# Patient Record
Sex: Female | Born: 1945 | Race: Black or African American | Hispanic: No | Marital: Single | Smoking: Never smoker
Health system: Southern US, Community
[De-identification: ages and names within clinical notes are randomized; demographics above are authoritative.]

## PROBLEM LIST (undated history)

## (undated) DIAGNOSIS — I10 Essential (primary) hypertension: Secondary | ICD-10-CM

## (undated) DIAGNOSIS — R5383 Other fatigue: Secondary | ICD-10-CM

## (undated) DIAGNOSIS — E782 Mixed hyperlipidemia: Secondary | ICD-10-CM

## (undated) DIAGNOSIS — I509 Heart failure, unspecified: Secondary | ICD-10-CM

## (undated) DIAGNOSIS — E669 Obesity, unspecified: Secondary | ICD-10-CM

## (undated) DIAGNOSIS — M810 Age-related osteoporosis without current pathological fracture: Secondary | ICD-10-CM

## (undated) DIAGNOSIS — K922 Gastrointestinal hemorrhage, unspecified: Secondary | ICD-10-CM

## (undated) DIAGNOSIS — N183 Chronic kidney disease, stage 3 (moderate): Secondary | ICD-10-CM

## (undated) DIAGNOSIS — D62 Acute posthemorrhagic anemia: Secondary | ICD-10-CM

## (undated) DIAGNOSIS — E119 Type 2 diabetes mellitus without complications: Secondary | ICD-10-CM

## (undated) DIAGNOSIS — M069 Rheumatoid arthritis, unspecified: Secondary | ICD-10-CM

## (undated) HISTORY — DX: Obesity, unspecified: E66.9

## (undated) HISTORY — DX: Rheumatoid arthritis, unspecified: M06.9

## (undated) HISTORY — DX: Acute posthemorrhagic anemia: D62

## (undated) HISTORY — PX: KNEE SURGERY: SHX244

## (undated) HISTORY — DX: Mixed hyperlipidemia: E78.2

## (undated) HISTORY — DX: Other fatigue: R53.83

## (undated) HISTORY — DX: Essential (primary) hypertension: I10

## (undated) HISTORY — DX: Age-related osteoporosis without current pathological fracture: M81.0

## (undated) HISTORY — DX: Type 2 diabetes mellitus without complications: E11.9

## (undated) HISTORY — DX: Chronic kidney disease, stage 3 (moderate): N18.3

## (undated) HISTORY — DX: Gastrointestinal hemorrhage, unspecified: K92.2

---

## 1997-11-25 ENCOUNTER — Other Ambulatory Visit: Admission: RE | Admit: 1997-11-25 | Discharge: 1997-11-25 | Payer: Self-pay | Admitting: Family Medicine

## 1998-12-18 ENCOUNTER — Other Ambulatory Visit: Admission: RE | Admit: 1998-12-18 | Discharge: 1998-12-18 | Payer: Self-pay | Admitting: Family Medicine

## 2000-02-14 ENCOUNTER — Other Ambulatory Visit: Admission: RE | Admit: 2000-02-14 | Discharge: 2000-02-14 | Payer: Self-pay | Admitting: Family Medicine

## 2001-02-13 ENCOUNTER — Other Ambulatory Visit: Admission: RE | Admit: 2001-02-13 | Discharge: 2001-02-13 | Payer: Self-pay | Admitting: Family Medicine

## 2002-02-17 ENCOUNTER — Other Ambulatory Visit: Admission: RE | Admit: 2002-02-17 | Discharge: 2002-02-17 | Payer: Self-pay | Admitting: Family Medicine

## 2003-03-02 ENCOUNTER — Other Ambulatory Visit: Admission: RE | Admit: 2003-03-02 | Discharge: 2003-03-02 | Payer: Self-pay | Admitting: Family Medicine

## 2004-03-29 ENCOUNTER — Other Ambulatory Visit: Admission: RE | Admit: 2004-03-29 | Discharge: 2004-03-29 | Payer: Self-pay | Admitting: Family Medicine

## 2004-09-26 ENCOUNTER — Ambulatory Visit: Payer: Self-pay | Admitting: Family Medicine

## 2005-04-05 ENCOUNTER — Other Ambulatory Visit: Admission: RE | Admit: 2005-04-05 | Discharge: 2005-04-05 | Payer: Self-pay | Admitting: Family Medicine

## 2005-04-05 ENCOUNTER — Ambulatory Visit: Payer: Self-pay | Admitting: Family Medicine

## 2005-04-16 ENCOUNTER — Ambulatory Visit: Payer: Self-pay | Admitting: Family Medicine

## 2005-06-13 ENCOUNTER — Ambulatory Visit: Payer: Self-pay | Admitting: Family Medicine

## 2005-07-11 ENCOUNTER — Ambulatory Visit: Payer: Self-pay | Admitting: Family Medicine

## 2009-04-06 ENCOUNTER — Encounter (INDEPENDENT_AMBULATORY_CARE_PROVIDER_SITE_OTHER): Payer: Self-pay | Admitting: Oncology

## 2009-04-06 ENCOUNTER — Other Ambulatory Visit: Admission: RE | Admit: 2009-04-06 | Discharge: 2009-04-06 | Payer: Self-pay | Admitting: Oncology

## 2009-05-17 ENCOUNTER — Encounter: Admission: RE | Admit: 2009-05-17 | Discharge: 2009-05-17 | Payer: Self-pay | Admitting: Endocrinology

## 2009-06-30 ENCOUNTER — Ambulatory Visit: Payer: Self-pay

## 2009-07-01 ENCOUNTER — Ambulatory Visit: Payer: Self-pay

## 2009-07-08 ENCOUNTER — Ambulatory Visit: Payer: Self-pay

## 2015-08-16 DIAGNOSIS — M069 Rheumatoid arthritis, unspecified: Secondary | ICD-10-CM

## 2015-08-16 DIAGNOSIS — E782 Mixed hyperlipidemia: Secondary | ICD-10-CM | POA: Insufficient documentation

## 2015-08-16 DIAGNOSIS — I119 Hypertensive heart disease without heart failure: Secondary | ICD-10-CM | POA: Insufficient documentation

## 2015-08-16 DIAGNOSIS — I10 Essential (primary) hypertension: Secondary | ICD-10-CM

## 2015-08-16 DIAGNOSIS — E119 Type 2 diabetes mellitus without complications: Secondary | ICD-10-CM

## 2015-08-16 HISTORY — DX: Rheumatoid arthritis, unspecified: M06.9

## 2015-08-16 HISTORY — DX: Type 2 diabetes mellitus without complications: E11.9

## 2015-08-16 HISTORY — DX: Essential (primary) hypertension: I10

## 2015-08-16 HISTORY — DX: Mixed hyperlipidemia: E78.2

## 2016-02-20 DIAGNOSIS — N183 Chronic kidney disease, stage 3 unspecified: Secondary | ICD-10-CM

## 2016-02-20 DIAGNOSIS — N184 Chronic kidney disease, stage 4 (severe): Secondary | ICD-10-CM | POA: Insufficient documentation

## 2016-02-20 DIAGNOSIS — M810 Age-related osteoporosis without current pathological fracture: Secondary | ICD-10-CM

## 2016-02-20 HISTORY — DX: Chronic kidney disease, stage 3 unspecified: N18.30

## 2016-02-20 HISTORY — DX: Age-related osteoporosis without current pathological fracture: M81.0

## 2017-09-18 DIAGNOSIS — E669 Obesity, unspecified: Secondary | ICD-10-CM

## 2017-09-18 HISTORY — DX: Obesity, unspecified: E66.9

## 2018-05-22 DIAGNOSIS — R5383 Other fatigue: Secondary | ICD-10-CM

## 2018-05-22 HISTORY — DX: Other fatigue: R53.83

## 2018-06-23 DIAGNOSIS — E785 Hyperlipidemia, unspecified: Secondary | ICD-10-CM

## 2018-06-23 DIAGNOSIS — I361 Nonrheumatic tricuspid (valve) insufficiency: Secondary | ICD-10-CM

## 2018-06-23 DIAGNOSIS — I10 Essential (primary) hypertension: Secondary | ICD-10-CM

## 2018-06-23 DIAGNOSIS — J810 Acute pulmonary edema: Secondary | ICD-10-CM

## 2018-06-23 DIAGNOSIS — N184 Chronic kidney disease, stage 4 (severe): Secondary | ICD-10-CM | POA: Diagnosis not present

## 2018-06-23 DIAGNOSIS — M069 Rheumatoid arthritis, unspecified: Secondary | ICD-10-CM | POA: Diagnosis not present

## 2018-06-24 DIAGNOSIS — E785 Hyperlipidemia, unspecified: Secondary | ICD-10-CM | POA: Diagnosis not present

## 2018-06-24 DIAGNOSIS — N184 Chronic kidney disease, stage 4 (severe): Secondary | ICD-10-CM | POA: Diagnosis not present

## 2018-06-24 DIAGNOSIS — I10 Essential (primary) hypertension: Secondary | ICD-10-CM | POA: Diagnosis not present

## 2018-06-24 DIAGNOSIS — M069 Rheumatoid arthritis, unspecified: Secondary | ICD-10-CM | POA: Diagnosis not present

## 2018-06-25 DIAGNOSIS — E785 Hyperlipidemia, unspecified: Secondary | ICD-10-CM | POA: Diagnosis not present

## 2018-06-25 DIAGNOSIS — D72819 Decreased white blood cell count, unspecified: Secondary | ICD-10-CM

## 2018-06-25 DIAGNOSIS — N189 Chronic kidney disease, unspecified: Secondary | ICD-10-CM

## 2018-06-25 DIAGNOSIS — W19XXXA Unspecified fall, initial encounter: Secondary | ICD-10-CM

## 2018-06-25 DIAGNOSIS — I10 Essential (primary) hypertension: Secondary | ICD-10-CM | POA: Diagnosis not present

## 2018-06-25 DIAGNOSIS — N184 Chronic kidney disease, stage 4 (severe): Secondary | ICD-10-CM | POA: Diagnosis not present

## 2018-06-25 DIAGNOSIS — M069 Rheumatoid arthritis, unspecified: Secondary | ICD-10-CM | POA: Diagnosis not present

## 2018-06-26 DIAGNOSIS — R55 Syncope and collapse: Secondary | ICD-10-CM

## 2018-06-26 DIAGNOSIS — N184 Chronic kidney disease, stage 4 (severe): Secondary | ICD-10-CM

## 2018-06-26 DIAGNOSIS — I10 Essential (primary) hypertension: Secondary | ICD-10-CM

## 2018-06-26 DIAGNOSIS — I272 Pulmonary hypertension, unspecified: Secondary | ICD-10-CM

## 2018-06-27 DIAGNOSIS — E785 Hyperlipidemia, unspecified: Secondary | ICD-10-CM

## 2018-06-27 DIAGNOSIS — I10 Essential (primary) hypertension: Secondary | ICD-10-CM | POA: Diagnosis not present

## 2018-06-27 DIAGNOSIS — N184 Chronic kidney disease, stage 4 (severe): Secondary | ICD-10-CM | POA: Diagnosis not present

## 2018-06-27 DIAGNOSIS — R55 Syncope and collapse: Secondary | ICD-10-CM

## 2018-06-27 DIAGNOSIS — I272 Pulmonary hypertension, unspecified: Secondary | ICD-10-CM

## 2018-06-27 DIAGNOSIS — M069 Rheumatoid arthritis, unspecified: Secondary | ICD-10-CM | POA: Diagnosis not present

## 2018-06-27 DIAGNOSIS — I2699 Other pulmonary embolism without acute cor pulmonale: Secondary | ICD-10-CM

## 2018-06-28 DIAGNOSIS — I10 Essential (primary) hypertension: Secondary | ICD-10-CM | POA: Diagnosis not present

## 2018-06-28 DIAGNOSIS — M069 Rheumatoid arthritis, unspecified: Secondary | ICD-10-CM | POA: Diagnosis not present

## 2018-06-28 DIAGNOSIS — N184 Chronic kidney disease, stage 4 (severe): Secondary | ICD-10-CM | POA: Diagnosis not present

## 2018-06-28 DIAGNOSIS — E785 Hyperlipidemia, unspecified: Secondary | ICD-10-CM | POA: Diagnosis not present

## 2018-07-04 ENCOUNTER — Inpatient Hospital Stay (HOSPITAL_COMMUNITY)
Admission: AD | Admit: 2018-07-04 | Discharge: 2018-07-14 | DRG: 377 | Disposition: A | Discharge status: Skilled Nursing Facility | Payer: Medicare Other | Source: Other Acute Inpatient Hospital | Attending: Internal Medicine | Admitting: Internal Medicine

## 2018-07-04 ENCOUNTER — Other Ambulatory Visit: Payer: Self-pay

## 2018-07-04 ENCOUNTER — Inpatient Hospital Stay (HOSPITAL_COMMUNITY): Payer: Medicare Other

## 2018-07-04 DIAGNOSIS — R195 Other fecal abnormalities: Secondary | ICD-10-CM | POA: Diagnosis present

## 2018-07-04 DIAGNOSIS — R0989 Other specified symptoms and signs involving the circulatory and respiratory systems: Secondary | ICD-10-CM | POA: Diagnosis not present

## 2018-07-04 DIAGNOSIS — N184 Chronic kidney disease, stage 4 (severe): Secondary | ICD-10-CM | POA: Diagnosis present

## 2018-07-04 DIAGNOSIS — K5909 Other constipation: Secondary | ICD-10-CM | POA: Diagnosis present

## 2018-07-04 DIAGNOSIS — J9601 Acute respiratory failure with hypoxia: Secondary | ICD-10-CM | POA: Diagnosis not present

## 2018-07-04 DIAGNOSIS — I459 Conduction disorder, unspecified: Secondary | ICD-10-CM | POA: Diagnosis not present

## 2018-07-04 DIAGNOSIS — Z8249 Family history of ischemic heart disease and other diseases of the circulatory system: Secondary | ICD-10-CM | POA: Diagnosis not present

## 2018-07-04 DIAGNOSIS — E872 Acidosis: Secondary | ICD-10-CM | POA: Diagnosis not present

## 2018-07-04 DIAGNOSIS — R269 Unspecified abnormalities of gait and mobility: Secondary | ICD-10-CM | POA: Diagnosis not present

## 2018-07-04 DIAGNOSIS — J189 Pneumonia, unspecified organism: Secondary | ICD-10-CM | POA: Diagnosis present

## 2018-07-04 DIAGNOSIS — Z794 Long term (current) use of insulin: Secondary | ICD-10-CM

## 2018-07-04 DIAGNOSIS — R21 Rash and other nonspecific skin eruption: Secondary | ICD-10-CM | POA: Diagnosis not present

## 2018-07-04 DIAGNOSIS — Z888 Allergy status to other drugs, medicaments and biological substances status: Secondary | ICD-10-CM

## 2018-07-04 DIAGNOSIS — I480 Paroxysmal atrial fibrillation: Secondary | ICD-10-CM | POA: Diagnosis not present

## 2018-07-04 DIAGNOSIS — Z7901 Long term (current) use of anticoagulants: Secondary | ICD-10-CM

## 2018-07-04 DIAGNOSIS — Y92239 Unspecified place in hospital as the place of occurrence of the external cause: Secondary | ICD-10-CM | POA: Diagnosis not present

## 2018-07-04 DIAGNOSIS — E1122 Type 2 diabetes mellitus with diabetic chronic kidney disease: Secondary | ICD-10-CM | POA: Diagnosis present

## 2018-07-04 DIAGNOSIS — M79605 Pain in left leg: Secondary | ICD-10-CM | POA: Diagnosis not present

## 2018-07-04 DIAGNOSIS — I130 Hypertensive heart and chronic kidney disease with heart failure and stage 1 through stage 4 chronic kidney disease, or unspecified chronic kidney disease: Secondary | ICD-10-CM | POA: Diagnosis present

## 2018-07-04 DIAGNOSIS — R001 Bradycardia, unspecified: Secondary | ICD-10-CM | POA: Diagnosis not present

## 2018-07-04 DIAGNOSIS — T502X5A Adverse effect of carbonic-anhydrase inhibitors, benzothiadiazides and other diuretics, initial encounter: Secondary | ICD-10-CM | POA: Diagnosis not present

## 2018-07-04 DIAGNOSIS — I248 Other forms of acute ischemic heart disease: Secondary | ICD-10-CM | POA: Diagnosis present

## 2018-07-04 DIAGNOSIS — K922 Gastrointestinal hemorrhage, unspecified: Secondary | ICD-10-CM | POA: Diagnosis present

## 2018-07-04 DIAGNOSIS — D62 Acute posthemorrhagic anemia: Secondary | ICD-10-CM | POA: Diagnosis present

## 2018-07-04 DIAGNOSIS — Z6837 Body mass index (BMI) 37.0-37.9, adult: Secondary | ICD-10-CM

## 2018-07-04 DIAGNOSIS — I370 Nonrheumatic pulmonary valve stenosis: Secondary | ICD-10-CM | POA: Diagnosis not present

## 2018-07-04 DIAGNOSIS — R5381 Other malaise: Secondary | ICD-10-CM | POA: Diagnosis not present

## 2018-07-04 DIAGNOSIS — K274 Chronic or unspecified peptic ulcer, site unspecified, with hemorrhage: Secondary | ICD-10-CM | POA: Diagnosis not present

## 2018-07-04 DIAGNOSIS — M79604 Pain in right leg: Secondary | ICD-10-CM | POA: Diagnosis present

## 2018-07-04 DIAGNOSIS — Z79899 Other long term (current) drug therapy: Secondary | ICD-10-CM

## 2018-07-04 DIAGNOSIS — I361 Nonrheumatic tricuspid (valve) insufficiency: Secondary | ICD-10-CM | POA: Diagnosis not present

## 2018-07-04 DIAGNOSIS — E876 Hypokalemia: Secondary | ICD-10-CM | POA: Diagnosis not present

## 2018-07-04 DIAGNOSIS — Z9181 History of falling: Secondary | ICD-10-CM | POA: Diagnosis not present

## 2018-07-04 DIAGNOSIS — R0603 Acute respiratory distress: Secondary | ICD-10-CM

## 2018-07-04 DIAGNOSIS — I340 Nonrheumatic mitral (valve) insufficiency: Secondary | ICD-10-CM | POA: Diagnosis not present

## 2018-07-04 DIAGNOSIS — I272 Pulmonary hypertension, unspecified: Secondary | ICD-10-CM | POA: Diagnosis present

## 2018-07-04 DIAGNOSIS — Y95 Nosocomial condition: Secondary | ICD-10-CM | POA: Diagnosis not present

## 2018-07-04 DIAGNOSIS — N179 Acute kidney failure, unspecified: Secondary | ICD-10-CM | POA: Diagnosis present

## 2018-07-04 DIAGNOSIS — K264 Chronic or unspecified duodenal ulcer with hemorrhage: Secondary | ICD-10-CM | POA: Diagnosis present

## 2018-07-04 DIAGNOSIS — M069 Rheumatoid arthritis, unspecified: Secondary | ICD-10-CM | POA: Diagnosis not present

## 2018-07-04 DIAGNOSIS — Z66 Do not resuscitate: Secondary | ICD-10-CM | POA: Diagnosis not present

## 2018-07-04 DIAGNOSIS — T50905A Adverse effect of unspecified drugs, medicaments and biological substances, initial encounter: Secondary | ICD-10-CM | POA: Diagnosis not present

## 2018-07-04 DIAGNOSIS — I5033 Acute on chronic diastolic (congestive) heart failure: Secondary | ICD-10-CM | POA: Diagnosis present

## 2018-07-04 DIAGNOSIS — R079 Chest pain, unspecified: Secondary | ICD-10-CM

## 2018-07-04 HISTORY — DX: Heart failure, unspecified: I50.9

## 2018-07-04 LAB — CBC WITH DIFFERENTIAL/PLATELET
Abs Immature Granulocytes: 0.07 10*3/uL (ref 0.00–0.07)
Basophils Absolute: 0 10*3/uL (ref 0.0–0.1)
Basophils Relative: 0 %
Eosinophils Absolute: 0.4 10*3/uL (ref 0.0–0.5)
Eosinophils Relative: 4 %
HCT: 23.6 % — ABNORMAL LOW (ref 36.0–46.0)
Hemoglobin: 7.3 g/dL — ABNORMAL LOW (ref 12.0–15.0)
Immature Granulocytes: 1 %
Lymphocytes Relative: 6 %
Lymphs Abs: 0.7 10*3/uL (ref 0.7–4.0)
MCH: 27.8 pg (ref 26.0–34.0)
MCHC: 30.9 g/dL (ref 30.0–36.0)
MCV: 89.7 fL (ref 80.0–100.0)
MONO ABS: 0.5 10*3/uL (ref 0.1–1.0)
Monocytes Relative: 4 %
NEUTROS ABS: 11 10*3/uL — AB (ref 1.7–7.7)
Neutrophils Relative %: 85 %
Platelets: 214 10*3/uL (ref 150–400)
RBC: 2.63 MIL/uL — ABNORMAL LOW (ref 3.87–5.11)
RDW: 19.8 % — ABNORMAL HIGH (ref 11.5–15.5)
WBC: 12.7 10*3/uL — ABNORMAL HIGH (ref 4.0–10.5)
nRBC: 0.9 % — ABNORMAL HIGH (ref 0.0–0.2)

## 2018-07-04 LAB — COMPREHENSIVE METABOLIC PANEL
ALK PHOS: 33 U/L — AB (ref 38–126)
ALT: 19 U/L (ref 0–44)
ANION GAP: 10 (ref 5–15)
AST: 30 U/L (ref 15–41)
Albumin: 2.6 g/dL — ABNORMAL LOW (ref 3.5–5.0)
BILIRUBIN TOTAL: 1.6 mg/dL — AB (ref 0.3–1.2)
BUN: 45 mg/dL — ABNORMAL HIGH (ref 8–23)
CO2: 23 mmol/L (ref 22–32)
CREATININE: 2.46 mg/dL — AB (ref 0.44–1.00)
Calcium: 8.1 mg/dL — ABNORMAL LOW (ref 8.9–10.3)
Chloride: 104 mmol/L (ref 98–111)
GFR calc non Af Amer: 19 mL/min — ABNORMAL LOW (ref >60)
GFR, EST AFRICAN AMERICAN: 22 mL/min — AB (ref >60)
Glucose, Bld: 164 mg/dL — ABNORMAL HIGH (ref 70–99)
Potassium: 3.8 mmol/L (ref 3.5–5.1)
Sodium: 137 mmol/L (ref 135–145)
TOTAL PROTEIN: 5.9 g/dL — AB (ref 6.5–8.1)

## 2018-07-04 LAB — TROPONIN I
TROPONIN I: 0.5 ng/mL — AB (ref <0.03)
Troponin I: 0.4 ng/mL (ref <0.03)
Troponin I: 0.48 ng/mL (ref <0.03)

## 2018-07-04 LAB — CBC
HEMATOCRIT: 22.4 % — AB (ref 36.0–46.0)
HEMOGLOBIN: 7.4 g/dL — AB (ref 12.0–15.0)
MCH: 29.4 pg (ref 26.0–34.0)
MCHC: 33 g/dL (ref 30.0–36.0)
MCV: 88.9 fL (ref 80.0–100.0)
Platelets: 232 10*3/uL (ref 150–400)
RBC: 2.52 MIL/uL — AB (ref 3.87–5.11)
RDW: 18.7 % — ABNORMAL HIGH (ref 11.5–15.5)
WBC: 14.1 10*3/uL — ABNORMAL HIGH (ref 4.0–10.5)
nRBC: 0.9 % — ABNORMAL HIGH (ref 0.0–0.2)

## 2018-07-04 LAB — PROCALCITONIN: PROCALCITONIN: 0.23 ng/mL

## 2018-07-04 MED ORDER — CLONIDINE HCL 0.2 MG PO TABS: 0.2000 mg | ORAL_TABLET | Freq: Two times a day (BID) | ORAL | Status: DC

## 2018-07-04 MED ORDER — CLONIDINE HCL 0.1 MG PO TABS
0.1000 mg | ORAL_TABLET | Freq: Two times a day (BID) | ORAL | Status: DC
  Filled 2018-07-04: qty 1

## 2018-07-04 MED ORDER — SODIUM CHLORIDE 3 % IN NEBU
4.0000 mL | INHALATION_SOLUTION | Freq: Three times a day (TID) | RESPIRATORY_TRACT | Status: DC
  Administered 2018-07-05 – 2018-07-08 (×9): 4 mL via RESPIRATORY_TRACT
  Filled 2018-07-04 (×12): qty 4

## 2018-07-04 MED ORDER — ONDANSETRON HCL 4 MG/2ML IJ SOLN
4.0000 mg | Freq: Four times a day (QID) | INTRAMUSCULAR | Status: DC | PRN
Start: 2018-07-04 — End: 2018-07-14

## 2018-07-04 MED ORDER — SODIUM CHLORIDE 3 % IN NEBU
4.0000 mL | INHALATION_SOLUTION | Freq: Three times a day (TID) | RESPIRATORY_TRACT | Status: DC
  Filled 2018-07-04 (×2): qty 4

## 2018-07-04 MED ORDER — ACETAMINOPHEN 325 MG PO TABS: 650.0000 mg | ORAL_TABLET | Freq: Four times a day (QID) | ORAL | Status: DC | PRN

## 2018-07-04 MED ORDER — SODIUM CHLORIDE 0.9 % IV SOLN
2.0000 g | Freq: Once | INTRAVENOUS | Status: AC
  Administered 2018-07-05: 2 g via INTRAVENOUS
  Filled 2018-07-04: qty 2

## 2018-07-04 MED ORDER — SODIUM CHLORIDE 0.9 % IV SOLN
80.0000 mg | Freq: Two times a day (BID) | INTRAVENOUS | Status: DC
  Administered 2018-07-04 – 2018-07-11 (×15): 80 mg via INTRAVENOUS
  Filled 2018-07-04 (×17): qty 80

## 2018-07-04 MED ORDER — IPRATROPIUM-ALBUTEROL 0.5-2.5 (3) MG/3ML IN SOLN
3.0000 mL | Freq: Four times a day (QID) | RESPIRATORY_TRACT | Status: DC
  Administered 2018-07-04: 3 mL via RESPIRATORY_TRACT
  Filled 2018-07-04: qty 3

## 2018-07-04 MED ORDER — DM-GUAIFENESIN ER 30-600 MG PO TB12
2.0000 | ORAL_TABLET | Freq: Two times a day (BID) | ORAL | Status: DC
  Administered 2018-07-05 – 2018-07-14 (×16): 2 via ORAL
  Filled 2018-07-04: qty 2
  Filled 2018-07-04 (×3): qty 1
  Filled 2018-07-04 (×2): qty 2
  Filled 2018-07-04: qty 1
  Filled 2018-07-04 (×5): qty 2
  Filled 2018-07-04: qty 1
  Filled 2018-07-04 (×2): qty 2
  Filled 2018-07-04: qty 1
  Filled 2018-07-04 (×3): qty 2

## 2018-07-04 MED ORDER — SODIUM CHLORIDE 0.9 % IV SOLN
1.0000 g | INTRAVENOUS | Status: DC
  Administered 2018-07-05 – 2018-07-09 (×5): 1 g via INTRAVENOUS
  Filled 2018-07-04 (×5): qty 1

## 2018-07-04 MED ORDER — IPRATROPIUM-ALBUTEROL 0.5-2.5 (3) MG/3ML IN SOLN
3.0000 mL | Freq: Three times a day (TID) | RESPIRATORY_TRACT | Status: DC
  Administered 2018-07-05 – 2018-07-08 (×9): 3 mL via RESPIRATORY_TRACT
  Filled 2018-07-04 (×9): qty 3

## 2018-07-04 NOTE — Consult Note (Signed)
Referring Physician: Dr. Lenna Sciara Mann/C. Nevada Crane, MD  Jade Mclean is an 72 y.o. adult.                       Chief Complaint: Symptomatic anemia in patient with recent PE  HPI: 72 year old female had recently diagnosed PE and was treated with Eliquis. She had lower GI bleed with low Hgb of 5 g/dL. She has PMH of HTN, CKD, IV, type 2 DM, morbid obesity and congestive heart failure. She is awake and oriented on floor here. Her vital signs are stable. Her repeat EKG shows normal QTc.   No past medical history on file.    The histories are not reviewed yet. Please review them in the "History" navigator section and refresh this [place].  No family history on file. Social History:  has no history on file for tobacco, alcohol, and drug.  Allergies:  Allergies  Allergen Reactions  . Colesevelam     Other reaction(s): GI Upset (intolerance)  . Dulaglutide     Other reaction(s): GI Upset (intolerance)  . Rosuvastatin     Other reaction(s): Myalgias (intolerance)  . Simvastatin     Other reaction(s): Myalgias (intolerance)    Medications Prior to Admission  Medication Sig Dispense Refill  . apixaban (ELIQUIS) 5 MG TABS tablet Take 10 mg by mouth 2 (two) times daily.     . Calcium Carbonate-Vitamin D 600-400 MG-UNIT chew tablet Chew 1 tablet by mouth daily.    . carvedilol (COREG) 25 MG tablet Take 25 mg by mouth 2 (two) times daily.    . cloNIDine (CATAPRES) 0.2 MG tablet Take 0.2 mg by mouth every 12 (twelve) hours.    Marland Kitchen etanercept (ENBREL) 25 MG injection Inject 25 mg into the skin every Friday.    . ezetimibe (ZETIA) 10 MG tablet Take 10 mg by mouth daily.    . insulin degludec (TRESIBA) 100 UNIT/ML SOPN FlexTouch Pen Inject 12 Units into the skin daily.     . irbesartan (AVAPRO) 300 MG tablet Take 300 mg by mouth daily.    . Multiple Vitamin (MULTIVITAMIN WITH MINERALS) TABS tablet Take 1 tablet by mouth daily.    Marland Kitchen NIFEdipine (PROCARDIA XL/NIFEDICAL XL) 60 MG 24 hr tablet Take 60 mg by  mouth 2 (two) times daily.    Marland Kitchen amoxicillin-clavulanate (AUGMENTIN) 500-125 MG tablet Take 1 tablet by mouth 2 (two) times daily with a meal. For 3 days      Results for orders placed or performed during the hospital encounter of 07/04/18 (from the past 48 hour(s))  CBC     Status: Abnormal   Collection Time: 07/04/18 11:02 AM  Result Value Ref Range   WBC 14.1 (H) 4.0 - 10.5 K/uL   RBC 2.52 (L) 3.87 - 5.11 MIL/uL   Hemoglobin 7.4 (L) 12.0 - 15.0 g/dL   HCT 22.4 (L) 36.0 - 46.0 %   MCV 88.9 80.0 - 100.0 fL   MCH 29.4 26.0 - 34.0 pg   MCHC 33.0 30.0 - 36.0 g/dL   RDW 18.7 (H) 11.5 - 15.5 %   Platelets 232 150 - 400 K/uL   nRBC 0.9 (H) 0.0 - 0.2 %    Comment: Performed at [facility], [address] [place], [place] [address]  Comprehensive metabolic panel     Status: Abnormal   Collection Time: 07/04/18 11:02 AM  Result Value Ref Range   Sodium 137 135 - 145 mmol/L   Potassium  3.8 3.5 - 5.1 mmol/L   Chloride 104 98 - 111 mmol/L   CO2 23 22 - 32 mmol/L   Glucose, Bld 164 (H) 70 - 99 mg/dL   BUN 45 (H) 8 - 23 mg/dL   Creatinine, Ser 2.46 (H) 0.44 - 1.00 mg/dL   Calcium 8.1 (L) 8.9 - 10.3 mg/dL   Total Protein 5.9 (L) 6.5 - 8.1 g/dL   Albumin 2.6 (L) 3.5 - 5.0 g/dL   AST 30 15 - 41 U/L   ALT 19 0 - 44 U/L   Alkaline Phosphatase 33 (L) 38 - 126 U/L   Total Bilirubin 1.6 (H) 0.3 - 1.2 mg/dL   GFR calc non Af Amer 19 (L) >60 mL/min   GFR calc Af Amer 22 (L) >60 mL/min   Anion gap 10 5 - 15    Comment: Performed at [facility], [address] [place], [place] [address]  Troponin I - Now Then Q6H     Status: Abnormal   Collection Time: 07/04/18 11:02 AM  Result Value Ref Range   Troponin I 0.40 (HH) <0.03 ng/mL    Comment: CRITICAL RESULT CALLED TO, READ BACK BY AND VERIFIED WITH: A.RIO,RN @ 1216 07/04/2018 WEBBERJ Performed at [facility], [place] [address] [place], [place] [address]   Troponin I - Now Then Q6H     Status: Abnormal   Collection Time:  07/04/18  4:25 PM  Result Value Ref Range   Troponin I 0.50 (HH) <0.03 ng/mL    Comment: CRITICAL VALUE NOTED.  VALUE IS CONSISTENT WITH PREVIOUSLY REPORTED AND CALLED VALUE. Performed at [facility], [place] [address], [place], [place] [address]   Procalcitonin - Baseline     Status: None   Collection Time: 07/04/18  6:16 PM  Result Value Ref Range   Procalcitonin 0.23 ng/mL    Comment:        Interpretation: PCT (Procalcitonin) <= 0.5 ng/mL: Systemic infection (sepsis) is not likely. Local bacterial infection is possible. (NOTE)       Sepsis PCT Algorithm           Lower Respiratory Tract                                      Infection PCT Algorithm    ----------------------------     ----------------------------         PCT < 0.25 ng/mL                PCT < 0.10 ng/mL         Strongly encourage             Strongly discourage   discontinuation of antibiotics    initiation of antibiotics    ----------------------------     -----------------------------       PCT 0.25 - 0.50 ng/mL            PCT 0.10 - 0.25 ng/mL               OR       >80% decrease in PCT            Discourage initiation of  antibiotics      Encourage discontinuation           of antibiotics    ----------------------------     -----------------------------         PCT >= 0.50 ng/mL              PCT 0.26 - 0.50 ng/mL               AND        <80% decrease in PCT             Encourage initiation of                                             antibiotics       Encourage continuation           of antibiotics    ----------------------------     -----------------------------        PCT >= 0.50 ng/mL                  PCT > 0.50 ng/mL               AND         increase in PCT                  Strongly encourage                                      initiation of antibiotics    Strongly encourage escalation           of antibiotics                                      -----------------------------                                           PCT <= 0.25 ng/mL                                                 OR                                        > 80% decrease in PCT                                     Discontinue / Do not initiate                                             antibiotics Performed at [facility], [place] [address] [place], [place] [address]    Dg Chest Port 1 View  Result Date: 07/04/2018 CLINICAL DATA:  Chest pain EXAM: PORTABLE CHEST 1 VIEW COMPARISON:  06/26/2018 FINDINGS: The patient is tilted to the left. Obscuration of the left hemidiaphragm and portions of the left heart border from presumed left pleural effusion is noted. Probable atelectasis at the left lung base as a result. Mild vascular congestion is seen. Aortic atherosclerosis without aneurysm is identified. IMPRESSION: Obscuration of the left hemidiaphragm and portions of the left heart border from presumed small to moderate left pleural effusion. Probable atelectasis at the left lung base. Mild vascular congestion. Electronically Signed   By: Ashley Royalty M.D.   On: 07/04/2018 22:46    Review Of Systems Constitutional: No fever, chills, weight loss or gain. Eyes: No vision change, wears glasses. No discharge or pain. Ears: No hearing loss, No tinnitus. Respiratory: No asthma, COPD, pneumonias. Positive shortness of breath. No hemoptysis. Cardiovascular: Positive chest pain, palpitation, leg edema. Gastrointestinal: No nausea, vomiting, diarrhea, constipation. No GI bleed. No hepatitis. Genitourinary: No dysuria, hematuria, kidney stone. No incontinance. Neurological: No headache, stroke, seizures.  Psychiatry: No psych facility admission for anxiety, depression, suicide. No detox. Skin: No rash. Musculoskeletal: Positive joint pain, no fibromyalgia. No neck pain, back pain. Lymphadenopathy: No lymphadenopathy. Hematology: Positive anemia. No easy bruising.   Blood  pressure (!) 160/51, pulse 69, temperature 98.7 F (37.1 C), temperature source Oral, resp. rate 17, SpO2 97 %. There is no height or weight on file to calculate BMI. General appearance: alert, cooperative, appears stated age and no distress Head: Normocephalic, atraumatic. Eyes: Brown eyes, pale conjunctiva, corneas clear.  Neck: No adenopathy, no carotid bruit, no JVD, supple, symmetrical, trachea midline and thyroid not enlarged. Resp: Basal crackles to auscultation bilaterally. Cardio: Regular rate and rhythm, S1, S2 normal, II/VI systolic murmur, no click, rub or gallop GI: Soft, non-tender; bowel sounds normal; no organomegaly. Extremities: Trace edema, no cyanosis or clubbing. Skin: Warm and dry.  Neurologic: Alert and oriented X 3, normal strength.   Assessment/Plan Acute lower GI bleed Symptomatic anemia from above Abnormal troponin from demand ischemia H/O recent PE Hypertension Type 2 DM CKD, 4 H/O diastolic left heart failure Rheumatoid arthritis  With stable vital signs and normal QTc on repeat EKG patient may undergo GI evaluation. Hold Eliquis till GI evaluation. Continue Clonidine PO or patch.  Birdie [place], MD  07/04/2018, 11:00 PM

## 2018-07-04 NOTE — Consult Note (Addendum)
CROSS COVER [facility]-GI Reason for Consult: Severe anemia with a hemoglobin of 5 g/dL Referring Physician: THP  Jade Mclean is an 72 y.o. female.  HPI: Ms. Jade Mclean is a 72 year old black female with multiple medical medical problems including a recent diagnosis of pulmonary embolism requiring anticoagulation for which she's on Eliquis, chronic kidney disease stage IV, type 2 diabetes mellitus & hypertension. Patient was seen in the [facility] ED for extreme fatigue after a fall on 06/22/2017 when she was diagnosed with pulmonary embolus and new congestive heart failure and chronic kidney disease. She was discharged from the hospital on 06/28/2018 and readmitted after her family found her to be very fatigued and somnolent. Patient is not able to give much history and seems very sleepy and exhausted. She was taken back to the emergency room last night where she was found to have a hemoglobin of 5 g/dL patient was then transferred to [facility] for further evaluation and treatment she received 2 units of packed red blood cells there is no known history of melena or hematochezia no known history of nonsteroidal use. She's had some chest congestion and nonproductive cough recently without any fever chills or rigors. Chest x-rays revealed moderate left pleural effusion and probable atelectasis at the left lung base with mild vascular congestion.   PAST MEDICAL HISTORY 1) Congestive heart failure 2) Chronic kidney disease stage IV. 3) Type 2 diabetes mellitus. 4) Hypertension 5) Morbid obesity.  No family history on file.  Social History:  has no history on file for tobacco, alcohol, and drug.  Allergies:  Allergies  Allergen Reactions  . Colesevelam     Other reaction(s): GI Upset (intolerance)  . Dulaglutide     Other reaction(s): GI Upset (intolerance)  . Rosuvastatin     Other reaction(s): Myalgias (intolerance)  . Simvastatin     Other reaction(s): Myalgias (intolerance)   Medications:  I have reviewed the patient's current medications.  Results for orders placed or performed during the hospital encounter of 07/04/18 (from the past 48 hour(s))  CBC     Status: Abnormal   Collection Time: 07/04/18 11:02 AM  Result Value Ref Range   WBC 14.1 (H) 4.0 - 10.5 K/uL   RBC 2.52 (L) 3.87 - 5.11 MIL/uL   Hemoglobin 7.4 (L) 12.0 - 15.0 g/dL   HCT 22.4 (L) 36.0 - 46.0 %   MCV 88.9 80.0 - 100.0 fL   MCH 29.4 26.0 - 34.0 pg   MCHC 33.0 30.0 - 36.0 g/dL   RDW 18.7 (H) 11.5 - 15.5 %   Platelets 232 150 - 400 K/uL   nRBC 0.9 (H) 0.0 - 0.2 %    Comment: Performed at [facility], [address], [place], [place] [address]  Comprehensive metabolic panel     Status: Abnormal   Collection Time: 07/04/18 11:02 AM  Result Value Ref Range   Sodium 137 135 - 145 mmol/L   Potassium 3.8 3.5 - 5.1 mmol/L   Chloride 104 98 - 111 mmol/L   CO2 23 22 - 32 mmol/L   Glucose, Bld 164 (H) 70 - 99 mg/dL   BUN 45 (H) 8 - 23 mg/dL   Creatinine, Ser 2.46 (H) 0.44 - 1.00 mg/dL   Calcium 8.1 (L) 8.9 - 10.3 mg/dL   Total Protein 5.9 (L) 6.5 - 8.1 g/dL   Albumin 2.6 (L) 3.5 - 5.0 g/dL   AST 30 15 - 41 U/L   ALT 19 0 - 44 U/L  Alkaline Phosphatase 33 (L) 38 - 126 U/L   Total Bilirubin 1.6 (H) 0.3 - 1.2 mg/dL   GFR calc non Af Amer 19 (L) >60 mL/min   GFR calc Af Amer 22 (L) >60 mL/min   Anion gap 10 5 - 15    Comment: Performed at [place] [address] [place], [place] [address]  Troponin I - Now Then Q6H     Status: Abnormal   Collection Time: 07/04/18 11:02 AM  Result Value Ref Range   Troponin I 0.40 (HH) <0.03 ng/mL    Comment: CRITICAL RESULT CALLED TO, READ BACK BY AND VERIFIED WITH: A.RIO,RN @ 1216 07/04/2018 WEBBERJ Performed at [facility], [place] [address] [place], [place] [address]   No results found.  Review of Systems  Unable to perform ROS: Acuity of condition   There were no vitals taken for this visit. Physical Exam  Constitutional: He appears  well-developed and well-nourished. He appears listless.  HENT:  Head: Normocephalic and atraumatic.  Eyes: EOM are normal.  Neck: Neck supple.  Cardiovascular: Regular rhythm.  Respiratory: Effort normal.  GI: Soft. Bowel sounds are normal. He exhibits no distension. There is no abdominal tenderness. There is no rebound and no guarding.  Neurological: He appears listless. He is disoriented.  Skin: Skin is warm and dry.   Assessment/Plan: 1) Severe anemia with guaiac-positive stools-as the patient had a recent PE-we will need a cardiology consult ASAP. I discussed this with Dr. Nevada Mclean when she called about this consult. I feel if we have to do any aute interventions from a GI standpoint at this time. patient will need to repeat chest x-rays, antibiotics and cardiology clearance. I will try to get in touch with the tried hospitalist on call so that we can help facilitate a cardiac evaluation as soon as possible.  2) Acute pulmonary embolus diagnosed recently. 3) Congestive heart failure. 4) Stage IV chronic kidney disease. 5) AODM. 6) HTN. 7) Morbid obesity. TRUE [name] 07/04/2018, 1:36 PM

## 2018-07-04 NOTE — Progress Notes (Signed)
Received a phone call from Dr. Collene Mares with concerns about pt status. Informed that pt was lethargic and had notable decline in mental and physical status. On bedside evaluation, pt is A/O x4. She denies chest pain/discomfort, N/V/D, SOB, Pt states that she feels her condition has improved since admission. Will continue to monitor.Durward Fortes to page Cardiology on call per GI request. Advised RN to call if condition changes/declines.  GI Bleed -CBC  Chest Pain - Pt did not complain of CP at time of evaluation but Troponin, Chest Xray, EKG and CBC ordered to r/o previous cardiac event - GI paged and consult placed per GI recommendation.   Lovey Newcomer, NP [facility] 7p-7a [phone]

## 2018-07-04 NOTE — H&P (Addendum)
History and Physical  Joretta Eads KVQ:[id number] DOB: 09-03-1945 DOA: 07/04/2018  Referring physician: Dr Alcario Drought  PCP: Patient, No Pcp Per  Outpatient Specialists: Cardiology Patient coming from: Home Chief Complaint: Generalized weakness  HPI: Jade Mclean is a 72 y.o. female with past medical history significant for recently diagnosed pulmonary embolism on Eliquis and unspecified CHF, CKD 4, type 2 diabetes, hypertension, who presented from home last night via EMS to [facility] ED due to extreme fatigue and somnolence as noted by family members.  84 of history is obtained from her godchild who lives with her.  Patient was recently admitted at [facility] on 12/16 after falling.  During that admission she was diagnosed with acute pulmonary embolism, new congestive heart failure, and CKD 4.  Was released on the 12/22 with home health PT.  After her discharge she gradually became more fatigued and somnolent.  Had diarrhea for couple of days.  Last night she complained of her legs hurting and family noted that she was more somnolent than usual and called 911.  She was transported to [facility] ED where she was found to have a hemoglobin of 5 and a positive FOBT.  No GI services at [facility] at that time therefore the patient was transferred to [facility] for possible GI evaluation.  Patient received 2 units of PRBCs.  CBC ordered and pending.  Denies melena or hematochezia.  Denies use of NSAIDs.  Patient reports chest congestion and intermittent nonproductive cough.  Denies fevers or chills.   Lab studies done at [facility] remarkable for hemoglobin 5.2, MCV 90, troponin 0.29, creatinine 2.30, WBC 12.4, BNP greater than 17,000.  Independently reviewed twelve-lead EKG done on 07/04/2018 at [facility] which revealed sinus rhythm with rate of 65, nonspecific ST-T changes, and QTC of 715.  Chest x-ray preliminary report reveals patchy consolidation bilaterally and possible  developing pneumonia.  GI has been consulted.  Dr. Collene Mares will see the patient.  Will hold off anticoagulation and consult cardiology for medical clearance as recommended by GI.   Review of Systems: Review of systems as noted in the HPI. All other systems reviewed and are negative.  Family History: Brother with coronary artery disease.  Had MI at the age of 39.  Social History: Denies use of alcohol, tobacco or illicit drugs.  Uses a cane and a walker in order to ambulate.   Prior to Admission medications   Not on File    Physical Exam: There were no vitals taken for this visit.  . General: 72 y.o. year-old female well developed well nourished.  Weak appearing.  Alert and oriented x3. . Cardiovascular: Regular rate and rhythm with no rubs or gallops.  No thyromegaly or JVD noted.  No lower extremity edema. 2/4 pulses in all 4 extremities. Marland Kitchen Respiratory: Mild rales at bases with no wheezes.  Poor inspiratory effort. . Abdomen: Soft, tender at epigastric area.  Nondistended with normal bowel sounds x4 quadrants. . Muskuloskeletal: No cyanosis, clubbing or edema noted bilaterally . Neuro: CN II-XII intact, strength, sensation, reflexes . Skin: No ulcerative lesions noted or rashes . Psychiatry: Judgement and insight appear normal. Mood is appropriate for condition and setting.          Labs on Admission:  Basic Metabolic Panel: No results for input(s): NA, K, CL, CO2, GLUCOSE, BUN, CREATININE, CALCIUM, MG, PHOS in the last 168 hours. Liver Function Tests: No results for input(s): AST, ALT, ALKPHOS, BILITOT, PROT, ALBUMIN in the last 168 hours. No results for  input(s): LIPASE, AMYLASE in the last 168 hours. No results for input(s): AMMONIA in the last 168 hours. CBC: No results for input(s): WBC, NEUTROABS, HGB, HCT, MCV, PLT in the last 168 hours. Cardiac Enzymes: No results for input(s): CKTOTAL, CKMB, CKMBINDEX, TROPONINI in the last 168 hours.  BNP (last 3 results) No  results for input(s): BNP in the last 8760 hours.  ProBNP (last 3 results) No results for input(s): PROBNP in the last 8760 hours.  CBG: No results for input(s): GLUCAP in the last 168 hours.  Radiological Exams on Admission: No results found.  EKG: I independently viewed the EKG done and my findings are as followed: Sinus rhythm rate of 65 with no specific ST-T changes.  QTc 715.  Assessment/Plan Present on Admission: **None**  Active Problems:   * No active hospital problems. *  Severe symptomatic anemia secondary to suspected upper GI bleed Hemoglobin at [facility] 5.2 with positive FOBT Post 2 unit PRBCs transfusion Repeat CBC Start IV Protonix 80 mg twice daily Hold Eliquis N.p.o. until evaluated by GI GI consult.  Discussed with Dr. Collene Mares.  Elevated troponin Possibly demand ischemia from severe anemia and recent PE Initial troponin at [facility] 0.29 Cycle troponin every 6 hours x 3 Twelve-lead EKG reveals sinus rhythm with a rate of 65 with no specific ST-T changes Continue to monitor closely on telemetry  Bilateral lower extremity pain Obtain lower extremity duplex ultrasound to rule out DVT  Leukocytosis Unclear if reactive or active infective process Reports chest congestion Per chest x-ray preliminary report patchy consolidation bilaterally with possible developing pneumonia Obtain procalcitonin if positive start IV antibiotics; afebrile Lactic acid 1.2 Repeat CBC in the morning Monitor for fever  QTC prolongation QTC from twelve-lead EKG done today 715 Repeat twelve-lead EKG Avoid QTC prolonging agents  Recently diagnosed unspecified CHF BNP elevated greater than 17,000 We will need to obtain last 2D echo records from [facility] Cardiology consult for clearance due to possible endoscopy Start strict I's and O's and daily weight  Recently diagnosed acute PE Hold Eliquis due to suspected GI bleed Consult cardiology  Hypertension On  clonidine Monitor vital signs closely  Type 2 diabetes Obtain hemoglobin A1c Start insulin sliding scale  CKD 4 Unclear of her baseline Creatinine 2.30 on 07/04/2018 Avoid nephrotoxic agents/hypotension Monitor urine output Repeat BMP in the morning  Ambulatory dysfunction with falls/physical debility Uses a walker and cane at baseline PT to assess Fall precautions  Rheumatoid arthritis Appears stable Denies use of NSAIDs Self-reported treated outpatient Obtain list of home medications    Risks: High risk for decompensation due to severe symptomatic anemia with hemoglobin of 5, suspected upper GI bleed, elevated troponin, multiple comorbidities, and advanced age.   DVT prophylaxis: SCDs.  VTE pharmacological prophylaxis contraindicated in the setting of suspected upper GI bleed.  Code Status: DNR  Family Communication: Updated godchildren.  All questions answered to their satisfaction.  Disposition Plan: Admit to telemetry unit  Consults called: GI, Dr. Collene Mares  Admission status: Inpatient status    Kayleen Memos MD [facility] Pager [phone]  If 7PM-7AM, please contact night-coverage [email or web address] Password [email or web address]  07/04/2018, 11:10 AM

## 2018-07-04 NOTE — Progress Notes (Signed)
No concern about mental status Please call with questions if that changes

## 2018-07-04 NOTE — Progress Notes (Signed)
Pharmacy Antibiotic Note  Katheren Jimmerson is a 72 y.o. adult admitted on 07/04/2018 with pneumonia.  Pharmacy has been consulted for cefepime dosing.  Plan: Cefepime 2g IV x1 followed by 1g IV Q24H.  Height: 5\' 5"  (165.1 cm) Weight: 225 lb 1.6 oz (102.1 kg) IBW/kg (Calculated) : 57  Temp (24hrs), Avg:98.7 F (37.1 C), Min:98.6 F (37 C), Max:98.7 F (37.1 C)  Recent Labs  Lab 07/04/18 1102 07/04/18 2218  WBC 14.1* 12.7*  CREATININE 2.46*  --     Estimated Creatinine Clearance (by C-G formula based on SCr of 2.46 mg/dL (H)) Female: 24.8 mL/min (A) Female: 30.3 mL/min (A)    Allergies  Allergen Reactions  . Colesevelam     Other reaction(s): GI Upset (intolerance)  . Dulaglutide     Other reaction(s): GI Upset (intolerance)  . Rosuvastatin     Other reaction(s): Myalgias (intolerance)  . Simvastatin     Other reaction(s): Myalgias (intolerance)     Thank you for allowing pharmacy to be a part of this patient's care.  Wynona Neat, PharmD, BCPS  07/04/2018 11:51 PM

## 2018-07-05 ENCOUNTER — Encounter (HOSPITAL_COMMUNITY): Payer: Self-pay

## 2018-07-05 ENCOUNTER — Encounter (HOSPITAL_COMMUNITY)
Admission: AD | Disposition: A | Discharge status: Skilled Nursing Facility | Payer: Self-pay | Source: Other Acute Inpatient Hospital | Attending: Internal Medicine

## 2018-07-05 ENCOUNTER — Encounter (HOSPITAL_COMMUNITY): Payer: Self-pay | Admitting: Certified Registered"

## 2018-07-05 ENCOUNTER — Inpatient Hospital Stay (HOSPITAL_COMMUNITY): Payer: Medicare Other | Admitting: Certified Registered"

## 2018-07-05 ENCOUNTER — Inpatient Hospital Stay (HOSPITAL_COMMUNITY): Payer: Medicare Other

## 2018-07-05 DIAGNOSIS — K922 Gastrointestinal hemorrhage, unspecified: Secondary | ICD-10-CM

## 2018-07-05 HISTORY — DX: Gastrointestinal hemorrhage, unspecified: K92.2

## 2018-07-05 HISTORY — PX: ESOPHAGOGASTRODUODENOSCOPY (EGD) WITH PROPOFOL: SHX5813

## 2018-07-05 LAB — CBC
HEMATOCRIT: 24.2 % — AB (ref 36.0–46.0)
Hemoglobin: 7.4 g/dL — ABNORMAL LOW (ref 12.0–15.0)
MCH: 27.9 pg (ref 26.0–34.0)
MCHC: 30.6 g/dL (ref 30.0–36.0)
MCV: 91.3 fL (ref 80.0–100.0)
NRBC: 0.7 % — AB (ref 0.0–0.2)
PLATELETS: 234 10*3/uL (ref 150–400)
RBC: 2.65 MIL/uL — AB (ref 3.87–5.11)
RDW: 20.3 % — ABNORMAL HIGH (ref 11.5–15.5)
WBC: 11.3 10*3/uL — ABNORMAL HIGH (ref 4.0–10.5)

## 2018-07-05 LAB — BASIC METABOLIC PANEL
ANION GAP: 13 (ref 5–15)
BUN: 39 mg/dL — AB (ref 8–23)
CHLORIDE: 106 mmol/L (ref 98–111)
CO2: 21 mmol/L — AB (ref 22–32)
Calcium: 8 mg/dL — ABNORMAL LOW (ref 8.9–10.3)
Creatinine, Ser: 2.5 mg/dL — ABNORMAL HIGH (ref 0.44–1.00)
GFR calc Af Amer: 22 mL/min — ABNORMAL LOW (ref >60)
GFR calc non Af Amer: 19 mL/min — ABNORMAL LOW (ref >60)
Glucose, Bld: 128 mg/dL — ABNORMAL HIGH (ref 70–99)
POTASSIUM: 3.7 mmol/L (ref 3.5–5.1)
Sodium: 140 mmol/L (ref 135–145)

## 2018-07-05 LAB — PREPARE RBC (CROSSMATCH)

## 2018-07-05 LAB — TROPONIN I: Troponin I: 0.47 ng/mL (ref <0.03)

## 2018-07-05 LAB — LACTIC ACID, PLASMA
Lactic Acid, Venous: 0.8 mmol/L (ref 0.5–1.9)
Lactic Acid, Venous: 0.8 mmol/L (ref 0.5–1.9)

## 2018-07-05 LAB — PROCALCITONIN: PROCALCITONIN: 0.2 ng/mL

## 2018-07-05 LAB — MRSA PCR SCREENING: MRSA by PCR: NEGATIVE

## 2018-07-05 LAB — ABO/RH: ABO/RH(D): O NEG

## 2018-07-05 SURGERY — ESOPHAGOGASTRODUODENOSCOPY (EGD) WITH PROPOFOL
Anesthesia: Monitor Anesthesia Care

## 2018-07-05 SURGERY — EGD (ESOPHAGOGASTRODUODENOSCOPY)
Anesthesia: Monitor Anesthesia Care | Laterality: Left

## 2018-07-05 MED ORDER — ALBUTEROL SULFATE (2.5 MG/3ML) 0.083% IN NEBU: 2.5000 mg | INHALATION_SOLUTION | Freq: Once | RESPIRATORY_TRACT | Status: DC | PRN

## 2018-07-05 MED ORDER — CLONIDINE HCL 0.2 MG PO TABS
0.2000 mg | ORAL_TABLET | Freq: Two times a day (BID) | ORAL | Status: DC
  Administered 2018-07-05: 0.2 mg via ORAL
  Filled 2018-07-05: qty 1

## 2018-07-05 MED ORDER — LIDOCAINE 2% (20 MG/ML) 5 ML SYRINGE
INTRAMUSCULAR | Status: DC | PRN
  Administered 2018-07-05: 100 mg via INTRAVENOUS

## 2018-07-05 MED ORDER — FUROSEMIDE 10 MG/ML IJ SOLN
40.0000 mg | Freq: Once | INTRAMUSCULAR | Status: AC
  Administered 2018-07-05: 40 mg via INTRAVENOUS
  Filled 2018-07-05: qty 4

## 2018-07-05 MED ORDER — PROPOFOL 500 MG/50ML IV EMUL
INTRAVENOUS | Status: DC | PRN
  Administered 2018-07-05: 100 ug/kg/min via INTRAVENOUS

## 2018-07-05 MED ORDER — SODIUM CHLORIDE 0.9% IV SOLUTION
Freq: Once | INTRAVENOUS | Status: AC
  Administered 2018-07-07: via INTRAVENOUS

## 2018-07-05 MED ORDER — SODIUM CHLORIDE 0.9 % IV SOLN
INTRAVENOUS | Status: DC
  Administered 2018-07-05: 14:00:00 via INTRAVENOUS

## 2018-07-05 SURGICAL SUPPLY — 15 items

## 2018-07-05 NOTE — Progress Notes (Signed)
Placed patient on BIPAP due to increased respiratory distress. Rapid Response is at bedside.

## 2018-07-05 NOTE — Consult Note (Signed)
Ref: Patient, No Pcp Per   Subjective:  Awake. Awaiting GI procedure. Decreasing respiratory distress.   Objective:  Vital Signs in the last 24 hours: Temp:  [98.1 F (36.7 C)-98.9 F (37.2 C)] 98.2 F (36.8 C) (12/29 1350) Pulse Rate:  [64-73] 67 (12/29 1400) Resp:  [15-33] 19 (12/29 1400) BP: (112-196)/(30-51) 112/47 (12/29 1400) SpO2:  [9 %-100 %] 100 % (12/29 1400) FiO2 (%):  [32 %-94 %] 32 % (12/29 0830) Weight:  [102.1 kg] 102.1 kg (12/28 2106)  Physical Exam: BP Readings from Last 1 Encounters:  07/05/18 (!) 112/47     Wt Readings from Last 1 Encounters:  07/04/18 102.1 kg    Weight change:  Body mass index is 37.46 kg/m. HEENT: [place]/AT, Eyes-Brown, PERL, EOMI, Conjunctiva-Pale, Sclera-Non-icteric Neck: No JVD, No bruit, Trachea midline. Lungs:  Basal crackles, Bilateral. Cardiac:  Regular rhythm, normal S1 and S2, no S3. II/VI systolic murmur. Abdomen:  Soft, non-tender. BS present. Extremities:  No edema present. No cyanosis. No clubbing. CNS: AxOx3, Cranial nerves grossly intact, moves all 4 extremities.  Skin: Warm and dry.   Intake/Output from previous day: No intake/output data recorded.    Lab Results: BMET    Component Value Date/Time   NA 140 07/05/2018 0531   NA 137 07/04/2018 1102   K 3.7 07/05/2018 0531   K 3.8 07/04/2018 1102   CL 106 07/05/2018 0531   CL 104 07/04/2018 1102   CO2 21 (L) 07/05/2018 0531   CO2 23 07/04/2018 1102   GLUCOSE 128 (H) 07/05/2018 0531   GLUCOSE 164 (H) 07/04/2018 1102   BUN 39 (H) 07/05/2018 0531   BUN 45 (H) 07/04/2018 1102   CREATININE 2.50 (H) 07/05/2018 0531   CREATININE 2.46 (H) 07/04/2018 1102   CALCIUM 8.0 (L) 07/05/2018 0531   CALCIUM 8.1 (L) 07/04/2018 1102   GFRNONAA 19 (L) 07/05/2018 0531   GFRNONAA 19 (L) 07/04/2018 1102   GFRAA 22 (L) 07/05/2018 0531   GFRAA 22 (L) 07/04/2018 1102   CBC    Component Value Date/Time   WBC 11.3 (H) 07/05/2018 0531   RBC 2.65 (L) 07/05/2018 0531   HGB 7.4  (L) 07/05/2018 0531   HCT 24.2 (L) 07/05/2018 0531   PLT 234 07/05/2018 0531   MCV 91.3 07/05/2018 0531   MCH 27.9 07/05/2018 0531   MCHC 30.6 07/05/2018 0531   RDW 20.3 (H) 07/05/2018 0531   LYMPHSABS 0.7 07/04/2018 2218   MONOABS 0.5 07/04/2018 2218   EOSABS 0.4 07/04/2018 2218   BASOSABS 0.0 07/04/2018 2218   HEPATIC Function Panel Recent Labs    07/04/18 1102  PROT 5.9*   HEMOGLOBIN A1C No components found for: HGA1C,  MPG CARDIAC ENZYMES Lab Results  Component Value Date   TROPONINI 0.47 (HH) 07/05/2018   TROPONINI 0.48 (HH) 07/04/2018   TROPONINI 0.50 (HH) 07/04/2018   BNP No results for input(s): PROBNP in the last 8760 hours. TSH No results for input(s): TSH in the last 8760 hours. CHOLESTEROL No results for input(s): CHOL in the last 8760 hours.  Scheduled Meds: . sodium chloride   Intravenous Once  . cloNIDine  0.2 mg Oral Q12H  . dextromethorphan-guaiFENesin  2 tablet Oral BID  . ipratropium-albuterol  3 mL Nebulization TID  . sodium chloride HYPERTONIC  4 mL Nebulization TID   Continuous Infusions: . ceFEPime (MAXIPIME) IV    . pantoprazole (PROTONIX) IV 80 mg (07/05/18 0909)   PRN Meds:.acetaminophen, ondansetron (ZOFRAN) IV  Assessment/Plan: Acute GI bleed Symptomatic anemia  Abnormal troponin I from demand ischemia H/O recent PE HTN Type 2 DM CKD, IV H/O diastolic left heart failure Rheumatoid arthritis  Continue medical treatment.   LOS: 1 day    Dixie Dials  MD  07/05/2018, 3:06 PM

## 2018-07-05 NOTE — Anesthesia Preprocedure Evaluation (Signed)
Anesthesia Evaluation  Patient identified by MRN, date of birth, ID band Patient awake    Reviewed: Allergy & Precautions, NPO status , Patient's Chart, lab work & pertinent test results  Airway Mallampati: III  TM Distance: >3 FB Neck ROM: Full    Dental  (+) Dental Advisory Given   Pulmonary neg pulmonary ROS,    Pulmonary exam normal breath sounds clear to auscultation       Cardiovascular (-) CHF Normal cardiovascular exam Rhythm:Regular Rate:Normal  Echo 06/23/18 at [facility] for PE workup: LVSF 60-65%, aortic sclerosis    Neuro/Psych negative neurological ROS  negative psych ROS   GI/Hepatic negative GI ROS, Neg liver ROS,   Endo/Other  negative endocrine ROS  Renal/GU negative Renal ROS     Musculoskeletal negative musculoskeletal ROS (+)   Abdominal (+) + obese,   Peds  Hematology negative hematology ROS (+)   Anesthesia Other Findings   Reproductive/Obstetrics negative OB ROS                             Anesthesia Physical Anesthesia Plan  ASA: III  Anesthesia Plan: MAC   Post-op Pain Management:    Induction: Intravenous  PONV Risk Score and Plan: 2 and Propofol infusion and Ondansetron  Airway Management Planned:   Additional Equipment:   Intra-op Plan:   Post-operative Plan:   Informed Consent: I have reviewed the patients History and Physical, chart, labs and discussed the procedure including the risks, benefits and alternatives for the proposed anesthesia with the patient or authorized representative who has indicated his/her understanding and acceptance.   Dental advisory given  Plan Discussed with: CRNA  Anesthesia Plan Comments:         Anesthesia Quick Evaluation

## 2018-07-05 NOTE — Op Note (Signed)
[facility] Patient Name: Jade Mclean Procedure Date : 07/05/2018 MRN: [id number] Attending MD: Juanita Craver , MD Date of Birth: 1946/03/19 CSN: [id number] Age: 72 Admit Type: Inpatient Procedure:                Diagnostic EGD. Indications:              Iron deficiency anemia, Heme positive stool. Providers:                Juanita Craver, MD, Elna Breslow, RN, Laverda Sorenson,                            Technician, Barrett Henle, CRNA Referring MD:             THP Medicines:                Monitored Anesthesia Care Complications:            No immediate complications. Estimated Blood Loss:     Estimated blood loss was minimal. Procedure:                Pre-Anesthesia Assessment: - Prior to the                            procedure, a history and physical was performed,                            and patient medications and allergies were                            reviewed. The patient's tolerance of previous                            anesthesia was also reviewed. The risks and                            benefits of the procedure and the sedation options                            and risks were discussed with the patient. All                            questions were answered, and informed consent was                            obtained. Prior Anticoagulants: The patient has                            taken Eliquis (apixaban), last dose was 2 days                            prior to procedure. ASA Grade Assessment: IV - A                            patient with severe systemic disease that is a  constant threat to life. After reviewing the risks                            and benefits, the patient was deemed in                            satisfactory condition to undergo the procedure.                            After obtaining informed consent, the endoscope was                            passed under direct vision. Throughout the                   procedure, the patient's blood pressure, pulse, and                            oxygen saturations were monitored continuously. The                            GIF-H190 ([id number]) Olympus Adult EGD was introduced                            through the mouth, and advanced to the second part                            of duodenum. The EGD was accomplished without                            difficulty. The patient tolerated the procedure                            well. Scope In: Scope Out: Findings:      The examined esophagus and the GEJ appeared widely patent and normal.      There were a few antral eroiosn but the rest of the entire examined       stomach appeared normal.      The cardia and gastric fundus were normal on retroflexion.      Three large superficial duodenal ulcers were found in the duodenal       bulb-mucosal friabilty was noted; there was no visible vessel identifed;  Impression:               - Normal appearing, widely patent esophagus and GEJ.                           - Few antral erosions; otherwise normal appearing                            stomach.                           - Multiple large superficial duodenal ulcers.                           -  Normal post-bulbar duodenum.                           - No specimens collected. Moderate Sedation:      MAC used. Recommendation:           - Clear liquid diet today.                           - Continue present medications.                           - Tranfuse another 2 units of PRBC's.                           - Consider IVC filter placement ASAP. Procedure Code(s):        --- Professional ---                           [phone], Esophagogastroduodenoscopy, flexible,                            transoral; diagnostic, including collection of                            specimen(s) by brushing or washing, when performed                            (separate procedure) Diagnosis Code(s):        --- Professional  ---                           D50.9, Iron deficiency anemia, unspecified                           R19.5, Other fecal abnormalities                           K26.4, Chronic or unspecified duodenal ulcer with                            hemorrhage CPT copyright 2018 [employer] Association. All rights reserved. The codes documented in this report are preliminary and upon coder review may  be revised to meet current compliance requirements. Juanita Craver, MD Juanita Craver, MD 07/05/2018 2:05:16 PM This report has been signed electronically. Number of Addenda: 0

## 2018-07-05 NOTE — Progress Notes (Signed)
Patient's Echo report from 12.17.2019 faxed from [facility] and placed on chart.

## 2018-07-05 NOTE — Significant Event (Signed)
Rapid Response Event Note  Overview: Time Called: 2137 Arrival Time: 2140 Event Type: Respiratory  Called for acute hypoxia with sats 50-60% on [place] after blood transfusion  Initial Focused Assessment: Upon arrival, Jade Mclean is an obese female, in significant respiratory distress with oxygen saturations 57% with a good pleth. Pt immediately placed on NRB mask RR 28-30 with abdominal accessory muscle use.  BBS diffuse coarse rhonchi bilaterally and diminished in the bases. Reportedly pt just completed a blood transfusion.  Skin is warm, pink and diaphoretic. Temp 98.9 F, HR 84, 154/68 (95), RR 35 with sats 100% on 60% FIO2 on BIPAP 16/8.    Interventions: -Stat NRB mask until BIPAP arrives -Stat PCXR -BIPAP    Event Summary: Name of Physician Notified: Lovey Newcomer APP at 2145    at    Outcome: Transferred (Comment)  Event End Time: 2300  Madelynn Done

## 2018-07-05 NOTE — Transfer of Care (Signed)
Immediate Anesthesia Transfer of Care Note  Patient: Jade Mclean  Procedure(s) Performed: ESOPHAGOGASTRODUODENOSCOPY (EGD) WITH PROPOFOL (N/A )  Patient Location: Endoscopy Unit  Anesthesia Type:MAC  Level of Consciousness: sedated and responds to stimulation  Airway & Oxygen Therapy: Patient Spontanous Breathing and Patient connected to nasal cannula oxygen  Post-op Assessment: Report given to RN, Post -op Vital signs reviewed and stable and Patient moving all extremities  Post vital signs: Reviewed and stable  Last Vitals:  Vitals Value Taken Time  BP    Temp    Pulse    Resp    SpO2      Last Pain:  Vitals:   07/05/18 1316  TempSrc: Oral  PainSc: 0-No pain         Complications: No apparent anesthesia complications

## 2018-07-05 NOTE — Progress Notes (Addendum)
PROGRESS NOTE  Jade Mclean UEA:[id number] DOB: 19-Jul-1945 DOA: 07/04/2018 PCP: Patient, No Pcp Per  HPI/Recap of past 24 hours:  Jade Mclean is a 72 y.o. female with past medical history significant for recently diagnosed pulmonary embolism on Eliquis and unspecified CHF, CKD 4, type 2 diabetes, hypertension, who presented from home last night via EMS to [facility] ED due to extreme fatigue and somnolence as noted by family members.  She was transported to [facility] ED where she was found to have a hemoglobin of 5 and a positive FOBT.  No GI services at [facility] at that time therefore the patient was transferred to [facility] for possible GI evaluation.  Patient received 2 units of PRBCs.  Repeat H&H 7.4. Pt still symptomatic.additional 2 unit PRBCs to be transfused today.  07/05/2018: Patient seen and examined at her bedside.  She is alert and oriented x3 however she is still very somnolent prior to the 2 additional units of PRBCs to be transfused.  EGD completed by Dr. Collene Mares with findings of multiple large superficial duodenal ulcers.  Eliquis has been on hold for the last 2 days.  Interventional radiology has been consulted for possible IVC filter placement ASAP.  Discussed with interventional radiology who reviewed the VQ scan reported to be negative.  Initial positive results were due to artifacts.  Assessment/Plan: Active Problems:   GI bleed  Acute upper GI bleed secondary to multiple large superficial duodenal ulcers Findings from EGD done by GI Dr. Collene Mares on 07/05/2018 Continue Protonix IV 80 mg twice daily Discontinue Eliquis due to negative VQ scan Start clear liquid diet as recommended by GI Previously received 2 units of PRBCs 2 additional 2 units of PRBCs to be transfused today Last hemoglobin 7.4 Repeat H&H after transfusion  Bilateral lower extremity tenderness Bilateral lower extremity duplex ultrasound pending  Elevated troponin Trending down Cardiology  consulted and cleared for endoscopy  Suspected HCAP, present on admission Continue IV cefepime Procalcitonin trending down Monitor for fever Obtain CBC in the morning  Recently diagnosed unspecified CHF BNP elevated greater than 17,000 We will need to obtain last 2D echo records from [facility] Cardiology consult for clearance due to possible endoscopy Start strict I's and O's and daily weight  Recently diagnosed acute PE; ruled out VQ scan reviewed by interventional radiology and reported to be negative Positive results were due to artifacts  Hypertension On clonidine Monitor vital signs closely  Type 2 diabetes Obtain hemoglobin A1c Start insulin sliding scale  CKD 4 Unclear of her baseline Creatinine 2.30 on 07/04/2018 Avoid nephrotoxic agents/hypotension Monitor urine output Repeat BMP in the morning  Ambulatory dysfunction with falls/physical debility Uses a walker and cane at baseline PT to assess Fall precautions  Rheumatoid arthritis Appears stable Denies use of NSAIDs Self-reported treated outpatient Obtain list of home medications    Risks: High risk for decompensation due to severe symptomatic anemia with hemoglobin of 5, acute GI bleed secondary to multiple superficial duodenal ulcers, elevated troponin, multiple comorbidities and advanced age.  DVT prophylaxis: SCDs.  VTE pharmacological prophylaxis contraindicated in the setting of suspected upper GI bleed.  Code Status: DNR  Family Communication: Updated godchildren.  All questions answered to their satisfaction.  Disposition Plan:  Discharge to home with home health PT RN and aide when cardiology and GI signed off.  Consults called: GI, Dr. Collene Mares; cardiology        Objective: Vitals:   07/05/18 1241 07/05/18 1316 07/05/18 1350 07/05/18 1400  BP: (!) 148/35 Marland Kitchen)  196/36 (!) 180/38 (!) 112/47  Pulse: 73 72 70 67  Resp: 15 (!) 25 (!) 33 19  Temp: 98.1 F (36.7 C) 98.9 F  (37.2 C) 98.2 F (36.8 C)   TempSrc: Oral Oral Oral   SpO2: 99% 97% 100% 100%  Weight:      Height:        Intake/Output Summary (Last 24 hours) at 07/05/2018 1429 Last data filed at 07/05/2018 1347 Gross per 24 hour  Intake 100 ml  Output 0 ml  Net 100 ml   Filed Weights   07/04/18 2106  Weight: 102.1 kg    Exam:  . General: 72 y.o. year-old adult well developed well nourished in no acute distress.  Somnolent but easily arousable to voices.  Oriented x3. . Cardiovascular: Regular rate and rhythm with no rubs or gallops.  No thyromegaly or JVD noted.   Marland Kitchen Respiratory: Clear to auscultation with no wheezes or rales. Good inspiratory effort. . Abdomen: Soft nontender nondistended with normal bowel sounds x4 quadrants. . Musculoskeletal: Trace lower extremity edema. 2/4 pulses in all 4 extremities. Marland Kitchen Psychiatry: Mood is appropriate for condition and setting   Data Reviewed: CBC: Recent Labs  Lab 07/04/18 1102 07/04/18 2218 07/05/18 0531  WBC 14.1* 12.7* 11.3*  NEUTROABS  --  11.0*  --   HGB 7.4* 7.3* 7.4*  HCT 22.4* 23.6* 24.2*  MCV 88.9 89.7 91.3  PLT 232 214 062   Basic Metabolic Panel: Recent Labs  Lab 07/04/18 1102 07/05/18 0531  NA 137 140  K 3.8 3.7  CL 104 106  CO2 23 21*  GLUCOSE 164* 128*  BUN 45* 39*  CREATININE 2.46* 2.50*  CALCIUM 8.1* 8.0*   GFR: Estimated Creatinine Clearance (by C-G formula based on SCr of 2.5 mg/dL (H)) Female: 24.4 mL/min (A) Female: 29.8 mL/min (A) Liver Function Tests: Recent Labs  Lab 07/04/18 1102  AST 30  ALT 19  ALKPHOS 33*  BILITOT 1.6*  PROT 5.9*  ALBUMIN 2.6*   No results for input(s): LIPASE, AMYLASE in the last 168 hours. No results for input(s): AMMONIA in the last 168 hours. Coagulation Profile: No results for input(s): INR, PROTIME in the last 168 hours. Cardiac Enzymes: Recent Labs  Lab 07/04/18 1102 07/04/18 1625 07/04/18 2218 07/05/18 0531  TROPONINI 0.40* 0.50* 0.48* 0.47*   BNP (last  3 results) No results for input(s): PROBNP in the last 8760 hours. HbA1C: No results for input(s): HGBA1C in the last 72 hours. CBG: No results for input(s): GLUCAP in the last 168 hours. Lipid Profile: No results for input(s): CHOL, HDL, LDLCALC, TRIG, CHOLHDL, LDLDIRECT in the last 72 hours. Thyroid Function Tests: No results for input(s): TSH, T4TOTAL, FREET4, T3FREE, THYROIDAB in the last 72 hours. Anemia Panel: No results for input(s): VITAMINB12, FOLATE, FERRITIN, TIBC, IRON, RETICCTPCT in the last 72 hours. Urine analysis: No results found for: COLORURINE, APPEARANCEUR, LABSPEC, [place], GLUCOSEU, HGBUR, BILIRUBINUR, [place], [place], UROBILINOGEN, NITRITE, LEUKOCYTESUR Sepsis Labs: @LABRCNTIP (procalcitonin:4,lacticidven:4)  ) Recent Results (from the past 240 hour(s))  MRSA PCR Screening     Status: None   Collection Time: 07/04/18 11:57 PM  Result Value Ref Range Status   MRSA by PCR NEGATIVE NEGATIVE Final    Comment:        The GeneXpert MRSA Assay (FDA approved for NASAL specimens only), is one component of a comprehensive MRSA colonization surveillance program. It is not intended to diagnose MRSA infection nor to guide or monitor treatment for MRSA infections. Performed at [facility], [place] [address], [place], [place] [address]       Studies: Dg Chest Port 1 View  Result Date: 07/04/2018 CLINICAL DATA:  Chest pain EXAM: PORTABLE CHEST 1 VIEW COMPARISON:  06/26/2018 FINDINGS: The patient is tilted to the left. Obscuration of the left hemidiaphragm and portions of the left heart border from presumed left pleural effusion is noted. Probable atelectasis at the left lung base as a result. Mild vascular congestion is seen. Aortic atherosclerosis without aneurysm is identified. IMPRESSION: Obscuration of the left hemidiaphragm and portions of the left heart border from presumed small to moderate left pleural effusion. Probable atelectasis at the left lung  base. Mild vascular congestion. Electronically Signed   By: Ashley Royalty M.D.   On: 07/04/2018 22:46    Scheduled Meds: . sodium chloride   Intravenous Once  . cloNIDine  0.2 mg Oral Q12H  . dextromethorphan-guaiFENesin  2 tablet Oral BID  . ipratropium-albuterol  3 mL Nebulization TID  . sodium chloride HYPERTONIC  4 mL Nebulization TID    Continuous Infusions: . ceFEPime (MAXIPIME) IV    . pantoprazole (PROTONIX) IV 80 mg (07/05/18 0909)     LOS: 1 day     Kayleen Memos, MD [facility] Pager [phone]  If 7PM-7AM, please contact night-coverage [email or web address] Password [facility] 07/05/2018, 2:29 PM

## 2018-07-05 NOTE — Evaluation (Signed)
Physical Therapy Evaluation Patient Details Name: Jade Mclean MRN: [id number] DOB: Jul 23, 1945 Today's Date: 07/05/2018   History of Present Illness  Pt adm to [facility] with acute GI bleed. Pt with recent PE and started blood thinners. Pt transfused 2 units of blood and transferred to [facility] for GI evaluation. Cardiac eval for elevated troponin and concluded abnormal troponin due to demand ischemia. PMH - recent PE, recent diagnosis chf, DM, HTN, CKD, morbid obesitiy.  Clinical Impression  Pt presents to PT with poor mobility due to weakness and fatigue. Expect pt will make good progress with increased Hgb. Assuming pt progresses fairly quickly expect she will be able to return home. If progress is slow may need to look at other post acute options.     Follow Up Recommendations Home health PT;Supervision/Assistance - 24 hour    Equipment Recommendations  None recommended by PT    Recommendations for Other Services OT consult     Precautions / Restrictions Precautions Precautions: Fall Restrictions Weight Bearing Restrictions: No      Mobility  Bed Mobility Overal bed mobility: Needs Assistance Bed Mobility: Supine to Sit;Sit to Supine;Rolling Rolling: Min assist   Supine to sit: Min assist Sit to supine: Min assist   General bed mobility comments: Assist to elevate trunk into sitting. Assist to bring legs back up into bed to return to supine  Transfers                 General transfer comment: Pt felt too weak to attempt with assistance of 1 person  Ambulation/Gait                Stairs            Wheelchair Mobility    Modified Rankin (Stroke Patients Only)       Balance Overall balance assessment: Needs assistance Sitting-balance support: No upper extremity supported;Feet supported Sitting balance-Leahy Scale: Fair                                       Pertinent Vitals/Pain Pain Assessment: No/denies pain     Home Living Family/patient expects to be discharged to:: Private residence Living Arrangements: Other relatives(brother) Available Help at Discharge: Family;Available PRN/intermittently Type of Home: House Home Access: Stairs to enter Entrance Stairs-Rails: None Entrance Stairs-Number of Steps: 2 Home Layout: One level Home Equipment: Cane - single point;Walker - 2 wheels;Wheelchair - manual      Prior Function Level of Independence: Independent with assistive device(s)         Comments: uses cane, drives     Hand Dominance        Extremity/Trunk Assessment   Upper Extremity Assessment Upper Extremity Assessment: Defer to OT evaluation    Lower Extremity Assessment Lower Extremity Assessment: Generalized weakness       Communication   Communication: No difficulties  Cognition Arousal/Alertness: Awake/alert Behavior During Therapy: WFL for tasks assessed/performed Overall Cognitive Status: Within Functional Limits for tasks assessed                                        General Comments      Exercises     Assessment/Plan    PT Assessment Patient needs continued PT services  PT Problem List Decreased strength;Decreased activity tolerance;Decreased balance;Decreased mobility;Obesity  PT Treatment Interventions DME instruction;Gait training;Stair training;Functional mobility training;Therapeutic activities;Therapeutic exercise;Balance training;Patient/family education    PT Goals (Current goals can be found in the Care Plan section)  Acute Rehab PT Goals Patient Stated Goal: return home PT Goal Formulation: With patient Time For Goal Achievement: 07/19/18 Potential to Achieve Goals: Good    Frequency Min 3X/week   Barriers to discharge Inaccessible home environment stairs to enter    Co-evaluation               AM-PAC PT "6 Clicks" Mobility  Outcome Measure Help needed turning from your back to your side while in  a flat bed without using bedrails?: A Little Help needed moving from lying on your back to sitting on the side of a flat bed without using bedrails?: A Little Help needed moving to and from a bed to a chair (including a wheelchair)?: Total Help needed standing up from a chair using your arms (e.g., wheelchair or bedside chair)?: Total Help needed to walk in hospital room?: Total Help needed climbing 3-5 steps with a railing? : Total 6 Click Score: 10    End of Session Equipment Utilized During Treatment: Oxygen Activity Tolerance: Patient limited by fatigue Patient left: in bed;with call bell/phone within reach;with bed alarm set   PT Visit Diagnosis: Other abnormalities of gait and mobility (R26.89);Muscle weakness (generalized) (M62.81)    Time: 9935-7017 PT Time Calculation (min) (ACUTE ONLY): 20 min   Charges:   PT Evaluation $PT Eval Moderate Complexity: [place] Pager [phone] Office [place] 07/05/2018, 10:39 AM

## 2018-07-06 ENCOUNTER — Inpatient Hospital Stay (HOSPITAL_BASED_OUTPATIENT_CLINIC_OR_DEPARTMENT_OTHER): Payer: Medicare Other

## 2018-07-06 DIAGNOSIS — D62 Acute posthemorrhagic anemia: Secondary | ICD-10-CM

## 2018-07-06 DIAGNOSIS — R52 Pain, unspecified: Secondary | ICD-10-CM

## 2018-07-06 LAB — TYPE AND SCREEN
ABO/RH(D): O NEG
Antibody Screen: NEGATIVE
Unit division: 0
Unit division: 0

## 2018-07-06 LAB — BPAM RBC
Blood Product Expiration Date: 202001102359
Blood Product Expiration Date: 202001102359
ISSUE DATE / TIME: 201912291220
ISSUE DATE / TIME: 201912291612
Unit Type and Rh: 9500
Unit Type and Rh: 9500

## 2018-07-06 LAB — CBC WITH DIFFERENTIAL/PLATELET
Abs Immature Granulocytes: 0.05 10*3/uL (ref 0.00–0.07)
BASOS ABS: 0 10*3/uL (ref 0.0–0.1)
Basophils Relative: 0 %
Eosinophils Absolute: 0.3 10*3/uL (ref 0.0–0.5)
Eosinophils Relative: 3 %
HCT: 32.5 % — ABNORMAL LOW (ref 36.0–46.0)
Hemoglobin: 10.7 g/dL — ABNORMAL LOW (ref 12.0–15.0)
IMMATURE GRANULOCYTES: 1 %
Lymphocytes Relative: 4 %
Lymphs Abs: 0.4 10*3/uL — ABNORMAL LOW (ref 0.7–4.0)
MCH: 29.5 pg (ref 26.0–34.0)
MCHC: 32.9 g/dL (ref 30.0–36.0)
MCV: 89.5 fL (ref 80.0–100.0)
Monocytes Absolute: 0.5 10*3/uL (ref 0.1–1.0)
Monocytes Relative: 4 %
NEUTROS PCT: 88 %
Neutro Abs: 9.7 10*3/uL — ABNORMAL HIGH (ref 1.7–7.7)
Platelets: 213 10*3/uL (ref 150–400)
RBC: 3.63 MIL/uL — ABNORMAL LOW (ref 3.87–5.11)
RDW: 18.5 % — ABNORMAL HIGH (ref 11.5–15.5)
WBC: 10.9 10*3/uL — ABNORMAL HIGH (ref 4.0–10.5)
nRBC: 0.4 % — ABNORMAL HIGH (ref 0.0–0.2)

## 2018-07-06 LAB — HEMOGLOBIN A1C
Hgb A1c MFr Bld: 4.7 % — ABNORMAL LOW (ref 4.8–5.6)
Mean Plasma Glucose: 88.19 mg/dL

## 2018-07-06 LAB — BLOOD GAS, ARTERIAL
Acid-base deficit: 2.1 mmol/L — ABNORMAL HIGH (ref 0.0–2.0)
Bicarbonate: 21.9 mmol/L (ref 20.0–28.0)
Delivery systems: POSITIVE
Drawn by: 441371
Expiratory PAP: 8
FIO2: 60
Inspiratory PAP: 16
Mode: POSITIVE
O2 Saturation: 99.9 %
PATIENT TEMPERATURE: 98.4
PH ART: 7.412 (ref 7.350–7.450)
pCO2 arterial: 34.9 mmHg (ref 32.0–48.0)
pO2, Arterial: 206 mmHg — ABNORMAL HIGH (ref 83.0–108.0)

## 2018-07-06 LAB — BASIC METABOLIC PANEL
ANION GAP: 12 (ref 5–15)
BUN: 31 mg/dL — ABNORMAL HIGH (ref 8–23)
CO2: 22 mmol/L (ref 22–32)
Calcium: 7.8 mg/dL — ABNORMAL LOW (ref 8.9–10.3)
Chloride: 106 mmol/L (ref 98–111)
Creatinine, Ser: 2.22 mg/dL — ABNORMAL HIGH (ref 0.44–1.00)
GFR calc non Af Amer: 22 mL/min — ABNORMAL LOW (ref >60)
GFR, EST AFRICAN AMERICAN: 25 mL/min — AB (ref >60)
GLUCOSE: 155 mg/dL — AB (ref 70–99)
Potassium: 3.8 mmol/L (ref 3.5–5.1)
Sodium: 140 mmol/L (ref 135–145)

## 2018-07-06 LAB — GLUCOSE, CAPILLARY: GLUCOSE-CAPILLARY: 152 mg/dL — AB (ref 70–99)

## 2018-07-06 MED ORDER — CARVEDILOL 25 MG PO TABS
25.0000 mg | ORAL_TABLET | Freq: Two times a day (BID) | ORAL | Status: DC
  Administered 2018-07-06 – 2018-07-14 (×16): 25 mg via ORAL
  Filled 2018-07-06 (×17): qty 1

## 2018-07-06 MED ORDER — METHYLPREDNISOLONE SODIUM SUCC 40 MG IJ SOLR
40.0000 mg | Freq: Three times a day (TID) | INTRAMUSCULAR | Status: DC
  Administered 2018-07-06 – 2018-07-07 (×4): 40 mg via INTRAVENOUS
  Filled 2018-07-06 (×4): qty 1

## 2018-07-06 MED ORDER — NIFEDIPINE ER OSMOTIC RELEASE 60 MG PO TB24
60.0000 mg | ORAL_TABLET | Freq: Two times a day (BID) | ORAL | Status: DC
  Administered 2018-07-06 – 2018-07-09 (×8): 60 mg via ORAL
  Filled 2018-07-06 (×9): qty 1

## 2018-07-06 MED ORDER — INSULIN ASPART 100 UNIT/ML ~~LOC~~ SOLN
0.0000 [IU] | Freq: Every day | SUBCUTANEOUS | Status: DC
  Administered 2018-07-07: 2 [IU] via SUBCUTANEOUS

## 2018-07-06 MED ORDER — FUROSEMIDE 10 MG/ML IJ SOLN
40.0000 mg | Freq: Every day | INTRAMUSCULAR | Status: DC
  Administered 2018-07-06 – 2018-07-10 (×5): 40 mg via INTRAVENOUS
  Filled 2018-07-06 (×5): qty 4

## 2018-07-06 MED ORDER — HYDRALAZINE HCL 20 MG/ML IJ SOLN
5.0000 mg | INTRAMUSCULAR | Status: DC | PRN
  Administered 2018-07-06: 5 mg via INTRAVENOUS
  Filled 2018-07-06: qty 1

## 2018-07-06 MED ORDER — ADULT MULTIVITAMIN W/MINERALS CH
1.0000 | ORAL_TABLET | Freq: Every day | ORAL | Status: DC
  Administered 2018-07-06 – 2018-07-14 (×9): 1 via ORAL
  Filled 2018-07-06 (×9): qty 1

## 2018-07-06 MED ORDER — ORAL CARE MOUTH RINSE
15.0000 mL | Freq: Two times a day (BID) | OROMUCOSAL | Status: DC
  Administered 2018-07-06 – 2018-07-13 (×12): 15 mL via OROMUCOSAL

## 2018-07-06 MED ORDER — DIPHENHYDRAMINE HCL 50 MG/ML IJ SOLN
12.5000 mg | Freq: Two times a day (BID) | INTRAMUSCULAR | Status: DC
  Administered 2018-07-06: 12.5 mg via INTRAVENOUS
  Filled 2018-07-06 (×2): qty 1

## 2018-07-06 MED ORDER — EZETIMIBE 10 MG PO TABS
10.0000 mg | ORAL_TABLET | Freq: Every day | ORAL | Status: DC
  Administered 2018-07-06 – 2018-07-14 (×9): 10 mg via ORAL
  Filled 2018-07-06 (×9): qty 1

## 2018-07-06 MED ORDER — INSULIN ASPART 100 UNIT/ML ~~LOC~~ SOLN
0.0000 [IU] | Freq: Three times a day (TID) | SUBCUTANEOUS | Status: DC
  Administered 2018-07-06: 2 [IU] via SUBCUTANEOUS
  Administered 2018-07-07: 3 [IU] via SUBCUTANEOUS
  Administered 2018-07-07 (×2): 2 [IU] via SUBCUTANEOUS
  Administered 2018-07-08: 1 [IU] via SUBCUTANEOUS
  Administered 2018-07-08: 2 [IU] via SUBCUTANEOUS
  Administered 2018-07-09: 1 [IU] via SUBCUTANEOUS
  Administered 2018-07-09: 2 [IU] via SUBCUTANEOUS
  Administered 2018-07-09: 1 [IU] via SUBCUTANEOUS
  Administered 2018-07-10: 2 [IU] via SUBCUTANEOUS
  Administered 2018-07-10: 1 [IU] via SUBCUTANEOUS
  Administered 2018-07-10 – 2018-07-11 (×2): 2 [IU] via SUBCUTANEOUS
  Administered 2018-07-12: 1 [IU] via SUBCUTANEOUS

## 2018-07-06 MED ORDER — CLONIDINE HCL 0.2 MG/24HR TD PTWK
0.2000 mg | MEDICATED_PATCH | TRANSDERMAL | Status: DC
  Administered 2018-07-06 – 2018-07-12 (×2): 0.2 mg via TRANSDERMAL
  Filled 2018-07-06 (×2): qty 1

## 2018-07-06 MED ORDER — HYDRALAZINE HCL 20 MG/ML IJ SOLN
10.0000 mg | INTRAMUSCULAR | Status: DC | PRN
  Administered 2018-07-06 – 2018-07-09 (×3): 10 mg via INTRAVENOUS
  Filled 2018-07-06 (×4): qty 1

## 2018-07-06 MED ORDER — METOPROLOL TARTRATE 5 MG/5ML IV SOLN
5.0000 mg | INTRAVENOUS | Status: DC | PRN
  Filled 2018-07-06: qty 5

## 2018-07-06 MED ORDER — CHLORHEXIDINE GLUCONATE 0.12 % MT SOLN
15.0000 mL | Freq: Two times a day (BID) | OROMUCOSAL | Status: DC
  Administered 2018-07-06 – 2018-07-14 (×15): 15 mL via OROMUCOSAL
  Filled 2018-07-06 (×17): qty 15

## 2018-07-06 NOTE — Progress Notes (Signed)
Noted multiple skin breakdown noted  On the abdomen, coccyx,buttocks , possibly MASD. Dr. [place] Crane updated, [place] consult ordered per protocol.

## 2018-07-06 NOTE — Progress Notes (Signed)
[facility] Progress Note   Chief Complaint:   Anemia / heme positive stools   SUBJECTIVE:    no complaints. No abdominal pain   ASSESSMENT AND PLAN:   72 yo female with multiple medical problems admitted with normocytic anemia / Heme positive stools on Eliquis (recent PE). Presented to [facility] with hgb ~ 5, transfused and transferred here. -After additional 2 units of pRBCs yesterday hgb up from 7.4 to 10.7. Inpatient EGD yesterday >>> a few gastric erosions and 3 large superficial duodenal ulcers -continue IV PPI -I expect her hgb will decline some given overzealous response to just 2 units.     OBJECTIVE:     Vital signs in last 24 hours: Temp:  [98.1 F (36.7 C)-99.5 F (37.5 C)] 98.4 F (36.9 C) (12/30 0827) Pulse Rate:  [66-99] 80 (12/30 0911) Resp:  [14-36] 32 (12/30 0911) BP: (101-198)/(30-97) 190/62 (12/30 0911) SpO2:  [95 %-100 %] 100 % (12/30 0911) FiO2 (%):  [30 %-60 %] 30 % (12/30 0854) Last BM Date: 07/05/18 General:   Sleepy female in NAD Heart:  Regular rate , irreg rhythm.  No significant lower extremity edema   Pulm: Normal respiratory effort on 02 per [place]. Abdomen:  Soft, nondistended, nontender.  Normal bowel sounds       Psych:  Pleasant, cooperative.  Answers questions appropriately   Intake/Output from previous day: 12/29 0701 - 12/30 0700 In: 1195.3 [I.V.:100; Blood:882; IV Piggyback:213.3] Out: 800 [Urine:800] Intake/Output this shift: No intake/output data recorded.  Lab Results: Recent Labs    07/04/18 2218 07/05/18 0531 07/06/18 0400  WBC 12.7* 11.3* 10.9*  HGB 7.3* 7.4* 10.7*  HCT 23.6* 24.2* 32.5*  PLT 214 234 213   BMET Recent Labs    07/04/18 1102 07/05/18 0531 07/06/18 0400  NA 137 140 140  K 3.8 3.7 3.8  CL 104 106 106  CO2 23 21* 22  GLUCOSE 164* 128* 155*  BUN 45* 39* 31*  CREATININE 2.46* 2.50* 2.22*  CALCIUM 8.1* 8.0* 7.8*   LFT Recent Labs    07/04/18 1102  PROT 5.9*    ALBUMIN 2.6*  AST 30  ALT 19  ALKPHOS 33*  BILITOT 1.6*    Dg Chest Port 1 View  Result Date: 07/05/2018 CLINICAL DATA:  Acute respiratory distress EXAM: PORTABLE CHEST 1 VIEW COMPARISON:  Chest radiograph from one day prior. FINDINGS: Stable cardiomediastinal silhouette with mild cardiomegaly. No pneumothorax. No right pleural effusion. Stable blunting of the left costophrenic angle. Mild-to-moderate pulmonary appears slightly worsened. IMPRESSION: Mild-to-moderate congestive heart failure, slightly worsened. Stable small left pleural effusion. Electronically Signed   By: Ilona Sorrel M.D.   On: 07/05/2018 22:34   Dg Chest Port 1 View  Result Date: 07/04/2018 CLINICAL DATA:  Chest pain EXAM: PORTABLE CHEST 1 VIEW COMPARISON:  06/26/2018 FINDINGS: The patient is tilted to the left. Obscuration of the left hemidiaphragm and portions of the left heart border from presumed left pleural effusion is noted. Probable atelectasis at the left lung base as a result. Mild vascular congestion is seen. Aortic atherosclerosis without aneurysm is identified. IMPRESSION: Obscuration of the left hemidiaphragm and portions of the left heart border from presumed small to moderate left pleural effusion. Probable atelectasis at the left lung base. Mild vascular congestion. Electronically Signed   By: Ashley Royalty M.D.   On: 07/04/2018 22:46     LOS: 2 days   Tye Savoy ,NP 07/06/2018, 11:41 AM

## 2018-07-06 NOTE — Anesthesia Postprocedure Evaluation (Signed)
Anesthesia Post Note  Patient: Jade Mclean  Procedure(s) Performed: ESOPHAGOGASTRODUODENOSCOPY (EGD) WITH PROPOFOL (N/A )     Patient location during evaluation: PACU Anesthesia Type: MAC Level of consciousness: awake and alert Pain management: pain level controlled Vital Signs Assessment: post-procedure vital signs reviewed and stable Respiratory status: spontaneous breathing Cardiovascular status: stable Anesthetic complications: no    Last Vitals:  Vitals:   07/06/18 0545 07/06/18 0615  BP: (!) 197/66 (!) 195/63  Pulse: 75 99  Resp: (!) 27 (!) 26  Temp:    SpO2: 100% 100%    Last Pain:  Vitals:   07/06/18 0439  TempSrc: Oral  PainSc:                  Nolon Nations

## 2018-07-06 NOTE — Progress Notes (Signed)
Heard pt calling from across the room. Went in to check on her. Noticed she was in respiratory distress. Pulse ox showed she was quickly desatting. Notified respiratory, David from rapid response, and NP [place]. Pt. Placed on BIPAP. CXR was ordered. Pt. transferred to step down unit 6E15.No belongings to transfer with pt. Report given to nurse on the unit. Pt.'s niece, Gordy Councilman notified of pt.'s transfer off the floor.

## 2018-07-06 NOTE — Progress Notes (Signed)
RT Note:  Asked patient about Bipap, she stated that she did not want to wear it, she wanted to use her nasal cannula.

## 2018-07-06 NOTE — Clinical Social Work Note (Signed)
Clinical Social Work Assessment  Patient Details  Name: Jade Mclean MRN: [id number] Date of Birth: [birth year]  Date of referral:  07/06/18               Reason for consult:  Facility Placement, Discharge Planning                Permission sought to share information with:  Facility Sport and exercise psychologist, Family Supports Permission granted to share information::  Yes, Verbal Permission Granted  Name::     Gordy Councilman  Agency::  SNFs  Relationship::  daughter  Contact Information:  [phone]  Housing/Transportation Living arrangements for the past 2 months:  [place] of Information:  Patient, Adult Children Patient Interpreter Needed:  None Criminal Activity/Legal Involvement Pertinent to Current Situation/Hospitalization:  No - Comment as needed Significant Relationships:  Adult Children Lives with:  Siblings Do you feel safe going back to the place where you live?  Yes Need for family participation in patient care:  Yes (Comment)  Care giving concerns:  Patient from home. PT recommending home health, however family is requesting SNF.   Social Worker assessment / plan: CSW consulted for SNF placement. CSW met with patient and daughter at bedside. Patient alert and oriented, but distracted, stating she was very itchy.   CSW introduced self and role. Daughter indicated that patient was home with home health, but that the home health social worker had been helping them to get patient to a SNF for rehab when patient was admitted to the hospital. They are requesting [facility] or [facility] for SNF.   Patient has [employer] but it is not yet on file. CSW to send out initial referrals. Patient will need [facility] auth prior to admitting to a facility. Facility to initiate auth when identified. CSW to follow.  Employment status:  Retired Presenter, broadcasting) PT Recommendations:  Home with [place] / Referral to community  resources:  [place]  Patient/Family's Response to care: Patient and daughter appreciative of care.  Patient/Family's Understanding of and Emotional Response to Diagnosis, Current Treatment, and Prognosis: Patient and daughter with understanding of patient's condition and are requesting SNF.  Emotional Assessment Appearance:  Appears stated age Attitude/Demeanor/Rapport:  Engaged Affect (typically observed):  Accepting, Appropriate, Calm Orientation:  Oriented to Self, Oriented to Place, Oriented to  Time, Oriented to Situation Alcohol / Substance use:  Not Applicable Psych involvement (Current and /or in the community):  No (Comment)  Discharge Needs  Concerns to be addressed:  Discharge Planning Concerns, Care Coordination Readmission within the last 30 days:  No Current discharge risk:  Physical Impairment Barriers to Discharge:  Continued Medical Work up, [place], [place] 07/06/2018, 5:15 PM

## 2018-07-06 NOTE — Consult Note (Signed)
Ref: Patient, No Pcp Per   Subjective:  No chest pain. No respiratory distress. Negative PE on prior VQ hence systemic anticoagulation on hold.   Objective:  Vital Signs in the last 24 hours: Temp:  [98.2 F (36.8 C)-99.5 F (37.5 C)] 98.4 F (36.9 C) (12/30 0827) Pulse Rate:  [66-99] 85 (12/30 1411) Cardiac Rhythm: Normal sinus rhythm (12/30 1300) Resp:  [16-36] 16 (12/30 1455) BP: (101-200)/(50-97) 170/58 (12/30 1455) SpO2:  [95 %-100 %] 98 % (12/30 1411) FiO2 (%):  [30 %-60 %] 30 % (12/30 0854)  Physical Exam: BP Readings from Last 1 Encounters:  07/06/18 (!) 170/58     Wt Readings from Last 1 Encounters:  07/04/18 102.1 kg    Weight change:  Body mass index is 37.46 kg/m. HEENT: [place]/AT, Eyes-Brown, PERL, EOMI, Conjunctiva-Pale, Sclera-Non-icteric Neck: No JVD, No bruit, Trachea midline. Lungs:  Basal crackles, Bilateral. Cardiac:  Regular rhythm, normal S1 and S2, no S3. II/VI systolic murmur. Abdomen:  Soft, non-tender. BS present. Extremities:  No edema present. No cyanosis. No clubbing. CNS: AxOx3, Cranial nerves grossly intact, moves all 4 extremities.  Skin: Warm and dry.   Intake/Output from previous day: 12/29 0701 - 12/30 0700 In: 1195.3 [I.V.:100; Blood:882; IV Piggyback:213.3] Out: 800 [Urine:800]    Lab Results: BMET    Component Value Date/Time   NA 140 07/06/2018 0400   NA 140 07/05/2018 0531   NA 137 07/04/2018 1102   K 3.8 07/06/2018 0400   K 3.7 07/05/2018 0531   K 3.8 07/04/2018 1102   CL 106 07/06/2018 0400   CL 106 07/05/2018 0531   CL 104 07/04/2018 1102   CO2 22 07/06/2018 0400   CO2 21 (L) 07/05/2018 0531   CO2 23 07/04/2018 1102   GLUCOSE 155 (H) 07/06/2018 0400   GLUCOSE 128 (H) 07/05/2018 0531   GLUCOSE 164 (H) 07/04/2018 1102   BUN 31 (H) 07/06/2018 0400   BUN 39 (H) 07/05/2018 0531   BUN 45 (H) 07/04/2018 1102   CREATININE 2.22 (H) 07/06/2018 0400   CREATININE 2.50 (H) 07/05/2018 0531   CREATININE 2.46 (H) 07/04/2018  1102   CALCIUM 7.8 (L) 07/06/2018 0400   CALCIUM 8.0 (L) 07/05/2018 0531   CALCIUM 8.1 (L) 07/04/2018 1102   GFRNONAA 22 (L) 07/06/2018 0400   GFRNONAA 19 (L) 07/05/2018 0531   GFRNONAA 19 (L) 07/04/2018 1102   GFRAA 25 (L) 07/06/2018 0400   GFRAA 22 (L) 07/05/2018 0531   GFRAA 22 (L) 07/04/2018 1102   CBC    Component Value Date/Time   WBC 10.9 (H) 07/06/2018 0400   RBC 3.63 (L) 07/06/2018 0400   HGB 10.7 (L) 07/06/2018 0400   HCT 32.5 (L) 07/06/2018 0400   PLT 213 07/06/2018 0400   MCV 89.5 07/06/2018 0400   MCH 29.5 07/06/2018 0400   MCHC 32.9 07/06/2018 0400   RDW 18.5 (H) 07/06/2018 0400   LYMPHSABS 0.4 (L) 07/06/2018 0400   MONOABS 0.5 07/06/2018 0400   EOSABS 0.3 07/06/2018 0400   BASOSABS 0.0 07/06/2018 0400   HEPATIC Function Panel Recent Labs    07/04/18 1102  PROT 5.9*   HEMOGLOBIN A1C No components found for: HGA1C,  MPG CARDIAC ENZYMES Lab Results  Component Value Date   TROPONINI 0.47 (HH) 07/05/2018   TROPONINI 0.48 (HH) 07/04/2018   TROPONINI 0.50 (HH) 07/04/2018   BNP No results for input(s): PROBNP in the last 8760 hours. TSH No results for input(s): TSH in the last 8760 hours. CHOLESTEROL No results  for input(s): CHOL in the last 8760 hours.  Scheduled Meds: . sodium chloride   Intravenous Once  . carvedilol  25 mg Oral BID  . chlorhexidine  15 mL Mouth Rinse BID  . cloNIDine  0.2 mg Transdermal Q Sun  . dextromethorphan-guaiFENesin  2 tablet Oral BID  . diphenhydrAMINE  12.5 mg Intravenous BID  . ezetimibe  10 mg Oral Daily  . furosemide  40 mg Intravenous Daily  . insulin aspart  0-5 Units Subcutaneous QHS  . insulin aspart  0-9 Units Subcutaneous TID WC  . ipratropium-albuterol  3 mL Nebulization TID  . mouth rinse  15 mL Mouth Rinse q12n4p  . methylPREDNISolone (SOLU-MEDROL) injection  40 mg Intravenous Q8H  . multivitamin with minerals  1 tablet Oral Daily  . NIFEdipine  60 mg Oral BID  . sodium chloride HYPERTONIC  4 mL  Nebulization TID   Continuous Infusions: . ceFEPime (MAXIPIME) IV Stopped (07/06/18 0041)  . pantoprazole (PROTONIX) IV Stopped (07/06/18 0102)   PRN Meds:.acetaminophen, albuterol, hydrALAZINE, metoprolol tartrate, ondansetron (ZOFRAN) IV  Assessment/Plan: Acute GI bleed Symptomatic anemia Abnormal troponin I from demand ischemia H/O PE HTN Type 2 DM Morbid obesity CKD, IV Rheumatoid arthritis  Continue medical treatment without Eliquis.   LOS: 2 days    Dixie Dials  MD  07/06/2018, 5:36 PM

## 2018-07-06 NOTE — Progress Notes (Signed)
PROGRESS NOTE  Sakari Raisanen NWG:[id number] DOB: 1946/03/14 DOA: 07/04/2018 PCP: Patient, No Pcp Per  HPI/Recap of past 24 hours:  Jade Mclean is a 72 y.o. female with past medical history significant for recently diagnosed pulmonary embolism on Eliquis and unspecified CHF, CKD 4, type 2 diabetes, hypertension, who presented from home last night via EMS to [facility] ED due to extreme fatigue and somnolence as noted by family members.  She was transported to [facility] ED where she was found to have a hemoglobin of 5 and a positive FOBT.  No GI services at [facility] at that time therefore the patient was transferred to [facility] for possible GI evaluation.  Patient received 2 units of PRBCs.  Repeat H&H 7.4. Pt still symptomatic.additional 2 unit PRBCs to be transfused today.  07/05/2018: Patient seen and examined at her bedside.  She is alert and oriented x3 however she is still very somnolent prior to the 2 additional units of PRBCs to be transfused.  EGD completed by Dr. Collene Mares with findings of multiple large superficial duodenal ulcers.  Eliquis has been on hold for the last 2 days.  Interventional radiology has been consulted for possible IVC filter placement ASAP.  Discussed with interventional radiology who reviewed the VQ scan reported to be negative.  Initial positive results were due to artifacts.  07/06/2018: Patient seen and examined at bedside.  Rapid response called overnight due to respiratory distress after blood transfusion.  Chest x-ray done independently reviewed which revealed mild increase in pulmonary vascularity suggestive of mild pulmonary edema.  Given 1 dose of IV Lasix 40 mg.  We will give another dose of 40 mg of IV Lasix.  ABG done which was unremarkable.  Patient placed on 3 L of oxygen by nasal cannula saturating 100%.  Assessment/Plan: Active Problems:   GI bleed  Acute upper GI bleed secondary to multiple large superficial duodenal ulcers Findings from EGD  done by GI Dr. Collene Mares on 07/05/2018 Continue Protonix IV 80 mg twice daily Discontinue Eliquis due to negative VQ scan No evidence of PE or prior PE. Continue clear liquid diet as recommended by GI Received 4 units of PRBCs Hemoglobin 10.7 this morning No sign of overt bleeding at this time  Acute hypoxic respiratory failure suspect secondary to acute pulmonary edema from blood transfusion Received IV Lasix total of 80 mg today We will start IV Lasix 40 mg daily, starting tomorrow Monitor urine output Monitor electrolytes Maintain O2 saturation greater than 92% Currently saturating 100% on 3 L Wean off oxygen as tolerated  Bilateral lower extremity tenderness Bilateral lower extremity duplex ultrasound pending  Elevated troponin Trending down Peaked at 0.50 on 07/04/2018 Cardiology consulted and cleared for endoscopy which was done on 07/05/2018  Suspected HCAP, present on admission Continue IV cefepime Procalcitonin trending down Monitor for fever WBCs trending down  Recently diagnosed unspecified CHF BNP elevated greater than 17,000 We will need to obtain last 2D echo records from [facility] Cardiology consult for clearance due to possible endoscopy Continue strict I's and O's and daily weight  Recently diagnosed acute PE; ruled out VQ scan reviewed by interventional radiology and reported to be low probability for PE Prior positive results were due to artifacts  Uncontrolled Hypertension On clonidine 0.2 mg twice daily Resume home dose Coreg 25 mg twice daily and nifedipine Monitor vital signs closely Hydralazine IV 10 mg every 6 hours PRN for systolic blood pressure greater than 578 or diastolic greater than 469 Also on Lasix IV  40 mg daily  Type 2 diabetes Obtain hemoglobin A1c Continue insulin sliding scale sensitive scale  CKD 4 Unclear of her baseline Creatinine 2.30 on 07/04/2018 Continue trending down to 2.22 from 2.50 Continue to monitor urine  output Avoid nephrotoxic agents Repeat BMP in the morning  Ambulatory dysfunction with falls/physical debility Uses a walker and cane at baseline PT to assess Fall precautions  Rheumatoid arthritis Appears stable Denies use of NSAIDs On Enbrel qFriday    Risks: High risk for decompensation due to severe symptomatic anemia with hemoglobin of 5, acute GI bleed secondary to multiple superficial duodenal ulcers, elevated troponin, multiple comorbidities and advanced age.  DVT prophylaxis: SCDs.  VTE pharmacological prophylaxis contraindicated in the setting of suspected upper GI bleed.  Code Status: DNR  Family Communication: Updated godchildren.  All questions answered to their satisfaction.  Disposition Plan:  Discharge to home with home health PT RN and aide when cardiology and GI signed off.  Consults called: GI, Dr. Collene Mares; cardiology        Objective: Vitals:   07/06/18 0645 07/06/18 0827 07/06/18 0854 07/06/18 0911  BP: (!) 194/58 (!) 193/88 (!) 190/62 (!) 190/62  Pulse: 86 80 81 80  Resp: (!) 34 (!) 26 (!) 27 (!) 32  Temp:  98.4 F (36.9 C)    TempSrc:  Axillary    SpO2: 100% 100% 100% 100%  Weight:      Height:        Intake/Output Summary (Last 24 hours) at 07/06/2018 1040 Last data filed at 07/06/2018 0700 Gross per 24 hour  Intake 1195.33 ml  Output 800 ml  Net 395.33 ml   Filed Weights   07/04/18 2106  Weight: 102.1 kg    Exam:  . General: 72 y.o. year-old adult well-developed well-nourished in no acute distress.  She is alert and oriented x3.   . Cardiovascular: Regular rate and rhythm with no rubs or gallops.  No JVD or thyromegaly noted..   . Respiratory: Mild rales diffusely with no wheezes.  Poor inspiratory effort. . Abdomen: Soft nontender nondistended with normal bowel sounds x4 quadrants. . Musculoskeletal: Trace lower extremity edema. 2/4 pulses in all 4 extremities. Marland Kitchen Psychiatry: Mood is appropriate for condition and  setting   Data Reviewed: CBC: Recent Labs  Lab 07/04/18 1102 07/04/18 2218 07/05/18 0531 07/06/18 0400  WBC 14.1* 12.7* 11.3* 10.9*  NEUTROABS  --  11.0*  --  9.7*  HGB 7.4* 7.3* 7.4* 10.7*  HCT 22.4* 23.6* 24.2* 32.5*  MCV 88.9 89.7 91.3 89.5  PLT 232 214 234 976   Basic Metabolic Panel: Recent Labs  Lab 07/04/18 1102 07/05/18 0531 07/06/18 0400  NA 137 140 140  K 3.8 3.7 3.8  CL 104 106 106  CO2 23 21* 22  GLUCOSE 164* 128* 155*  BUN 45* 39* 31*  CREATININE 2.46* 2.50* 2.22*  CALCIUM 8.1* 8.0* 7.8*   GFR: Estimated Creatinine Clearance (by C-G formula based on SCr of 2.22 mg/dL (H)) Female: 27.5 mL/min (A) Female: 33.5 mL/min (A) Liver Function Tests: Recent Labs  Lab 07/04/18 1102  AST 30  ALT 19  ALKPHOS 33*  BILITOT 1.6*  PROT 5.9*  ALBUMIN 2.6*   No results for input(s): LIPASE, AMYLASE in the last 168 hours. No results for input(s): AMMONIA in the last 168 hours. Coagulation Profile: No results for input(s): INR, PROTIME in the last 168 hours. Cardiac Enzymes: Recent Labs  Lab 07/04/18 1102 07/04/18 1625 07/04/18 2218 07/05/18 0531  TROPONINI 0.40*  0.50* 0.48* 0.47*   BNP (last 3 results) No results for input(s): PROBNP in the last 8760 hours. HbA1C: No results for input(s): HGBA1C in the last 72 hours. CBG: No results for input(s): GLUCAP in the last 168 hours. Lipid Profile: No results for input(s): CHOL, HDL, LDLCALC, TRIG, CHOLHDL, LDLDIRECT in the last 72 hours. Thyroid Function Tests: No results for input(s): TSH, T4TOTAL, FREET4, T3FREE, THYROIDAB in the last 72 hours. Anemia Panel: No results for input(s): VITAMINB12, FOLATE, FERRITIN, TIBC, IRON, RETICCTPCT in the last 72 hours. Urine analysis: No results found for: COLORURINE, APPEARANCEUR, LABSPEC, [place], GLUCOSEU, HGBUR, BILIRUBINUR, [place], [place], UROBILINOGEN, NITRITE, LEUKOCYTESUR Sepsis Labs: @LABRCNTIP (procalcitonin:4,lacticidven:4)  ) Recent Results (from  the past 240 hour(s))  MRSA PCR Screening     Status: None   Collection Time: 07/04/18 11:57 PM  Result Value Ref Range Status   MRSA by PCR NEGATIVE NEGATIVE Final    Comment:        The GeneXpert MRSA Assay (FDA approved for NASAL specimens only), is one component of a comprehensive MRSA colonization surveillance program. It is not intended to diagnose MRSA infection nor to guide or monitor treatment for MRSA infections. Performed at [facility], [place] [address] [place], [place] [address]       Studies: Dg Chest Port 1 View  Result Date: 07/05/2018 CLINICAL DATA:  Acute respiratory distress EXAM: PORTABLE CHEST 1 VIEW COMPARISON:  Chest radiograph from one day prior. FINDINGS: Stable cardiomediastinal silhouette with mild cardiomegaly. No pneumothorax. No right pleural effusion. Stable blunting of the left costophrenic angle. Mild-to-moderate pulmonary appears slightly worsened. IMPRESSION: Mild-to-moderate congestive heart failure, slightly worsened. Stable small left pleural effusion. Electronically Signed   By: Ilona Sorrel M.D.   On: 07/05/2018 22:34    Scheduled Meds: . sodium chloride   Intravenous Once  . chlorhexidine  15 mL Mouth Rinse BID  . cloNIDine  0.2 mg Transdermal Q Sun  . dextromethorphan-guaiFENesin  2 tablet Oral BID  . furosemide  40 mg Intravenous Daily  . ipratropium-albuterol  3 mL Nebulization TID  . mouth rinse  15 mL Mouth Rinse q12n4p  . sodium chloride HYPERTONIC  4 mL Nebulization TID    Continuous Infusions: . ceFEPime (MAXIPIME) IV Stopped (07/06/18 0041)  . pantoprazole (PROTONIX) IV Stopped (07/06/18 0102)     LOS: 2 days     Kayleen Memos, MD [facility] Pager [phone]  If 7PM-7AM, please contact night-coverage [email or web address] Password [email or web address] 07/06/2018, 10:40 AM

## 2018-07-07 ENCOUNTER — Other Ambulatory Visit: Payer: Self-pay

## 2018-07-07 DIAGNOSIS — K264 Chronic or unspecified duodenal ulcer with hemorrhage: Principal | ICD-10-CM

## 2018-07-07 LAB — CBC WITH DIFFERENTIAL/PLATELET
Abs Immature Granulocytes: 0.02 10*3/uL (ref 0.00–0.07)
Basophils Absolute: 0 10*3/uL (ref 0.0–0.1)
Basophils Relative: 0 %
Eosinophils Absolute: 0 10*3/uL (ref 0.0–0.5)
Eosinophils Relative: 0 %
HCT: 35.1 % — ABNORMAL LOW (ref 36.0–46.0)
Hemoglobin: 11.3 g/dL — ABNORMAL LOW (ref 12.0–15.0)
Immature Granulocytes: 0 %
LYMPHS PCT: 3 %
Lymphs Abs: 0.2 10*3/uL — ABNORMAL LOW (ref 0.7–4.0)
MCH: 29.1 pg (ref 26.0–34.0)
MCHC: 32.2 g/dL (ref 30.0–36.0)
MCV: 90.5 fL (ref 80.0–100.0)
Monocytes Absolute: 0.1 10*3/uL (ref 0.1–1.0)
Monocytes Relative: 1 %
Neutro Abs: 7.1 10*3/uL (ref 1.7–7.7)
Neutrophils Relative %: 96 %
Platelets: 222 10*3/uL (ref 150–400)
RBC: 3.88 MIL/uL (ref 3.87–5.11)
RDW: 18.8 % — ABNORMAL HIGH (ref 11.5–15.5)
WBC: 7.4 10*3/uL (ref 4.0–10.5)
nRBC: 0 % (ref 0.0–0.2)

## 2018-07-07 LAB — BASIC METABOLIC PANEL
ANION GAP: 15 (ref 5–15)
BUN: 31 mg/dL — ABNORMAL HIGH (ref 8–23)
CO2: 18 mmol/L — ABNORMAL LOW (ref 22–32)
Calcium: 8 mg/dL — ABNORMAL LOW (ref 8.9–10.3)
Chloride: 106 mmol/L (ref 98–111)
Creatinine, Ser: 2.27 mg/dL — ABNORMAL HIGH (ref 0.44–1.00)
GFR calc Af Amer: 24 mL/min — ABNORMAL LOW (ref >60)
GFR calc non Af Amer: 21 mL/min — ABNORMAL LOW (ref >60)
Glucose, Bld: 209 mg/dL — ABNORMAL HIGH (ref 70–99)
Potassium: 4 mmol/L (ref 3.5–5.1)
Sodium: 139 mmol/L (ref 135–145)

## 2018-07-07 LAB — GLUCOSE, CAPILLARY
Glucose-Capillary: 181 mg/dL — ABNORMAL HIGH (ref 70–99)
Glucose-Capillary: 241 mg/dL — ABNORMAL HIGH (ref 70–99)
Glucose-Capillary: 173 mg/dL — ABNORMAL HIGH (ref 70–99)
Glucose-Capillary: 214 mg/dL — ABNORMAL HIGH (ref 70–99)

## 2018-07-07 LAB — IRON AND TIBC
IRON: 29 ug/dL (ref 28–170)
Saturation Ratios: 16 % (ref 10.4–31.8)
TIBC: 179 ug/dL — ABNORMAL LOW (ref 250–450)
UIBC: 150 ug/dL

## 2018-07-07 LAB — FERRITIN: Ferritin: 495 ng/mL — ABNORMAL HIGH (ref 11–307)

## 2018-07-07 NOTE — Social Work (Addendum)
2:19 pm Insurance information now updated. Sent to [employer] and they will start patient's [facility] auth today. Authorization needed before patient can admit to the SNF. CSW to follow.  12:49 pm Patient's daughter provided patient's insurance information and admitting department to add to patient's record. Will need this information on file so it can be sent to SNF, as SNF will need to obtain [facility] for rehab.   Patient did receive a bed offer from her first choice in facility, [facility]. CSW to follow.  Estanislado Emms, LCSW [phone]

## 2018-07-07 NOTE — Consult Note (Addendum)
Ref: Patient, No Pcp Per   Subjective:  No chest pain. No additional GI bleed.  Objective:  Vital Signs in the last 24 hours: Temp:  [97.6 F (36.4 C)-98.9 F (37.2 C)] 98.9 F (37.2 C) (12/31 1207) Pulse Rate:  [70-116] 73 (12/31 1411) Cardiac Rhythm: Normal sinus rhythm;Heart block (12/31 0700) Resp:  [16-31] 22 (12/31 1411) BP: (152-173)/(54-90) 173/63 (12/31 1207) SpO2:  [97 %-100 %] 98 % (12/31 1411) Weight:  [100.7 kg] 100.7 kg (12/31 0452)  Physical Exam: BP Readings from Last 1 Encounters:  07/07/18 (!) 173/63     Wt Readings from Last 1 Encounters:  07/07/18 100.7 kg    Weight change:  Body mass index is 36.94 kg/m. HEENT: [place]/AT, Eyes-Brown, PERL, EOMI, Conjunctiva-Pink, Sclera-Non-icteric Neck: No JVD, No bruit, Trachea midline. Lungs:  Clearing, Bilateral. Cardiac:  Regular rhythm, normal S1 and S2, no S3. II/VI systolic murmur. Abdomen:  Soft, non-tender. BS present. Extremities:  No edema present. No cyanosis. No clubbing. CNS: AxOx3, Cranial nerves grossly intact, moves all 4 extremities.  Skin: Warm and dry.   Intake/Output from previous day: 12/30 0701 - 12/31 0700 In: 100 [IV Piggyback:100] Out: -     Lab Results: BMET    Component Value Date/Time   NA 139 07/07/2018 0350   NA 140 07/06/2018 0400   NA 140 07/05/2018 0531   K 4.0 07/07/2018 0350   K 3.8 07/06/2018 0400   K 3.7 07/05/2018 0531   CL 106 07/07/2018 0350   CL 106 07/06/2018 0400   CL 106 07/05/2018 0531   CO2 18 (L) 07/07/2018 0350   CO2 22 07/06/2018 0400   CO2 21 (L) 07/05/2018 0531   GLUCOSE 209 (H) 07/07/2018 0350   GLUCOSE 155 (H) 07/06/2018 0400   GLUCOSE 128 (H) 07/05/2018 0531   BUN 31 (H) 07/07/2018 0350   BUN 31 (H) 07/06/2018 0400   BUN 39 (H) 07/05/2018 0531   CREATININE 2.27 (H) 07/07/2018 0350   CREATININE 2.22 (H) 07/06/2018 0400   CREATININE 2.50 (H) 07/05/2018 0531   CALCIUM 8.0 (L) 07/07/2018 0350   CALCIUM 7.8 (L) 07/06/2018 0400   CALCIUM 8.0 (L)  07/05/2018 0531   GFRNONAA 21 (L) 07/07/2018 0350   GFRNONAA 22 (L) 07/06/2018 0400   GFRNONAA 19 (L) 07/05/2018 0531   GFRAA 24 (L) 07/07/2018 0350   GFRAA 25 (L) 07/06/2018 0400   GFRAA 22 (L) 07/05/2018 0531   CBC    Component Value Date/Time   WBC 7.4 07/07/2018 0350   RBC 3.88 07/07/2018 0350   HGB 11.3 (L) 07/07/2018 0350   HCT 35.1 (L) 07/07/2018 0350   PLT 222 07/07/2018 0350   MCV 90.5 07/07/2018 0350   MCH 29.1 07/07/2018 0350   MCHC 32.2 07/07/2018 0350   RDW 18.8 (H) 07/07/2018 0350   LYMPHSABS 0.2 (L) 07/07/2018 0350   MONOABS 0.1 07/07/2018 0350   EOSABS 0.0 07/07/2018 0350   BASOSABS 0.0 07/07/2018 0350   HEPATIC Function Panel Recent Labs    07/04/18 1102  PROT 5.9*   HEMOGLOBIN A1C No components found for: HGA1C,  MPG CARDIAC ENZYMES Lab Results  Component Value Date   TROPONINI 0.47 (HH) 07/05/2018   TROPONINI 0.48 (HH) 07/04/2018   TROPONINI 0.50 (HH) 07/04/2018   BNP No results for input(s): PROBNP in the last 8760 hours. TSH No results for input(s): TSH in the last 8760 hours. CHOLESTEROL No results for input(s): CHOL in the last 8760 hours.  Scheduled Meds: . sodium chloride  Intravenous Once  . carvedilol  25 mg Oral BID  . chlorhexidine  15 mL Mouth Rinse BID  . cloNIDine  0.2 mg Transdermal Q Sun  . dextromethorphan-guaiFENesin  2 tablet Oral BID  . diphenhydrAMINE  12.5 mg Intravenous BID  . ezetimibe  10 mg Oral Daily  . furosemide  40 mg Intravenous Daily  . insulin aspart  0-5 Units Subcutaneous QHS  . insulin aspart  0-9 Units Subcutaneous TID WC  . ipratropium-albuterol  3 mL Nebulization TID  . mouth rinse  15 mL Mouth Rinse q12n4p  . methylPREDNISolone (SOLU-MEDROL) injection  40 mg Intravenous Q8H  . multivitamin with minerals  1 tablet Oral Daily  . NIFEdipine  60 mg Oral BID  . sodium chloride HYPERTONIC  4 mL Nebulization TID   Continuous Infusions: . ceFEPime (MAXIPIME) IV 1 g (07/06/18 2100)  . pantoprazole  (PROTONIX) IV 80 mg (07/07/18 0901)   PRN Meds:.acetaminophen, albuterol, hydrALAZINE, metoprolol tartrate, ondansetron (ZOFRAN) IV  Assessment/Plan: Acute GI bleed Symptomatic anemia, improved Abnormal troponin I from demand ischemia H/O PE HTN Type 2 DM CKD, IV Morbid obesity Rheumatoid arthritis  Medical treatment.   LOS: 3 days    Dixie Dials  MD  07/07/2018, 2:42 PM

## 2018-07-07 NOTE — Progress Notes (Addendum)
PROGRESS NOTE  Jade Mclean WCH:[id number] DOB: May 02, 1946 DOA: 07/04/2018 PCP: Patient, No Pcp Per  HPI/Recap of past 24 hours:  Jade Mclean is a 72 y.o. female with past medical history significant for recently diagnosed pulmonary embolism on Eliquis and unspecified CHF, CKD 4, type 2 diabetes, hypertension, who presented from home last night via EMS to [facility] ED due to extreme fatigue and somnolence as noted by family members.  She was transported to [facility] ED where she was found to have a hemoglobin of 5 and a positive FOBT.  No GI services at [facility] at that time therefore the patient was transferred to [facility] for possible GI evaluation.  Patient received 2 units of PRBCs.  Repeat H&H 7.4. Pt still symptomatic.additional 2 unit PRBCs to be transfused today.  07/05/2018: Patient seen and examined at her bedside.  She is alert and oriented x3 however she is still very somnolent prior to the 2 additional units of PRBCs to be transfused.  EGD completed by Dr. Collene Mares with findings of multiple large superficial duodenal ulcers.  Eliquis has been on hold for the last 2 days.  Interventional radiology has been consulted for possible IVC filter placement ASAP.  Discussed with interventional radiology who reviewed the VQ scan reported to be negative.  Initial positive results were due to artifacts.  07/06/2018: Patient seen and examined at bedside.  Rapid response called overnight due to respiratory distress after blood transfusion.  Chest x-ray done independently reviewed which revealed mild increase in pulmonary vascularity suggestive of mild pulmonary edema.  Given 1 dose of IV Lasix 40 mg.  We will give another dose of 40 mg of IV Lasix.  ABG done which was unremarkable.  Patient placed on 3 L of oxygen by nasal cannula saturating 100%.  07/07/2018: Patient seen and examined at bedside.  Yesterday had a diffuse rash with unclear etiology and was treated for allergic reaction.  Rash  is resolving.  States she feels much better today.  Assessment/Plan: Active Problems:   GI bleeding   Acute blood loss anemia  Acute upper GI bleed secondary to multiple large superficial duodenal ulcers Findings from EGD done by GI Dr. Collene Mares on 07/05/2018 Continue Protonix IV 80 mg twice daily Discontinue Eliquis due to negative VQ scan No evidence of PE or prior PE. Currently on clear liquid diet as recommended by GI Defer to GI to advance diet Received 4 units of PRBCs Hemoglobin 11.3 from 10.7 No sign of overt bleeding at this time  Acute on chronic blood loss anemia suspect secondary to multiple large superficial duodenal ulcers erosions Management as stated above In addition obtain iron studies Continue to monitor H&H Repeat CBC in the morning  Diffuse rash of unclear etiology Treated yesterday for allergic reaction with IV Solu-Medrol and IV Benadryl Rash is resolving Continue to closely monitor  Improving acute hypoxic respiratory failure suspect secondary to acute pulmonary edema from blood transfusion Received IV Lasix total of 80 mg today O2 saturation 100% on 3 L currently Wean off oxygen as tolerated  Bilateral lower extremity tenderness Bilateral lower extremity duplex ultrasound pending  Elevated troponin Trending down Peaked at 0.50 on 07/04/2018 Cardiology consulted and cleared for endoscopy which was done on 07/05/2018  Suspected HCAP, present on admission Continue IV cefepime Procalcitonin trending down Monitor for fever WBCs trending down  Recently diagnosed unspecified CHF BNP elevated greater than 17,000 We will need to obtain last 2D echo records from [facility] Cardiology consult for clearance due  to possible endoscopy Continue strict I's and O's and daily weight  Recently diagnosed acute PE; ruled out VQ scan reviewed by interventional radiology and reported to be low probability for PE Prior positive results were due to  artifacts  Uncontrolled Hypertension On clonidine 0.2 mg twice daily Resume home dose Coreg 25 mg twice daily and nifedipine Monitor vital signs closely Hydralazine IV 10 mg every 6 hours PRN for systolic blood pressure greater than 671 or diastolic greater than 245 Also on Lasix IV 40 mg daily  Type 2 diabetes Hemoglobin A1c 4.7 on 07/06/2018 Avoid hypoglycemia  CKD 4 Unclear of her baseline Presented with creatinine 2.30 on 07/04/2018 Creatinine today 2.27 from 2.27 Continue to monitor urine output Avoid nephrotoxic agents Repeat BMP in the morning  Ambulatory dysfunction with falls/physical debility Uses a walker and cane at baseline PT assessed and recommended SNF Patient is agreeable to SNF Social worker consulted for placement  Rheumatoid arthritis Appears stable Denies use of NSAIDs On Enbrel qFriday    Risks: High risk for decompensation due to severe symptomatic anemia with hemoglobin of 5, acute GI bleed secondary to multiple superficial duodenal ulcers, elevated troponin, multiple comorbidities and advanced age.  DVT prophylaxis: SCDs.  VTE pharmacological prophylaxis contraindicated in the setting of suspected upper GI bleed.  Code Status: DNR  Family Communication:  Updated niece at bedside.  All questions answered to her satisfaction.  Disposition Plan:  Discharge to SNF once insurance authorizes.  Consults called: GI, Dr. Collene Mares; cardiology        Objective: Vitals:   07/07/18 0452 07/07/18 0751 07/07/18 1207 07/07/18 1411  BP: (!) 157/56 (!) 153/55 (!) 173/63   Pulse: 70 71 71 73  Resp: (!) 29 (!) 27 (!) 24 (!) 22  Temp: 97.6 F (36.4 C) 98.8 F (37.1 C) 98.9 F (37.2 C)   TempSrc: Oral Oral Oral   SpO2: 98% 99% 100% 98%  Weight: 100.7 kg     Height:        Intake/Output Summary (Last 24 hours) at 07/07/2018 1541 Last data filed at 07/07/2018 1328 Gross per 24 hour  Intake 820 ml  Output 400 ml  Net 420 ml    Filed Weights   07/04/18 2106 07/07/18 0452  Weight: 102.1 kg 100.7 kg    Exam:  . General: 72 y.o. year-old adult well-developed well-nourished in no acute distress.  Alert and oriented x3. . Cardiovascular: Regular rhythm with no rubs or gallops.  No JVD or thyromegaly noted. Marland Kitchen Respiratory: Mild rales at bases with no wheezes.  Good inspiratory effort. . Abdomen: Soft nontender nondistended with normal bowel sounds x4 quadrants. . Musculoskeletal: Trace lower extremity edema. 2/4 pulses in all 4 extremities. Marland Kitchen Psychiatry: Mood is appropriate for condition and setting   Data Reviewed: CBC: Recent Labs  Lab 07/04/18 1102 07/04/18 2218 07/05/18 0531 07/06/18 0400 07/07/18 0350  WBC 14.1* 12.7* 11.3* 10.9* 7.4  NEUTROABS  --  11.0*  --  9.7* 7.1  HGB 7.4* 7.3* 7.4* 10.7* 11.3*  HCT 22.4* 23.6* 24.2* 32.5* 35.1*  MCV 88.9 89.7 91.3 89.5 90.5  PLT 232 214 234 213 809   Basic Metabolic Panel: Recent Labs  Lab 07/04/18 1102 07/05/18 0531 07/06/18 0400 07/07/18 0350  NA 137 140 140 139  K 3.8 3.7 3.8 4.0  CL 104 106 106 106  CO2 23 21* 22 18*  GLUCOSE 164* 128* 155* 209*  BUN 45* 39* 31* 31*  CREATININE 2.46* 2.50* 2.22* 2.27*  CALCIUM  8.1* 8.0* 7.8* 8.0*   GFR: Estimated Creatinine Clearance (by C-G formula based on SCr of 2.27 mg/dL (H)) Female: 26.7 mL/min (A) Female: 32.6 mL/min (A) Liver Function Tests: Recent Labs  Lab 07/04/18 1102  AST 30  ALT 19  ALKPHOS 33*  BILITOT 1.6*  PROT 5.9*  ALBUMIN 2.6*   No results for input(s): LIPASE, AMYLASE in the last 168 hours. No results for input(s): AMMONIA in the last 168 hours. Coagulation Profile: No results for input(s): INR, PROTIME in the last 168 hours. Cardiac Enzymes: Recent Labs  Lab 07/04/18 1102 07/04/18 1625 07/04/18 2218 07/05/18 0531  TROPONINI 0.40* 0.50* 0.48* 0.47*   BNP (last 3 results) No results for input(s): PROBNP in the last 8760 hours. HbA1C: Recent Labs    07/06/18 0400   HGBA1C 4.7*   CBG: Recent Labs  Lab 07/06/18 1723 07/07/18 0757 07/07/18 1102  GLUCAP 152* 181* 241*   Lipid Profile: No results for input(s): CHOL, HDL, LDLCALC, TRIG, CHOLHDL, LDLDIRECT in the last 72 hours. Thyroid Function Tests: No results for input(s): TSH, T4TOTAL, FREET4, T3FREE, THYROIDAB in the last 72 hours. Anemia Panel: No results for input(s): VITAMINB12, FOLATE, FERRITIN, TIBC, IRON, RETICCTPCT in the last 72 hours. Urine analysis: No results found for: COLORURINE, APPEARANCEUR, LABSPEC, [place], GLUCOSEU, HGBUR, BILIRUBINUR, [place], [place], UROBILINOGEN, NITRITE, LEUKOCYTESUR Sepsis Labs: @LABRCNTIP (procalcitonin:4,lacticidven:4)  ) Recent Results (from the past 240 hour(s))  MRSA PCR Screening     Status: None   Collection Time: 07/04/18 11:57 PM  Result Value Ref Range Status   MRSA by PCR NEGATIVE NEGATIVE Final    Comment:        The GeneXpert MRSA Assay (FDA approved for NASAL specimens only), is one component of a comprehensive MRSA colonization surveillance program. It is not intended to diagnose MRSA infection nor to guide or monitor treatment for MRSA infections. Performed at [facility], [place] [address] [place], [place] [address]       Studies: No results found.  Scheduled Meds: . sodium chloride   Intravenous Once  . carvedilol  25 mg Oral BID  . chlorhexidine  15 mL Mouth Rinse BID  . cloNIDine  0.2 mg Transdermal Q Sun  . dextromethorphan-guaiFENesin  2 tablet Oral BID  . diphenhydrAMINE  12.5 mg Intravenous BID  . ezetimibe  10 mg Oral Daily  . furosemide  40 mg Intravenous Daily  . insulin aspart  0-5 Units Subcutaneous QHS  . insulin aspart  0-9 Units Subcutaneous TID WC  . ipratropium-albuterol  3 mL Nebulization TID  . mouth rinse  15 mL Mouth Rinse q12n4p  . methylPREDNISolone (SOLU-MEDROL) injection  40 mg Intravenous Q8H  . multivitamin with minerals  1 tablet Oral Daily  . NIFEdipine  60 mg Oral BID  .  sodium chloride HYPERTONIC  4 mL Nebulization TID    Continuous Infusions: . ceFEPime (MAXIPIME) IV 1 g (07/06/18 2100)  . pantoprazole (PROTONIX) IV 80 mg (07/07/18 0901)     LOS: 3 days     Kayleen Memos, MD [facility] Pager [phone]  If 7PM-7AM, please contact night-coverage [email or web address] Password [email or web address] 07/07/2018, 3:41 PM

## 2018-07-07 NOTE — Progress Notes (Signed)
Physical Therapy Treatment Patient Details Name: Jade Mclean MRN: [id number] DOB: [birth year] Today's Date: 07/07/2018    History of Present Illness Pt adm to [facility] with acute GI bleed. Pt with recent PE and started blood thinners. Pt transfused 2 units of blood and transferred to [facility] for GI evaluation. Cardiac eval for elevated troponin and concluded abnormal troponin due to demand ischemia. PMH - recent PE, recent diagnosis chf, DM, HTN, CKD, morbid obesitiy.    PT Comments    Pt admitted with above diagnosis. Pt currently with functional limitations due to balance and endurance deficits. Pt was able to pivot with min assist of 2 and RW.  Pt will need SNF as she has weakness and poor endurance.  Changed d/c plan.  Pt will benefit from skilled PT to increase their independence and safety with mobility to allow discharge to the venue listed below.     Follow Up Recommendations  Supervision/Assistance - 24 hour;SNF     Equipment Recommendations  None recommended by PT    Recommendations for Other Services OT consult     Precautions / Restrictions Precautions Precautions: Fall Restrictions Weight Bearing Restrictions: No    Mobility  Bed Mobility Overal bed mobility: Needs Assistance Bed Mobility: Supine to Sit;Sit to Supine;Rolling Rolling: Min assist   Supine to sit: Min assist Sit to supine: Min assist   General bed mobility comments: Assist to elevate trunk into sitting.   Transfers Overall transfer level: Needs assistance Equipment used: Rolling walker (2 wheeled) Transfers: Sit to/from Stand Sit to Stand: Min assist;+2 safety/equipment         General transfer comment: Pt able to stand to RW and pivot with min assist due to left knee instability.   Ambulation/Gait                 Stairs             Wheelchair Mobility    Modified Rankin (Stroke Patients Only)       Balance Overall balance assessment: Needs  assistance Sitting-balance support: No upper extremity supported;Feet supported Sitting balance-Leahy Scale: Fair     Standing balance support: Bilateral upper extremity supported;During functional activity Standing balance-Leahy Scale: Poor Standing balance comment: relies on UE support                            Cognition Arousal/Alertness: Awake/alert Behavior During Therapy: WFL for tasks assessed/performed Overall Cognitive Status: Within Functional Limits for tasks assessed                                        Exercises General Exercises - Lower Extremity Long Arc Quad: AROM;Both;10 reps;Seated    General Comments        Pertinent Vitals/Pain Pain Assessment: No/denies pain    Home Living                      Prior Function            PT Goals (current goals can now be found in the care plan section) Acute Rehab PT Goals Patient Stated Goal: return home Progress towards PT goals: Progressing toward goals    Frequency    Min 3X/week      PT Plan Discharge plan needs to be updated    Co-evaluation  AM-PAC PT "6 Clicks" Mobility   Outcome Measure  Help needed turning from your back to your side while in a flat bed without using bedrails?: A Little Help needed moving from lying on your back to sitting on the side of a flat bed without using bedrails?: A Little Help needed moving to and from a bed to a chair (including a wheelchair)?: A Little Help needed standing up from a chair using your arms (e.g., wheelchair or bedside chair)?: A Little Help needed to walk in hospital room?: Total Help needed climbing 3-5 steps with a railing? : Total 6 Click Score: 14    End of Session Equipment Utilized During Treatment: Oxygen;Gait belt Activity Tolerance: Patient limited by fatigue Patient left: with call bell/phone within reach;in chair;with chair alarm set Nurse Communication: Mobility status PT  Visit Diagnosis: Other abnormalities of gait and mobility (R26.89);Muscle weakness (generalized) (M62.81)     Time: 4888-9169 PT Time Calculation (min) (ACUTE ONLY): 17 min  Charges:  $Therapeutic Activity: 8-22 mins                     [place] Pager:  [phone]  Office:  [place] 07/07/2018, 11:51 AM

## 2018-07-07 NOTE — Progress Notes (Signed)
RT Note:  Patient does not feel she needs Bipap at this time.

## 2018-07-07 NOTE — Progress Notes (Signed)
Pharmacy Antibiotic Note  Jade Mclean is a 72 y.o. adult admitted on 07/04/2018 with pneumonia.  Pharmacy has been consulted for cefepime dosing.  Day 3 of tx with cefepime.  SCr 2.27, CrCl~ 96mL/min, unknown baseline.  Procal downtrend, AF, WBC wnl, LA > 0.8  Plan: Cefepime 1g IV every 24 hours Monitor renal function, LOT Consider de-escalation  Height: 5\' 5"  (165.1 cm) Weight: 222 lb 0.1 oz (100.7 kg) IBW/kg (Calculated) : 57  Temp (24hrs), Avg:98.1 F (36.7 C), Min:97.6 F (36.4 C), Max:98.8 F (37.1 C)  Recent Labs  Lab 07/04/18 1102 07/04/18 2218 07/04/18 2338 07/05/18 0531 07/06/18 0400 07/07/18 0350  WBC 14.1* 12.7*  --  11.3* 10.9* 7.4  CREATININE 2.46*  --   --  2.50* 2.22* 2.27*  LATICACIDVEN  --   --  0.8 0.8  --   --     Estimated Creatinine Clearance (by C-G formula based on SCr of 2.27 mg/dL (H)) Female: 26.7 mL/min (A) Female: 32.6 mL/min (A)    Allergies  Allergen Reactions  . Colesevelam     Other reaction(s): GI Upset (intolerance)  . Dulaglutide     Other reaction(s): GI Upset (intolerance)  . Rosuvastatin     Other reaction(s): Myalgias (intolerance)  . Simvastatin     Other reaction(s): Myalgias (intolerance)    Antimicrobials this admission: Cefepime 12/29 >>  12/28 MRSA PCR: negative  Bertis Ruddy, PharmD Clinical Pharmacist Please check AMION for all [place] numbers 07/07/2018 10:54 AM

## 2018-07-07 NOTE — Progress Notes (Signed)
     [facility] Progress Note   Chief Complaint:   Anemia / heme + stool    SUBJECTIVE:    more awake today. No complaints. No BMs / bleeding today   ASSESSMENT AND PLAN:   Normocytic anemia / heme + stool on Eliquis ( recent PE). EGD >> a few gastric erosions and 3 large superficial duodenal ulcers.  -No further bleeding. No BMs today.  -surprisingly hgb continues to rise after pRBCs on 12/29.  -continue BID PPI, can transition to PO tomorrow  OBJECTIVE:     Vital signs in last 24 hours: Temp:  [97.6 F (36.4 C)-98.9 F (37.2 C)] 98.9 F (37.2 C) (12/31 1207) Pulse Rate:  [70-116] 73 (12/31 1411) Resp:  [19-31] 22 (12/31 1411) BP: (152-173)/(54-90) 173/63 (12/31 1207) SpO2:  [97 %-100 %] 98 % (12/31 1411) Weight:  [100.7 kg] 100.7 kg (12/31 0452) Last BM Date: 07/06/18(per pt) General:   Alert, obese female in NAD EENT:  Normal hearing, non icteric sclera, conjunctive pink.  Heart:  Regular rate and rhythm  No lower extremity edema   Pulm: Normal respiratory effort. Abdomen:  Soft, nondistended, nontender.  Normal bowel sounds, no masses felt.       Neurologic:  Alert and  oriented x4;  grossly normal neurologically. Psych:  Pleasant, cooperative.  Normal mood and affect.   Intake/Output from previous day: 12/30 0701 - 12/31 0700 In: 100 [IV Piggyback:100] Out: -  Intake/Output this shift: Total I/O In: 720 [P.O.:720] Out: 400 [Urine:400]  Lab Results: Recent Labs    07/05/18 0531 07/06/18 0400 07/07/18 0350  WBC 11.3* 10.9* 7.4  HGB 7.4* 10.7* 11.3*  HCT 24.2* 32.5* 35.1*  PLT 234 213 222   BMET Recent Labs    07/05/18 0531 07/06/18 0400 07/07/18 0350  NA 140 140 139  K 3.7 3.8 4.0  CL 106 106 106  CO2 21* 22 18*  GLUCOSE 128* 155* 209*  BUN 39* 31* 31*  CREATININE 2.50* 2.22* 2.27*  CALCIUM 8.0* 7.8* 8.0*     Dg Chest Port 1 View  Result Date: 07/05/2018 CLINICAL DATA:  Acute respiratory distress EXAM: PORTABLE  CHEST 1 VIEW COMPARISON:  Chest radiograph from one day prior. FINDINGS: Stable cardiomediastinal silhouette with mild cardiomegaly. No pneumothorax. No right pleural effusion. Stable blunting of the left costophrenic angle. Mild-to-moderate pulmonary appears slightly worsened. IMPRESSION: Mild-to-moderate congestive heart failure, slightly worsened. Stable small left pleural effusion. Electronically Signed   By: Ilona Sorrel M.D.   On: 07/05/2018 22:34     LOS: 3 days   Tye Savoy ,NP 07/07/2018, 3:09 PM

## 2018-07-07 NOTE — Consult Note (Signed)
[place] Nurse wound consult note Reason for Consult: abdomen, buttocks, coccyx, MASD Wound type:no open wounds that I can identify today at the time of my assessment Patient has vertiligo, not sure if areas on her posterior thighs and coccyx were mistaken for open wounds. No redness under the pannus, however Interdry AG+ in place.  Pressure Injury POA: NA Measurement:none Wound QLR:JPVG Drainage (amount, consistency, odor) none Periwound:none Dressing procedure/placement/frequency:no topical treatment recommended at this time   Discussed POC with patient and bedside nurse.  Re consult if needed, will not follow at this time. Thanks  Demyan Fugate [employer], RN,CWOCN, CNS, [place] [phone])

## 2018-07-08 LAB — CBC WITH DIFFERENTIAL/PLATELET
Abs Immature Granulocytes: 0.05 10*3/uL (ref 0.00–0.07)
Basophils Absolute: 0 10*3/uL (ref 0.0–0.1)
Basophils Relative: 0 %
Eosinophils Absolute: 0 10*3/uL (ref 0.0–0.5)
Eosinophils Relative: 0 %
HCT: 30.7 % — ABNORMAL LOW (ref 36.0–46.0)
Hemoglobin: 9.8 g/dL — ABNORMAL LOW (ref 12.0–15.0)
Immature Granulocytes: 1 %
Lymphocytes Relative: 3 %
Lymphs Abs: 0.3 10*3/uL — ABNORMAL LOW (ref 0.7–4.0)
MCH: 28.6 pg (ref 26.0–34.0)
MCHC: 31.9 g/dL (ref 30.0–36.0)
MCV: 89.5 fL (ref 80.0–100.0)
Monocytes Absolute: 0.4 10*3/uL (ref 0.1–1.0)
Monocytes Relative: 5 %
Neutro Abs: 7.3 10*3/uL (ref 1.7–7.7)
Neutrophils Relative %: 91 %
Platelets: 197 10*3/uL (ref 150–400)
RBC: 3.43 MIL/uL — ABNORMAL LOW (ref 3.87–5.11)
RDW: 18.1 % — ABNORMAL HIGH (ref 11.5–15.5)
WBC: 8 10*3/uL (ref 4.0–10.5)
nRBC: 0.3 % — ABNORMAL HIGH (ref 0.0–0.2)

## 2018-07-08 LAB — HEMOGLOBIN AND HEMATOCRIT, BLOOD
HEMATOCRIT: 30.2 % — AB (ref 36.0–46.0)
Hemoglobin: 10 g/dL — ABNORMAL LOW (ref 12.0–15.0)

## 2018-07-08 LAB — GLUCOSE, CAPILLARY
GLUCOSE-CAPILLARY: 117 mg/dL — AB (ref 70–99)
Glucose-Capillary: 163 mg/dL — ABNORMAL HIGH (ref 70–99)
Glucose-Capillary: 147 mg/dL — ABNORMAL HIGH (ref 70–99)
Glucose-Capillary: 113 mg/dL — ABNORMAL HIGH (ref 70–99)

## 2018-07-08 LAB — BASIC METABOLIC PANEL
Anion gap: 7 (ref 5–15)
BUN: 41 mg/dL — ABNORMAL HIGH (ref 8–23)
CO2: 27 mmol/L (ref 22–32)
Calcium: 8 mg/dL — ABNORMAL LOW (ref 8.9–10.3)
Chloride: 104 mmol/L (ref 98–111)
Creatinine, Ser: 2.28 mg/dL — ABNORMAL HIGH (ref 0.44–1.00)
GFR calc non Af Amer: 21 mL/min — ABNORMAL LOW (ref >60)
GFR, EST AFRICAN AMERICAN: 24 mL/min — AB (ref >60)
Glucose, Bld: 194 mg/dL — ABNORMAL HIGH (ref 70–99)
Potassium: 3.9 mmol/L (ref 3.5–5.1)
Sodium: 138 mmol/L (ref 135–145)

## 2018-07-08 LAB — PROCALCITONIN: Procalcitonin: 0.37 ng/mL

## 2018-07-08 MED ORDER — POLYETHYLENE GLYCOL 3350 17 G PO PACK
17.0000 g | PACK | Freq: Every day | ORAL | Status: DC
  Administered 2018-07-08 – 2018-07-12 (×3): 17 g via ORAL
  Filled 2018-07-08 (×5): qty 1

## 2018-07-08 MED ORDER — SENNOSIDES-DOCUSATE SODIUM 8.6-50 MG PO TABS
2.0000 | ORAL_TABLET | Freq: Two times a day (BID) | ORAL | Status: DC
  Administered 2018-07-09 – 2018-07-13 (×7): 2 via ORAL
  Filled 2018-07-08 (×9): qty 2

## 2018-07-08 MED ORDER — IPRATROPIUM-ALBUTEROL 0.5-2.5 (3) MG/3ML IN SOLN: 3.0000 mL | Freq: Four times a day (QID) | RESPIRATORY_TRACT | Status: DC | PRN

## 2018-07-08 NOTE — Progress Notes (Signed)
   07/08/18 0832  PT Visit Information  Last PT Received On 07/08/18  Restrictions  Weight Bearing Restrictions No   PT - Assessment/Plan  PT Frequency (ACUTE ONLY) Min 2X/week  Changed frequency to 2x week due to this is appropriate frequency for pts going to SNF per frequency guidelines.  Thanks.  [place] Pager:  [phone]  Office:  [phone]

## 2018-07-08 NOTE — Care Management Important Message (Signed)
Important Message  Patient Details  Name: Jade Mclean MRN: [id number] Date of Birth: 1946/04/05   Medicare Important Message Given:  Yes    [name] 07/08/2018, 1:02 PM

## 2018-07-08 NOTE — Progress Notes (Signed)
PROGRESS NOTE  Jade Mclean RSW:[id number] DOB: 1946/05/18 DOA: 07/04/2018 PCP: Patient, No Pcp Per  HPI/Recap of past 24 hours:  Jade Mclean is a 73 y.o. female with past medical history significant for recently diagnosed pulmonary embolism on Eliquis and unspecified CHF, CKD 4, type 2 diabetes, hypertension, who presented from home last night via EMS to [facility] ED due to extreme fatigue and somnolence as noted by family members.  She was transported to [facility] ED where she was found to have a hemoglobin of 5 and a positive FOBT.  No GI services at [facility] at that time therefore the patient was transferred to [facility] for possible GI evaluation.  Patient received 2 units of PRBCs.  Repeat H&H 7.4. Pt still symptomatic.additional 2 unit PRBCs to be transfused today.  07/05/2018: Patient seen and examined at her bedside.  She is alert and oriented x3 however she is still very somnolent prior to the 2 additional units of PRBCs to be transfused.  EGD completed by Dr. Collene Mares with findings of multiple large superficial duodenal ulcers.  Eliquis has been on hold for the last 2 days.  Interventional radiology has been consulted for possible IVC filter placement ASAP.  Discussed with interventional radiology who reviewed the VQ scan reported to be negative.  Initial positive results were due to artifacts.  07/06/2018: Patient seen and examined at bedside.  Rapid response called overnight due to respiratory distress after blood transfusion.  Chest x-ray done independently reviewed which revealed mild increase in pulmonary vascularity suggestive of mild pulmonary edema.  Given 1 dose of IV Lasix 40 mg.  We will give another dose of 40 mg of IV Lasix.  ABG done which was unremarkable.  Patient placed on 3 L of oxygen by nasal cannula saturating 100%.  07/07/2018: Patient seen and examined at bedside.  Yesterday had a diffuse rash with unclear etiology and was treated for allergic reaction.  Rash  is resolving.  States she feels much better today.  07/08/2018: Patient seen and examined at her bedside.  She states she is feeling better breathing better also.  Rash has resolved.  She has no new complaints.  Hemoglobin dropped this morning.  Reports constipation, no bowel movements for at least 3 days.  Start bowel regimen.  Assessment/Plan: Active Problems:   GI bleeding   Acute blood loss anemia  Acute upper GI bleed secondary to multiple large superficial duodenal ulcers Findings from EGD done by GI Dr. Collene Mares on 07/05/2018 Continue Protonix IV 80 mg twice daily Discontinue Eliquis due to negative VQ scan No evidence of PE or prior PE. Currently on clear liquid diet as recommended by GI Defer to GI to advance diet Received 4 units of PRBCs Hemoglobin 10.0 from 11.3 No sign of overt bleeding at this time  Acute on chronic blood loss anemia/iron deficiency anemia suspect secondary to multiple large superficial duodenal ulcers erosions Management as stated above Iron deficiency Start ferrous sulfate 325 mg daily Continue to monitor H&H and repeat CBC in the morning  Chronic constipation Start bowel regimen with 2 tablets of Senokot twice daily and MiraLAX daily  Resolving diffuse rash of unclear etiology Treated on 07/06/2018 for allergic reaction with IV Solu-Medrol and IV Benadryl Rash is resolving Continue to closely monitor  Improving acute hypoxic respiratory failure suspect secondary to acute pulmonary edema from blood transfusion Received IV Lasix total of 80 mg today O2 saturation 100% on 3 L currently Wean off oxygen as tolerated  Bilateral lower  extremity tenderness Bilateral lower extremity duplex ultrasound pending  Elevated troponin Trending down Peaked at 0.50 on 07/04/2018 Cardiology consulted and cleared for endoscopy which was done on 07/05/2018  Suspected HCAP, present on admission Continue IV cefepime Procalcitonin trending down Monitor for  fever WBCs trending down  Recently diagnosed unspecified CHF BNP elevated greater than 17,000 We will need to obtain last 2D echo records from [facility] Cardiology consult for clearance due to possible endoscopy Continue strict I's and O's and daily weight  Recently diagnosed acute PE; ruled out VQ scan reviewed by interventional radiology and reported to be low probability for PE Prior positive results were due to artifacts  Uncontrolled Hypertension On clonidine 0.2 mg twice daily Resume home dose Coreg 25 mg twice daily and nifedipine Monitor vital signs closely Hydralazine IV 10 mg every 6 hours PRN for systolic blood pressure greater than 545 or diastolic greater than 625 Also on Lasix IV 40 mg daily  Type 2 diabetes Hemoglobin A1c 4.7 on 07/06/2018 Avoid hypoglycemia  CKD 4 Unclear of her baseline Presented with creatinine 2.30 on 07/04/2018 Creatinine today 2.28 from 2.27 Continue to monitor urine output Avoid nephrotoxic agents Repeat BMP in the morning  Ambulatory dysfunction with falls/physical debility Uses a walker and cane at baseline PT assessed and recommended SNF Patient is agreeable to SNF Social worker consulted for placement  Rheumatoid arthritis Appears stable Denies use of NSAIDs On Enbrel qFriday    Risks: High risk for decompensation due to severe symptomatic anemia with hemoglobin of 5, acute GI bleed secondary to multiple superficial duodenal ulcers, elevated troponin, multiple comorbidities and advanced age.  DVT prophylaxis: SCDs.  VTE pharmacological prophylaxis contraindicated in the setting of suspected upper GI bleed.  Code Status: DNR  Family Communication:  Updated niece at bedside.  All questions answered to her satisfaction.  Disposition Plan:  Discharge to SNF possibly tomorrow 07-09-18  Consults called: GI, Dr. Collene Mares; cardiology        Objective: Vitals:   07/08/18 0751 07/08/18 0810 07/08/18 0820  07/08/18 1300  BP:  (!) 145/66  (!) 150/69  Pulse: 71 77  73  Resp: 14 (!) 22 (!) 25 (!) 23  Temp:      TempSrc:      SpO2: 96% 100% 100%   Weight:      Height:        Intake/Output Summary (Last 24 hours) at 07/08/2018 1533 Last data filed at 07/07/2018 2339 Gross per 24 hour  Intake 460 ml  Output -  Net 460 ml   Filed Weights   07/04/18 2106 07/07/18 0452 07/08/18 0640  Weight: 102.1 kg 100.7 kg 102.2 kg    Exam:  . General: 73 y.o. year-old adult obese no acute distress.  Alert and oriented x3. . Cardiovascular: Regular rate and rhythm with no rubs or gallops.  No JVD or thyromegaly noted. Marland Kitchen Respiratory: Clear to all station with no wheezes or rales.  Good inspiratory effort.   . Abdomen: Soft nontender nondistended with normal bowel sounds x4 quadrants. . Musculoskeletal: Trace lower extremity edema. 2/4 pulses in all 4 extremities. Marland Kitchen Psychiatry: Mood is appropriate for condition and setting   Data Reviewed: CBC: Recent Labs  Lab 07/04/18 2218 07/05/18 0531 07/06/18 0400 07/07/18 0350 07/08/18 0435 07/08/18 0657  WBC 12.7* 11.3* 10.9* 7.4 8.0  --   NEUTROABS 11.0*  --  9.7* 7.1 7.3  --   HGB 7.3* 7.4* 10.7* 11.3* 9.8* 10.0*  HCT 23.6* 24.2* 32.5* 35.1* 30.7*  30.2*  MCV 89.7 91.3 89.5 90.5 89.5  --   PLT 214 234 213 222 197  --    Basic Metabolic Panel: Recent Labs  Lab 07/04/18 1102 07/05/18 0531 07/06/18 0400 07/07/18 0350 07/08/18 0435  NA 137 140 140 139 138  K 3.8 3.7 3.8 4.0 3.9  CL 104 106 106 106 104  CO2 23 21* 22 18* 27  GLUCOSE 164* 128* 155* 209* 194*  BUN 45* 39* 31* 31* 41*  CREATININE 2.46* 2.50* 2.22* 2.27* 2.28*  CALCIUM 8.1* 8.0* 7.8* 8.0* 8.0*   GFR: Estimated Creatinine Clearance (by C-G formula based on SCr of 2.28 mg/dL (H)) Female: 26.8 mL/min (A) Female: 32.7 mL/min (A) Liver Function Tests: Recent Labs  Lab 07/04/18 1102  AST 30  ALT 19  ALKPHOS 33*  BILITOT 1.6*  PROT 5.9*  ALBUMIN 2.6*   No results for  input(s): LIPASE, AMYLASE in the last 168 hours. No results for input(s): AMMONIA in the last 168 hours. Coagulation Profile: No results for input(s): INR, PROTIME in the last 168 hours. Cardiac Enzymes: Recent Labs  Lab 07/04/18 1102 07/04/18 1625 07/04/18 2218 07/05/18 0531  TROPONINI 0.40* 0.50* 0.48* 0.47*   BNP (last 3 results) No results for input(s): PROBNP in the last 8760 hours. HbA1C: Recent Labs    07/06/18 0400  HGBA1C 4.7*   CBG: Recent Labs  Lab 07/07/18 1102 07/07/18 1625 07/07/18 2219 07/08/18 0809 07/08/18 1129  GLUCAP 241* 173* 214* 163* 147*   Lipid Profile: No results for input(s): CHOL, HDL, LDLCALC, TRIG, CHOLHDL, LDLDIRECT in the last 72 hours. Thyroid Function Tests: No results for input(s): TSH, T4TOTAL, FREET4, T3FREE, THYROIDAB in the last 72 hours. Anemia Panel: Recent Labs    07/07/18 1825  FERRITIN 495*  TIBC 179*  IRON 29   Urine analysis: No results found for: COLORURINE, [place], LABSPEC, [place], GLUCOSEU, HGBUR, BILIRUBINUR, KETONESUR, PROTEINUR, UROBILINOGEN, NITRITE, LEUKOCYTESUR Sepsis Labs: @LABRCNTIP (procalcitonin:4,lacticidven:4)  ) Recent Results (from the past 240 hour(s))  MRSA PCR Screening     Status: None   Collection Time: 07/04/18 11:57 PM  Result Value Ref Range Status   MRSA by PCR NEGATIVE NEGATIVE Final    Comment:        The GeneXpert MRSA Assay (FDA approved for NASAL specimens only), is one component of a comprehensive MRSA colonization surveillance program. It is not intended to diagnose MRSA infection nor to guide or monitor treatment for MRSA infections. Performed at [facility], [place] [address] [place], [place] [address]       Studies: No results found.  Scheduled Meds: . carvedilol  25 mg Oral BID  . chlorhexidine  15 mL Mouth Rinse BID  . cloNIDine  0.2 mg Transdermal Q Sun  . dextromethorphan-guaiFENesin  2 tablet Oral BID  . ezetimibe  10 mg Oral Daily  .  furosemide  40 mg Intravenous Daily  . insulin aspart  0-5 Units Subcutaneous QHS  . insulin aspart  0-9 Units Subcutaneous TID WC  . mouth rinse  15 mL Mouth Rinse q12n4p  . multivitamin with minerals  1 tablet Oral Daily  . NIFEdipine  60 mg Oral BID    Continuous Infusions: . ceFEPime (MAXIPIME) IV 1 g (07/07/18 2242)  . pantoprazole (PROTONIX) IV 80 mg (07/08/18 0902)     LOS: 4 days     Kayleen Memos, MD [facility] Pager [phone]  If 7PM-7AM, please contact night-coverage [email or web address] Password [facility] 07/08/2018, 3:33 PM

## 2018-07-08 NOTE — Consult Note (Signed)
Ref: Patient, No Pcp Per   Subjective:  T max 99 degree F. Stable hemoglobin.  Objective:  Vital Signs in the last 24 hours: Temp:  [98.8 F (37.1 C)-99 F (37.2 C)] 99 F (37.2 C) (01/01 0640) Pulse Rate:  [69-77] 77 (01/01 0810) Cardiac Rhythm: Normal sinus rhythm (01/01 0832) Resp:  [14-25] 25 (01/01 0820) BP: (142-173)/(51-66) 145/66 (01/01 0810) SpO2:  [96 %-100 %] 100 % (01/01 0820) Weight:  [102.2 kg] 102.2 kg (01/01 0640)  Physical Exam: BP Readings from Last 1 Encounters:  07/08/18 (!) 145/66     Wt Readings from Last 1 Encounters:  07/08/18 102.2 kg    Weight change: 1.5 kg Body mass index is 37.49 kg/m. HEENT: [place]/AT, Eyes-Brown, PERL, EOMI, Conjunctiva-Pale pink, Sclera-Non-icteric Neck: No JVD, No bruit, Trachea midline. Lungs:  Clear, Bilateral. Cardiac:  Regular rhythm, normal S1 and S2, no S3. II/VI systolic murmur. Abdomen:  Soft, non-tender. BS present. Extremities:  No edema present. No cyanosis. No clubbing. CNS: AxOx3, Cranial nerves grossly intact, moves all 4 extremities.  Skin: Warm and dry.   Intake/Output from previous day: 12/31 0701 - 01/01 0700 In: 1180 [P.O.:960; I.V.:20; IV Piggyback:200] Out: 400 [Urine:400]    Lab Results: BMET    Component Value Date/Time   NA 138 07/08/2018 0435   NA 139 07/07/2018 0350   NA 140 07/06/2018 0400   K 3.9 07/08/2018 0435   K 4.0 07/07/2018 0350   K 3.8 07/06/2018 0400   CL 104 07/08/2018 0435   CL 106 07/07/2018 0350   CL 106 07/06/2018 0400   CO2 27 07/08/2018 0435   CO2 18 (L) 07/07/2018 0350   CO2 22 07/06/2018 0400   GLUCOSE 194 (H) 07/08/2018 0435   GLUCOSE 209 (H) 07/07/2018 0350   GLUCOSE 155 (H) 07/06/2018 0400   BUN 41 (H) 07/08/2018 0435   BUN 31 (H) 07/07/2018 0350   BUN 31 (H) 07/06/2018 0400   CREATININE 2.28 (H) 07/08/2018 0435   CREATININE 2.27 (H) 07/07/2018 0350   CREATININE 2.22 (H) 07/06/2018 0400   CALCIUM 8.0 (L) 07/08/2018 0435   CALCIUM 8.0 (L) 07/07/2018 0350    CALCIUM 7.8 (L) 07/06/2018 0400   GFRNONAA 21 (L) 07/08/2018 0435   GFRNONAA 21 (L) 07/07/2018 0350   GFRNONAA 22 (L) 07/06/2018 0400   GFRAA 24 (L) 07/08/2018 0435   GFRAA 24 (L) 07/07/2018 0350   GFRAA 25 (L) 07/06/2018 0400   CBC    Component Value Date/Time   WBC 8.0 07/08/2018 0435   RBC 3.43 (L) 07/08/2018 0435   HGB 10.0 (L) 07/08/2018 0657   HCT 30.2 (L) 07/08/2018 0657   PLT 197 07/08/2018 0435   MCV 89.5 07/08/2018 0435   MCH 28.6 07/08/2018 0435   MCHC 31.9 07/08/2018 0435   RDW 18.1 (H) 07/08/2018 0435   LYMPHSABS 0.3 (L) 07/08/2018 0435   MONOABS 0.4 07/08/2018 0435   EOSABS 0.0 07/08/2018 0435   BASOSABS 0.0 07/08/2018 0435   HEPATIC Function Panel Recent Labs    07/04/18 1102  PROT 5.9*   HEMOGLOBIN A1C No components found for: HGA1C,  MPG CARDIAC ENZYMES Lab Results  Component Value Date   TROPONINI 0.47 (HH) 07/05/2018   TROPONINI 0.48 (HH) 07/04/2018   TROPONINI 0.50 (HH) 07/04/2018   BNP No results for input(s): PROBNP in the last 8760 hours. TSH No results for input(s): TSH in the last 8760 hours. CHOLESTEROL No results for input(s): CHOL in the last 8760 hours.  Scheduled Meds: .  carvedilol  25 mg Oral BID  . chlorhexidine  15 mL Mouth Rinse BID  . cloNIDine  0.2 mg Transdermal Q Sun  . dextromethorphan-guaiFENesin  2 tablet Oral BID  . ezetimibe  10 mg Oral Daily  . furosemide  40 mg Intravenous Daily  . insulin aspart  0-5 Units Subcutaneous QHS  . insulin aspart  0-9 Units Subcutaneous TID WC  . mouth rinse  15 mL Mouth Rinse q12n4p  . multivitamin with minerals  1 tablet Oral Daily  . NIFEdipine  60 mg Oral BID   Continuous Infusions: . ceFEPime (MAXIPIME) IV 1 g (07/07/18 2242)  . pantoprazole (PROTONIX) IV 80 mg (07/07/18 2337)   PRN Meds:.acetaminophen, albuterol, hydrALAZINE, ipratropium-albuterol, metoprolol tartrate, ondansetron (ZOFRAN) IV  Assessment/Plan: Acute GI bleed Anemia of acute blood loss Abnormal  troponin I from demand ischemia HTN Type 2 DM CKD, IV Morbid obesity Rheumatoid arthritis  Increase activity. F/U in 1 month.   LOS: 4 days    Dixie Dials  MD  07/08/2018, 8:49 AM

## 2018-07-09 LAB — BASIC METABOLIC PANEL
ANION GAP: 10 (ref 5–15)
BUN: 40 mg/dL — ABNORMAL HIGH (ref 8–23)
CO2: 24 mmol/L (ref 22–32)
Calcium: 7.8 mg/dL — ABNORMAL LOW (ref 8.9–10.3)
Chloride: 105 mmol/L (ref 98–111)
Creatinine, Ser: 2.24 mg/dL — ABNORMAL HIGH (ref 0.44–1.00)
GFR calc Af Amer: 25 mL/min — ABNORMAL LOW (ref >60)
GFR calc non Af Amer: 21 mL/min — ABNORMAL LOW (ref >60)
Glucose, Bld: 139 mg/dL — ABNORMAL HIGH (ref 70–99)
POTASSIUM: 3.6 mmol/L (ref 3.5–5.1)
Sodium: 139 mmol/L (ref 135–145)

## 2018-07-09 LAB — CBC WITH DIFFERENTIAL/PLATELET
Abs Immature Granulocytes: 0.02 10*3/uL (ref 0.00–0.07)
Basophils Absolute: 0 10*3/uL (ref 0.0–0.1)
Basophils Relative: 0 %
Eosinophils Absolute: 0.4 10*3/uL (ref 0.0–0.5)
Eosinophils Relative: 7 %
HCT: 32.3 % — ABNORMAL LOW (ref 36.0–46.0)
Hemoglobin: 10 g/dL — ABNORMAL LOW (ref 12.0–15.0)
IMMATURE GRANULOCYTES: 0 %
Lymphocytes Relative: 7 %
Lymphs Abs: 0.4 10*3/uL — ABNORMAL LOW (ref 0.7–4.0)
MCH: 28.1 pg (ref 26.0–34.0)
MCHC: 31 g/dL (ref 30.0–36.0)
MCV: 90.7 fL (ref 80.0–100.0)
Monocytes Absolute: 0.3 10*3/uL (ref 0.1–1.0)
Monocytes Relative: 7 %
NEUTROS PCT: 79 %
Neutro Abs: 4.1 10*3/uL (ref 1.7–7.7)
Platelets: 177 10*3/uL (ref 150–400)
RBC: 3.56 MIL/uL — ABNORMAL LOW (ref 3.87–5.11)
RDW: 17.6 % — ABNORMAL HIGH (ref 11.5–15.5)
WBC: 5.2 10*3/uL (ref 4.0–10.5)
nRBC: 0 % (ref 0.0–0.2)

## 2018-07-09 LAB — GLUCOSE, CAPILLARY
GLUCOSE-CAPILLARY: 121 mg/dL — AB (ref 70–99)
Glucose-Capillary: 168 mg/dL — ABNORMAL HIGH (ref 70–99)
Glucose-Capillary: 124 mg/dL — ABNORMAL HIGH (ref 70–99)
Glucose-Capillary: 134 mg/dL — ABNORMAL HIGH (ref 70–99)

## 2018-07-09 MED ORDER — DILTIAZEM HCL 60 MG PO TABS
90.0000 mg | ORAL_TABLET | Freq: Four times a day (QID) | ORAL | Status: DC
  Administered 2018-07-09 – 2018-07-11 (×7): 90 mg via ORAL
  Filled 2018-07-09 (×7): qty 2

## 2018-07-09 MED ORDER — AMIODARONE HCL 200 MG PO TABS
200.0000 mg | ORAL_TABLET | Freq: Every day | ORAL | Status: DC
  Administered 2018-07-09 – 2018-07-14 (×5): 200 mg via ORAL
  Filled 2018-07-09 (×6): qty 1

## 2018-07-09 NOTE — Progress Notes (Signed)
Patient went into A-Fib this morning around 5Am after having a spell of nausea and bringing up thick, clear sputum, no chest pain as per patient, VS fairly stable except B/P high in the 160-10's/50-60's. Patient was given 10 MG IV Hydralazine for B/P 178/65.EKG obtained and Dr Doylene Canard called with update. Patient was on Elaquis and developed a GIB so at this time we will just monitor since her HR is in the 80's with no symptoms, no orders received at this time.

## 2018-07-09 NOTE — Progress Notes (Signed)
PROGRESS NOTE  Jade Mclean HQI:[id number] DOB: [birth year] DOA: 07/04/2018 PCP: Patient, No Pcp Per  HPI/Recap of past 24 hours:  Jade Mclean is a 73 y.o. female with past medical history significant for recently diagnosed pulmonary embolism on Eliquis and unspecified CHF, CKD 4, type 2 diabetes, hypertension, who presented from home last night via EMS to [facility] ED due to extreme fatigue and somnolence as noted by family members.  She was transported to [facility] ED where she was found to have a hemoglobin of 5 and a positive FOBT.  No GI services at [facility] at that time therefore the patient was transferred to [facility] for possible GI evaluation.  Patient received 2 units of PRBCs.  Repeat H&H 7.4. Pt still symptomatic.additional 2 unit PRBCs to be transfused today.  07/05/2018: Patient seen and examined at her bedside.  She is alert and oriented x3 however she is still very somnolent prior to the 2 additional units of PRBCs to be transfused.  EGD completed by Dr. Collene Mares with findings of multiple large superficial duodenal ulcers.  Eliquis has been on hold for the last 2 days.  Interventional radiology has been consulted for possible IVC filter placement ASAP.  Discussed with interventional radiology who reviewed the VQ scan reported to be negative.  Initial positive results were due to artifacts.  07/06/2018: Patient seen and examined at bedside.  Rapid response called overnight due to respiratory distress after blood transfusion.  Chest x-ray done independently reviewed which revealed mild increase in pulmonary vascularity suggestive of mild pulmonary edema.  Given 1 dose of IV Lasix 40 mg.  We will give another dose of 40 mg of IV Lasix.  ABG done which was unremarkable.  Patient placed on 3 L of oxygen by nasal cannula saturating 100%.  07/07/2018: Patient seen and examined at bedside.  Yesterday had a diffuse rash with unclear etiology and was treated for allergic reaction.  Rash  is resolving.  States she feels much better today.  07/08/2018: Patient seen and examined at her bedside.  She states she is feeling better breathing better also.  Rash has resolved.  She has no new complaints.  Hemoglobin dropped this morning.  Reports constipation, no bowel movements for at least 3 days.  Start bowel regimen.  07/09/2018: Patient seen and examined with her niece at bedside.  She is somnolent but easily arousable to voices.  She denies any pain.  She has no new complaints.  Patient went into A. fib overnight which was confirmed with twelve-lead EKG.  Cardiology has been made aware.  Started amiodarone 200 mg daily and nifedipine switched to Cardizem immediate release 60 mg every 6 hours.  Assessment/Plan: Active Problems:   GI bleeding   Acute blood loss anemia  Acute upper GI bleed secondary to multiple large superficial duodenal ulcers Findings from EGD done by GI Dr. Collene Mares on 07/05/2018 Continue Protonix IV 80 mg twice daily Discontinue Eliquis due to negative VQ scan No evidence of PE or prior PE. Currently on clear liquid diet as recommended by GI Defer to GI to advance diet Received 4 units of PRBCs Hemoglobin stable at 10.0 from 10.0 from 11.3 No sign of overt bleeding at this time  New A. fib with moderate ventricular response Patient denies any previous history of A. Fib Cardiology following Continue amiodarone 200 mg daily and diltiazem immediate release 60 mg every 6 hours as recommended by cardiology Continue to hold Eliquis due to recent GI bleed Continue to closely  monitor on telemetry  Acute on chronic blood loss anemia/iron deficiency anemia suspect secondary to multiple large superficial duodenal ulcers erosions Management as stated above Iron deficiency anemia Continue ferrous sulfate 325 mg daily H&H are stable No sign of overt bleeding  Resolving chronic constipation Continue bowel regimen with 2 tablets of Senokot twice daily and MiraLAX  daily  Resolving diffuse rash of unclear etiology Treated on 07/06/2018 for allergic reaction with IV Solu-Medrol and IV Benadryl Rash is resolving Continue to closely monitor  Improving acute hypoxic respiratory failure suspect secondary to acute pulmonary edema from blood transfusion Received IV Lasix total of 80 mg today O2 saturation 100% on 3 L currently Wean off oxygen as tolerated  Bilateral lower extremity tenderness Bilateral lower extremity duplex ultrasound pending  Elevated troponin Trending down Peaked at 0.50 on 07/04/2018 Cardiology consulted and cleared for endoscopy which was done on 07/05/2018  Suspected HCAP, present on admission Continue IV cefepime Procalcitonin trending down Monitor for fever WBCs trending down  Recently diagnosed unspecified CHF BNP elevated greater than 17,000 We will need to obtain last 2D echo records from [facility] Cardiology consult for clearance due to possible endoscopy Continue strict I's and O's and daily weight  Recently diagnosed acute PE; ruled out VQ scan reviewed by interventional radiology and reported to be low probability for PE Prior positive results were due to artifacts  Uncontrolled Hypertension On clonidine 0.2 mg twice daily Resume home dose Coreg 25 mg twice daily and nifedipine Monitor vital signs closely Hydralazine IV 10 mg every 6 hours PRN for systolic blood pressure greater than 785 or diastolic greater than 885 Also on Lasix IV 40 mg daily  Type 2 diabetes Hemoglobin A1c 4.7 on 07/06/2018 Avoid hypoglycemia  CKD 4 Unclear of her baseline Presented with creatinine 2.30 on 07/04/2018 Creatinine today 2.24 from 2.28 from 2.27 Continue to monitor urine output; not recorded Avoid nephrotoxic agents Repeat BMP in the morning  Ambulatory dysfunction with falls/physical debility Uses a walker and cane at baseline PT assessed and recommended SNF Patient is agreeable to SNF Social worker  consulted for placement  Rheumatoid arthritis Appears stable Denies use of NSAIDs On Enbrel qFriday    Risks: High risk for decompensation due to severe symptomatic anemia with hemoglobin of 5, acute GI bleed secondary to multiple superficial duodenal ulcers, elevated troponin, multiple comorbidities and advanced age.  DVT prophylaxis: SCDs.  VTE pharmacological prophylaxis contraindicated in the setting of suspected upper GI bleed.  Code Status: DNR  Family Communication:  Updated niece at bedside.  All questions answered to her satisfaction.  Disposition Plan:  Discharge to SNF possibly tomorrow 07-10-18  Consults called: GI, Dr. Collene Mares; cardiology        Objective: Vitals:   07/09/18 0835 07/09/18 1014 07/09/18 1213 07/09/18 1300  BP: 138/74 (!) 164/62 (!) 149/110   Pulse: 85 (!) 46 87 67  Resp: 18 (!) 35 20 20  Temp:    98.6 F (37 C)  TempSrc:    Oral  SpO2: 100% 98% 96% 97%  Weight:      Height:        Intake/Output Summary (Last 24 hours) at 07/09/2018 1410 Last data filed at 07/09/2018 0824 Gross per 24 hour  Intake 500 ml  Output -  Net 500 ml   Filed Weights   07/04/18 2106 07/07/18 0452 07/08/18 0640  Weight: 102.1 kg 100.7 kg 102.2 kg    Exam:  . General: 73 y.o. year-old adult obese in no acute  distress.  Somnolent but easily arousable to voices.  Cardiovascular: Irregular rate and rhythm with no rubs or gallops.  No JVD or thyromegaly noted.  Respiratory: Clear to auscultation with no wheezes or rales.  Good inspiratory effort. . Abdomen: Soft nontender nondistended with normal bowel sounds x4 quadrants. . Musculoskeletal: Trace lower extremity edema. 2/4 pulses in all 4 extremities. Marland Kitchen Psychiatry: Mood is appropriate for condition and setting   Data Reviewed: CBC: Recent Labs  Lab 07/04/18 2218 07/05/18 0531 07/06/18 0400 07/07/18 0350 07/08/18 0435 07/08/18 0657 07/09/18 0319  WBC 12.7* 11.3* 10.9* 7.4 8.0  --  5.2  NEUTROABS  11.0*  --  9.7* 7.1 7.3  --  4.1  HGB 7.3* 7.4* 10.7* 11.3* 9.8* 10.0* 10.0*  HCT 23.6* 24.2* 32.5* 35.1* 30.7* 30.2* 32.3*  MCV 89.7 91.3 89.5 90.5 89.5  --  90.7  PLT 214 234 213 222 197  --  782   Basic Metabolic Panel: Recent Labs  Lab 07/05/18 0531 07/06/18 0400 07/07/18 0350 07/08/18 0435 07/09/18 0319  NA 140 140 139 138 139  K 3.7 3.8 4.0 3.9 3.6  CL 106 106 106 104 105  CO2 21* 22 18* 27 24  GLUCOSE 128* 155* 209* 194* 139*  BUN 39* 31* 31* 41* 40*  CREATININE 2.50* 2.22* 2.27* 2.28* 2.24*  CALCIUM 8.0* 7.8* 8.0* 8.0* 7.8*   GFR: Estimated Creatinine Clearance (by C-G formula based on SCr of 2.24 mg/dL (H)) Female: 27.3 mL/min (A) Female: 33.3 mL/min (A) Liver Function Tests: Recent Labs  Lab 07/04/18 1102  AST 30  ALT 19  ALKPHOS 33*  BILITOT 1.6*  PROT 5.9*  ALBUMIN 2.6*   No results for input(s): LIPASE, AMYLASE in the last 168 hours. No results for input(s): AMMONIA in the last 168 hours. Coagulation Profile: No results for input(s): INR, PROTIME in the last 168 hours. Cardiac Enzymes: Recent Labs  Lab 07/04/18 1102 07/04/18 1625 07/04/18 2218 07/05/18 0531  TROPONINI 0.40* 0.50* 0.48* 0.47*   BNP (last 3 results) No results for input(s): PROBNP in the last 8760 hours. HbA1C: No results for input(s): HGBA1C in the last 72 hours. CBG: Recent Labs  Lab 07/08/18 1129 07/08/18 1650 07/08/18 2304 07/09/18 0726 07/09/18 1209  GLUCAP 147* 117* 113* 168* 124*   Lipid Profile: No results for input(s): CHOL, HDL, LDLCALC, TRIG, CHOLHDL, LDLDIRECT in the last 72 hours. Thyroid Function Tests: No results for input(s): TSH, T4TOTAL, FREET4, T3FREE, THYROIDAB in the last 72 hours. Anemia Panel: Recent Labs    07/07/18 1825  FERRITIN 495*  TIBC 179*  IRON 29   Urine analysis: No results found for: COLORURINE, [place], LABSPEC, [place], GLUCOSEU, HGBUR, BILIRUBINUR, KETONESUR, PROTEINUR, UROBILINOGEN, NITRITE, LEUKOCYTESUR Sepsis  Labs: @LABRCNTIP (procalcitonin:4,lacticidven:4)  ) Recent Results (from the past 240 hour(s))  MRSA PCR Screening     Status: None   Collection Time: 07/04/18 11:57 PM  Result Value Ref Range Status   MRSA by PCR NEGATIVE NEGATIVE Final    Comment:        The GeneXpert MRSA Assay (FDA approved for NASAL specimens only), is one component of a comprehensive MRSA colonization surveillance program. It is not intended to diagnose MRSA infection nor to guide or monitor treatment for MRSA infections. Performed at [facility], [place] [address], [place], [place] [address]       Studies: No results found.  Scheduled Meds: . amiodarone  200 mg Oral Daily  . carvedilol  25 mg Oral BID  . chlorhexidine  15 mL Mouth Rinse BID  . cloNIDine  0.2 mg Transdermal Q Sun  . dextromethorphan-guaiFENesin  2 tablet Oral BID  . diltiazem  90 mg Oral Q6H  . ezetimibe  10 mg Oral Daily  . furosemide  40 mg Intravenous Daily  . insulin aspart  0-5 Units Subcutaneous QHS  . insulin aspart  0-9 Units Subcutaneous TID WC  . mouth rinse  15 mL Mouth Rinse q12n4p  . multivitamin with minerals  1 tablet Oral Daily  . NIFEdipine  60 mg Oral BID  . polyethylene glycol  17 g Oral Daily  . senna-docusate  2 tablet Oral BID    Continuous Infusions: . ceFEPime (MAXIPIME) IV 1 g (07/08/18 2222)  . pantoprazole (PROTONIX) IV 80 mg (07/09/18 1019)     LOS: 5 days     Kayleen Memos, MD [facility] Pager [phone]  If 7PM-7AM, please contact night-coverage [email or web address] Password [email or web address] 07/09/2018, 2:10 PM

## 2018-07-09 NOTE — Progress Notes (Signed)
Patient converted from Afib to NSR. Obtained 12 lead EKG and placed in patient's chart. MD notified. Will continue to monitor.

## 2018-07-09 NOTE — Consult Note (Signed)
Ref: Patient, No Pcp Per   Subjective:  No new complaints. Went into atrial Fib few hours ago. Heart rate upto 120's at times.  Objective:  Vital Signs in the last 24 hours: Temp:  [98.3 F (36.8 C)-98.8 F (37.1 C)] 98.3 F (36.8 C) (01/02 0417) Pulse Rate:  [70-87] 87 (01/02 0740) Cardiac Rhythm: Atrial fibrillation (01/02 0824) Resp:  [21-33] 21 (01/02 0740) BP: (112-174)/(50-69) 112/66 (01/02 0740) SpO2:  [97 %-100 %] 100 % (01/02 0740)  Physical Exam: BP Readings from Last 1 Encounters:  07/09/18 112/66     Wt Readings from Last 1 Encounters:  07/08/18 102.2 kg    Weight change:  Body mass index is 37.49 kg/m. HEENT: [place]/AT, Eyes-Brown, PERL, EOMI, Conjunctiva-Pale pink, Sclera-Non-icteric Neck: No JVD, No bruit, Trachea midline. Lungs:  Clear, Bilateral. Cardiac:  Irregular rhythm, normal S1 and S2, no S3. II/VI systolic murmur. Abdomen:  Soft, non-tender. BS present. Extremities:  No edema present. No cyanosis. No clubbing. CNS: AxOx3, Cranial nerves grossly intact, moves all 4 extremities.  Skin: Warm and dry.   Intake/Output from previous day: 01/01 0701 - 01/02 0700 In: 440 [P.O.:240; IV Piggyback:200] Out: -     Lab Results: BMET    Component Value Date/Time   NA 139 07/09/2018 0319   NA 138 07/08/2018 0435   NA 139 07/07/2018 0350   K 3.6 07/09/2018 0319   K 3.9 07/08/2018 0435   K 4.0 07/07/2018 0350   CL 105 07/09/2018 0319   CL 104 07/08/2018 0435   CL 106 07/07/2018 0350   CO2 24 07/09/2018 0319   CO2 27 07/08/2018 0435   CO2 18 (L) 07/07/2018 0350   GLUCOSE 139 (H) 07/09/2018 0319   GLUCOSE 194 (H) 07/08/2018 0435   GLUCOSE 209 (H) 07/07/2018 0350   BUN 40 (H) 07/09/2018 0319   BUN 41 (H) 07/08/2018 0435   BUN 31 (H) 07/07/2018 0350   CREATININE 2.24 (H) 07/09/2018 0319   CREATININE 2.28 (H) 07/08/2018 0435   CREATININE 2.27 (H) 07/07/2018 0350   CALCIUM 7.8 (L) 07/09/2018 0319   CALCIUM 8.0 (L) 07/08/2018 0435   CALCIUM 8.0 (L)  07/07/2018 0350   GFRNONAA 21 (L) 07/09/2018 0319   GFRNONAA 21 (L) 07/08/2018 0435   GFRNONAA 21 (L) 07/07/2018 0350   GFRAA 25 (L) 07/09/2018 0319   GFRAA 24 (L) 07/08/2018 0435   GFRAA 24 (L) 07/07/2018 0350   CBC    Component Value Date/Time   WBC 5.2 07/09/2018 0319   RBC 3.56 (L) 07/09/2018 0319   HGB 10.0 (L) 07/09/2018 0319   HCT 32.3 (L) 07/09/2018 0319   PLT 177 07/09/2018 0319   MCV 90.7 07/09/2018 0319   MCH 28.1 07/09/2018 0319   MCHC 31.0 07/09/2018 0319   RDW 17.6 (H) 07/09/2018 0319   LYMPHSABS 0.4 (L) 07/09/2018 0319   MONOABS 0.3 07/09/2018 0319   EOSABS 0.4 07/09/2018 0319   BASOSABS 0.0 07/09/2018 0319   HEPATIC Function Panel Recent Labs    07/04/18 1102  PROT 5.9*   HEMOGLOBIN A1C No components found for: HGA1C,  MPG CARDIAC ENZYMES Lab Results  Component Value Date   TROPONINI 0.47 (HH) 07/05/2018   TROPONINI 0.48 (HH) 07/04/2018   TROPONINI 0.50 (HH) 07/04/2018   BNP No results for input(s): PROBNP in the last 8760 hours. TSH No results for input(s): TSH in the last 8760 hours. CHOLESTEROL No results for input(s): CHOL in the last 8760 hours.  Scheduled Meds: . amiodarone  200  mg Oral Daily  . carvedilol  25 mg Oral BID  . chlorhexidine  15 mL Mouth Rinse BID  . cloNIDine  0.2 mg Transdermal Q Sun  . dextromethorphan-guaiFENesin  2 tablet Oral BID  . diltiazem  90 mg Oral Q6H  . ezetimibe  10 mg Oral Daily  . furosemide  40 mg Intravenous Daily  . insulin aspart  0-5 Units Subcutaneous QHS  . insulin aspart  0-9 Units Subcutaneous TID WC  . mouth rinse  15 mL Mouth Rinse q12n4p  . multivitamin with minerals  1 tablet Oral Daily  . NIFEdipine  60 mg Oral BID  . polyethylene glycol  17 g Oral Daily  . senna-docusate  2 tablet Oral BID   Continuous Infusions: . ceFEPime (MAXIPIME) IV 1 g (07/08/18 2222)  . pantoprazole (PROTONIX) IV 80 mg (07/08/18 2311)   PRN Meds:.acetaminophen, albuterol, hydrALAZINE, ipratropium-albuterol,  ondansetron (ZOFRAN) IV  Assessment/Plan: Atrial fibrillation with moderate ventricular response Acute GI bleed Anemia of acute blood loss Abnormal troponin I from demand ischemia HTN Type 2 DM CKD, IV Morbid obesity Rheumatoid arthritis  Add amiodarone for rate control. Change Nifedipine to diltiazem. Continue Coreg. Continue holding Eliquis.   LOS: 5 days    Dixie Dials  MD  07/09/2018, 8:35 AM

## 2018-07-10 LAB — CBC WITH DIFFERENTIAL/PLATELET
ABS IMMATURE GRANULOCYTES: 0.03 10*3/uL (ref 0.00–0.07)
Basophils Absolute: 0 10*3/uL (ref 0.0–0.1)
Basophils Relative: 0 %
Eosinophils Absolute: 0.3 10*3/uL (ref 0.0–0.5)
Eosinophils Relative: 7 %
HCT: 31.3 % — ABNORMAL LOW (ref 36.0–46.0)
Hemoglobin: 10.1 g/dL — ABNORMAL LOW (ref 12.0–15.0)
Immature Granulocytes: 1 %
Lymphocytes Relative: 9 %
Lymphs Abs: 0.4 10*3/uL — ABNORMAL LOW (ref 0.7–4.0)
MCH: 29.2 pg (ref 26.0–34.0)
MCHC: 32.3 g/dL (ref 30.0–36.0)
MCV: 90.5 fL (ref 80.0–100.0)
Monocytes Absolute: 0.4 10*3/uL (ref 0.1–1.0)
Monocytes Relative: 9 %
Neutro Abs: 3.3 10*3/uL (ref 1.7–7.7)
Neutrophils Relative %: 74 %
Platelets: 158 10*3/uL (ref 150–400)
RBC: 3.46 MIL/uL — ABNORMAL LOW (ref 3.87–5.11)
RDW: 17.2 % — ABNORMAL HIGH (ref 11.5–15.5)
WBC: 4.5 10*3/uL (ref 4.0–10.5)
nRBC: 0 % (ref 0.0–0.2)

## 2018-07-10 LAB — GLUCOSE, CAPILLARY
Glucose-Capillary: 165 mg/dL — ABNORMAL HIGH (ref 70–99)
Glucose-Capillary: 171 mg/dL — ABNORMAL HIGH (ref 70–99)
Glucose-Capillary: 121 mg/dL — ABNORMAL HIGH (ref 70–99)
Glucose-Capillary: 146 mg/dL — ABNORMAL HIGH (ref 70–99)

## 2018-07-10 LAB — BASIC METABOLIC PANEL
Anion gap: 12 (ref 5–15)
BUN: 44 mg/dL — ABNORMAL HIGH (ref 8–23)
CO2: 21 mmol/L — ABNORMAL LOW (ref 22–32)
Calcium: 8 mg/dL — ABNORMAL LOW (ref 8.9–10.3)
Chloride: 103 mmol/L (ref 98–111)
Creatinine, Ser: 2.65 mg/dL — ABNORMAL HIGH (ref 0.44–1.00)
GFR calc Af Amer: 20 mL/min — ABNORMAL LOW (ref >60)
GFR calc non Af Amer: 17 mL/min — ABNORMAL LOW (ref >60)
Glucose, Bld: 182 mg/dL — ABNORMAL HIGH (ref 70–99)
Potassium: 4.2 mmol/L (ref 3.5–5.1)
SODIUM: 136 mmol/L (ref 135–145)

## 2018-07-10 MED ORDER — ETANERCEPT 25 MG ~~LOC~~ SOLR
25.0000 mg | SUBCUTANEOUS | Status: DC
  Administered 2018-07-10: 25 mg via SUBCUTANEOUS

## 2018-07-10 NOTE — Social Work (Signed)
Continuing to await [facility] authorization for [employer]. CSW to follow.  Estanislado Emms, LCSW [phone]

## 2018-07-10 NOTE — Progress Notes (Signed)
PROGRESS NOTE  Jade Mclean BDZ:[id number] DOB: 1945/10/28 DOA: 07/04/2018 PCP: Patient, No Pcp Per  HPI/Recap of past 24 hours:  Jade Mclean is a 73 y.o. female with past medical history significant for recently diagnosed pulmonary embolism on Eliquis and unspecified CHF, CKD 4, type 2 diabetes, hypertension, who presented from home last night via EMS to [facility] ED due to extreme fatigue and somnolence as noted by family members.  She was transported to [facility] ED where she was found to have a hemoglobin of 5 and a positive FOBT.  No GI services at [facility] at that time therefore the patient was transferred to [facility] for possible GI evaluation.  Patient received 2 units of PRBCs.  Repeat H&H 7.4. Pt still symptomatic.additional 2 unit PRBCs to be transfused today.  07/05/2018: Patient seen and examined at her bedside.  She is alert and oriented x3 however she is still very somnolent prior to the 2 additional units of PRBCs to be transfused.  EGD completed by Dr. Collene Mares with findings of multiple large superficial duodenal ulcers.  Eliquis has been on hold for the last 2 days.  Interventional radiology has been consulted for possible IVC filter placement ASAP.  Discussed with interventional radiology who reviewed the VQ scan reported to be negative.  Initial positive results were due to artifacts.  07/06/2018: Rapid response called overnight due to respiratory distress after blood transfusion.  Chest x-ray done independently reviewed which revealed mild increase in pulmonary vascularity suggestive of mild pulmonary edema.  Given 1 dose of IV Lasix 40 mg.  We will give another dose of 40 mg of IV Lasix.  ABG done which was unremarkable.  Patient placed on 3 L of oxygen by nasal cannula saturating 100%.  07/07/2018: Yesterday had a diffuse rash with unclear etiology and was treated for allergic reaction.  Rash is resolving.  States she feels much better today.  07/08/2018: Rash has  resolved.  She has no new complaints.  Hemoglobin dropped this morning.  Reports constipation, no bowel movements for at least 3 days.  Start bowel regimen.  07/09/2018: She is somnolent but easily arousable to voices.  She denies any pain.  She has no new complaints.  Patient went into A. fib overnight which was confirmed with twelve-lead EKG.  Cardiology has been made aware.  Started amiodarone 200 mg daily and nifedipine switched to Cardizem immediate release 60 mg every 6 hours.  07/10/2018: Patient seen and examined at bedside.  No acute events overnight.  No new complaints.  Worsening renal function this a.m.  On IV Lasix.    Assessment/Plan: Active Problems:   GI bleeding   Acute blood loss anemia  Acute upper GI bleed secondary to multiple large superficial duodenal ulcers Findings from EGD done by GI Dr. Collene Mares on 07/05/2018 Continue Protonix IV 80 mg twice daily Discontinue Eliquis due to negative VQ scan No evidence of PE or prior PE. Currently on clear liquid diet as recommended by GI Defer to GI to advance diet Received 4 units of PRBCs Hemoglobin stable at 10.0 from 10.0 from 11.3 No sign of overt bleeding at this time  New A. fib with moderate ventricular response Patient denies any previous history of A. Fib Cardiology following; Chads Vasc score 4 Continue amiodarone 200 mg daily and diltiazem immediate release 60 mg every 6 hours as recommended by cardiology Continue to hold Eliquis due to recent GI bleed Continue to closely monitor on telemetry  AKI on CKD 3 Creatinine  today 2.65 from 2.24 On IV Lasix per cardiology Net +1.8 L since admission Monitor U/O  Avoid nephrotoxic agents Strict I's and O's have not been recorded/documented  Non-anion gap metabolic acidosis suspect secondary to renal failure Bicarb 21 with worsening creatinine  Suspected HCAP, present on admission Completed 5 days of  IV cefepime Dc cefepime on 07/10/18  Acute on chronic blood loss  anemia/iron deficiency anemia suspect secondary to multiple large superficial duodenal ulcers erosions Management as stated above Iron deficiency anemia Continue ferrous sulfate 325 mg daily Hemoglobin stable No sign of overt bleeding  Resolving chronic constipation Continue bowel regimen with 2 tablets of Senokot twice daily and MiraLAX daily  Resolving diffuse rash of unclear etiology Treated on 07/06/2018 for allergic reaction with IV Solu-Medrol and IV Benadryl Rash is resolving Continue to closely monitor  Improving acute hypoxic respiratory failure suspect secondary to acute pulmonary edema from blood transfusion Received IV Lasix total of 80 mg today O2 saturation 100% on 3 L currently Wean off oxygen as tolerated  Bilateral lower extremity tenderness Bilateral lower extremity duplex ultrasound pending  Elevated troponin Trending down Peaked at 0.50 on 07/04/2018 Cardiology consulted and cleared for endoscopy which was done on 07/05/2018  Recently diagnosed unspecified CHF BNP elevated greater than 17,000 We will need to obtain last 2D echo records from [facility] consult for clearance due to possible endoscopy Continue strict I's and O's and daily weight  Recently diagnosed acute PE; ruled out VQ scan reviewed by interventional radiology and reported to be low probability for PE Prior positive results were due to artifacts  Uncontrolled Hypertension On clonidine 0.2 mg twice daily Resume home dose Coreg 25 mg twice daily and nifedipine Monitor vital signs closely Hydralazine IV 10 mg every 6 hours PRN for systolic blood pressure greater than 623 or diastolic greater than 762 Also on Lasix IV 40 mg daily  Type 2 diabetes Hemoglobin A1c 4.7 on 07/06/2018 Avoid hypoglycemia  CKD 4 Unclear of her baseline Presented with creatinine 2.30 on 07/04/2018 Creatinine today 2.24 from 2.28 from 2.27 Continue to monitor urine output; not recorded Avoid  nephrotoxic agents Repeat BMP in the morning  Ambulatory dysfunction with falls/physical debility Uses a walker and cane at baseline PT assessed and recommended SNF Patient is agreeable to SNF Social worker consulted for placement OOB to chair as tolerated  Rheumatoid arthritis Appears stable Denies use of NSAIDs On Enbrel qFriday    Risks: High risk for decompensation due to severe symptomatic anemia with hemoglobin of 5, acute GI bleed secondary to multiple superficial duodenal ulcers, elevated troponin, multiple comorbidities and advanced age.  DVT prophylaxis: SCDs.  VTE pharmacological prophylaxis contraindicated in the setting of suspected upper GI bleed.  Code Status: DNR  Family Communication:  Updated niece at bedside.  All questions answered to her satisfaction.  Disposition Plan:  Discharge to SNF possibly tomorrow 07-10-18  Consults called: GI, Dr. Collene Mares; cardiology        Objective: Vitals:   07/10/18 0853 07/10/18 1150 07/10/18 1549 07/10/18 1645  BP: (!) 143/46 (!) 142/53 (!) 132/52   Pulse:      Resp:      Temp: 98 F (36.7 C)   98.8 F (37.1 C)  TempSrc: Oral   Oral  SpO2: 96%   98%  Weight:      Height:       No intake or output data in the 24 hours ending 07/10/18 [place]   07/07/18 0452 07/08/18 8315  07/10/18 0429  Weight: 100.7 kg 102.2 kg 99.9 kg    Exam:  . General: 73 y.o. year-old adult Obese in NAD A&O x 3 .  Cardiovascular:RRR no rubs or gallops No JVD or thyromegaly .  Respiratory: CTA with no rubs or gallops No JVD or thyromegaly . Abdomen: Soft nontender nondistended with normal bowel sounds x4 quadrants. . Musculoskeletal: No LE edema. 2/4 pulses in all 4 extremities. Marland Kitchen Psychiatry: Mood is appropriate for condition and setting   Data Reviewed: CBC: Recent Labs  Lab 07/06/18 0400 07/07/18 0350 07/08/18 0435 07/08/18 0657 07/09/18 0319 07/10/18 0443  WBC 10.9* 7.4 8.0  --  5.2 4.5  NEUTROABS  9.7* 7.1 7.3  --  4.1 3.3  HGB 10.7* 11.3* 9.8* 10.0* 10.0* 10.1*  HCT 32.5* 35.1* 30.7* 30.2* 32.3* 31.3*  MCV 89.5 90.5 89.5  --  90.7 90.5  PLT 213 222 197  --  177 324   Basic Metabolic Panel: Recent Labs  Lab 07/06/18 0400 07/07/18 0350 07/08/18 0435 07/09/18 0319 07/10/18 0443  NA 140 139 138 139 136  K 3.8 4.0 3.9 3.6 4.2  CL 106 106 104 105 103  CO2 22 18* 27 24 21*  GLUCOSE 155* 209* 194* 139* 182*  BUN 31* 31* 41* 40* 44*  CREATININE 2.22* 2.27* 2.28* 2.24* 2.65*  CALCIUM 7.8* 8.0* 8.0* 7.8* 8.0*   GFR: Estimated Creatinine Clearance (by C-G formula based on SCr of 2.65 mg/dL (H)) Female: 22.8 mL/min (A) Female: 27.8 mL/min (A) Liver Function Tests: Recent Labs  Lab 07/04/18 1102  AST 30  ALT 19  ALKPHOS 33*  BILITOT 1.6*  PROT 5.9*  ALBUMIN 2.6*   No results for input(s): LIPASE, AMYLASE in the last 168 hours. No results for input(s): AMMONIA in the last 168 hours. Coagulation Profile: No results for input(s): INR, PROTIME in the last 168 hours. Cardiac Enzymes: Recent Labs  Lab 07/04/18 1102 07/04/18 1625 07/04/18 2218 07/05/18 0531  TROPONINI 0.40* 0.50* 0.48* 0.47*   BNP (last 3 results) No results for input(s): PROBNP in the last 8760 hours. HbA1C: No results for input(s): HGBA1C in the last 72 hours. CBG: Recent Labs  Lab 07/09/18 1614 07/09/18 2143 07/10/18 0731 07/10/18 1121 07/10/18 1642  GLUCAP 134* 121* 165* 171* 121*   Lipid Profile: No results for input(s): CHOL, HDL, LDLCALC, TRIG, CHOLHDL, LDLDIRECT in the last 72 hours. Thyroid Function Tests: No results for input(s): TSH, T4TOTAL, FREET4, T3FREE, THYROIDAB in the last 72 hours. Anemia Panel: Recent Labs    07/07/18 1825  FERRITIN 495*  TIBC 179*  IRON 29   Urine analysis: No results found for: COLORURINE, [place], LABSPEC, [place], GLUCOSEU, HGBUR, BILIRUBINUR, KETONESUR, PROTEINUR, UROBILINOGEN, NITRITE, LEUKOCYTESUR Sepsis  Labs: @LABRCNTIP (procalcitonin:4,lacticidven:4)  ) Recent Results (from the past 240 hour(s))  MRSA PCR Screening     Status: None   Collection Time: 07/04/18 11:57 PM  Result Value Ref Range Status   MRSA by PCR NEGATIVE NEGATIVE Final    Comment:        The GeneXpert MRSA Assay (FDA approved for NASAL specimens only), is one component of a comprehensive MRSA colonization surveillance program. It is not intended to diagnose MRSA infection nor to guide or monitor treatment for MRSA infections. Performed at [facility], [place] [address] [place], [place] [address]       Studies: No results found.  Scheduled Meds: . amiodarone  200 mg Oral Daily  . carvedilol  25 mg Oral BID  .  chlorhexidine  15 mL Mouth Rinse BID  . cloNIDine  0.2 mg Transdermal Q Sun  . dextromethorphan-guaiFENesin  2 tablet Oral BID  . diltiazem  90 mg Oral Q6H  . etanercept  25 mg Subcutaneous Q Fri  . ezetimibe  10 mg Oral Daily  . furosemide  40 mg Intravenous Daily  . insulin aspart  0-5 Units Subcutaneous QHS  . insulin aspart  0-9 Units Subcutaneous TID WC  . mouth rinse  15 mL Mouth Rinse q12n4p  . multivitamin with minerals  1 tablet Oral Daily  . polyethylene glycol  17 g Oral Daily  . senna-docusate  2 tablet Oral BID    Continuous Infusions: . pantoprazole (PROTONIX) IV 80 mg (07/10/18 0941)     LOS: 6 days     Kayleen Memos, MD [facility] Pager [phone]  If 7PM-7AM, please contact night-coverage [email or web address] Password [email or web address] 07/10/2018, 5:14 PM

## 2018-07-10 NOTE — Consult Note (Signed)
Ref: Patient, No Pcp Per   Subjective:  Heart rate controlled. Back in sinus rhythm.  Objective:  Vital Signs in the last 24 hours: Temp:  [97.6 F (36.4 C)-98.6 F (37 C)] 98 F (36.7 C) (01/03 0853) Pulse Rate:  [46-87] 51 (01/03 0429) Cardiac Rhythm: Sinus bradycardia (01/03 0700) Resp:  [16-35] 16 (01/03 0244) BP: (112-164)/(43-110) 143/46 (01/03 0853) SpO2:  [92 %-99 %] 96 % (01/03 0853) Weight:  [99.9 kg] 99.9 kg (01/03 0429)  Physical Exam: BP Readings from Last 1 Encounters:  07/10/18 (!) 143/46     Wt Readings from Last 1 Encounters:  07/10/18 99.9 kg    Weight change:  Body mass index is 36.65 kg/m. HEENT: [place]/AT, Eyes-Brown, PERL, EOMI, Conjunctiva-Pink, Sclera-Non-icteric Neck: No JVD, No bruit, Trachea midline. Lungs:  Clear, Bilateral. Cardiac:  Regular rhythm, normal S1 and S2, no S3. II/VI systolic murmur. Abdomen:  Soft, non-tender. BS present. Extremities:  No edema present. No cyanosis. No clubbing. CNS: AxOx3, Cranial nerves grossly intact, moves all 4 extremities.  Skin: Warm and dry.   Intake/Output from previous day: 01/02 0701 - 01/03 0700 In: 120 [P.O.:120] Out: -     Lab Results: BMET    Component Value Date/Time   NA 136 07/10/2018 0443   NA 139 07/09/2018 0319   NA 138 07/08/2018 0435   K 4.2 07/10/2018 0443   K 3.6 07/09/2018 0319   K 3.9 07/08/2018 0435   CL 103 07/10/2018 0443   CL 105 07/09/2018 0319   CL 104 07/08/2018 0435   CO2 21 (L) 07/10/2018 0443   CO2 24 07/09/2018 0319   CO2 27 07/08/2018 0435   GLUCOSE 182 (H) 07/10/2018 0443   GLUCOSE 139 (H) 07/09/2018 0319   GLUCOSE 194 (H) 07/08/2018 0435   BUN 44 (H) 07/10/2018 0443   BUN 40 (H) 07/09/2018 0319   BUN 41 (H) 07/08/2018 0435   CREATININE 2.65 (H) 07/10/2018 0443   CREATININE 2.24 (H) 07/09/2018 0319   CREATININE 2.28 (H) 07/08/2018 0435   CALCIUM 8.0 (L) 07/10/2018 0443   CALCIUM 7.8 (L) 07/09/2018 0319   CALCIUM 8.0 (L) 07/08/2018 0435   GFRNONAA 17  (L) 07/10/2018 0443   GFRNONAA 21 (L) 07/09/2018 0319   GFRNONAA 21 (L) 07/08/2018 0435   GFRAA 20 (L) 07/10/2018 0443   GFRAA 25 (L) 07/09/2018 0319   GFRAA 24 (L) 07/08/2018 0435   CBC    Component Value Date/Time   WBC 4.5 07/10/2018 0443   RBC 3.46 (L) 07/10/2018 0443   HGB 10.1 (L) 07/10/2018 0443   HCT 31.3 (L) 07/10/2018 0443   PLT 158 07/10/2018 0443   MCV 90.5 07/10/2018 0443   MCH 29.2 07/10/2018 0443   MCHC 32.3 07/10/2018 0443   RDW 17.2 (H) 07/10/2018 0443   LYMPHSABS 0.4 (L) 07/10/2018 0443   MONOABS 0.4 07/10/2018 0443   EOSABS 0.3 07/10/2018 0443   BASOSABS 0.0 07/10/2018 0443   HEPATIC Function Panel Recent Labs    07/04/18 1102  PROT 5.9*   HEMOGLOBIN A1C No components found for: HGA1C,  MPG CARDIAC ENZYMES Lab Results  Component Value Date   TROPONINI 0.47 (HH) 07/05/2018   TROPONINI 0.48 (HH) 07/04/2018   TROPONINI 0.50 (HH) 07/04/2018   BNP No results for input(s): PROBNP in the last 8760 hours. TSH No results for input(s): TSH in the last 8760 hours. CHOLESTEROL No results for input(s): CHOL in the last 8760 hours.  Scheduled Meds: . amiodarone  200 mg Oral Daily  .  carvedilol  25 mg Oral BID  . chlorhexidine  15 mL Mouth Rinse BID  . cloNIDine  0.2 mg Transdermal Q Sun  . dextromethorphan-guaiFENesin  2 tablet Oral BID  . diltiazem  90 mg Oral Q6H  . ezetimibe  10 mg Oral Daily  . furosemide  40 mg Intravenous Daily  . insulin aspart  0-5 Units Subcutaneous QHS  . insulin aspart  0-9 Units Subcutaneous TID WC  . mouth rinse  15 mL Mouth Rinse q12n4p  . multivitamin with minerals  1 tablet Oral Daily  . NIFEdipine  60 mg Oral BID  . polyethylene glycol  17 g Oral Daily  . senna-docusate  2 tablet Oral BID   Continuous Infusions: . pantoprazole (PROTONIX) IV 80 mg (07/10/18 0941)   PRN Meds:.acetaminophen, albuterol, hydrALAZINE, ipratropium-albuterol, ondansetron (ZOFRAN) IV  Assessment/Plan: Paroxysmal atrial fibrillation,  CHA2DS2VASc score of  4 Acute GI bleed Anemia of acute blood loss Abnormal troponin I from demand ischemia HTN Type 2 DM CKD, 2 Morbid obesity Rheumatoid arthritis  Echocardiogram for LVF.   LOS: 6 days    Dixie Dials  MD  07/10/2018, 9:51 AM

## 2018-07-10 NOTE — Progress Notes (Signed)
Physical Therapy Treatment Patient Details Name: Jade Mclean MRN: [id number] DOB: Oct 31, 1945 Today's Date: 07/10/2018    History of Present Illness Pt adm to [facility] with acute GI bleed. Pt with recent PE and started blood thinners. Pt transfused 2 units of blood and transferred to [facility] for GI evaluation. Cardiac eval for elevated troponin and concluded abnormal troponin due to demand ischemia. PMH - recent PE, recent diagnosis chf, DM, HTN, CKD, morbid obesitiy.    PT Comments    Patient progressing slowly towards her physical therapy goals. Continues to require min-moderate assistance for transfers from bed to chair using walker. Will attempt to progress ambulation next session. D/c plan remains appropriate.    Follow Up Recommendations  Supervision/Assistance - 24 hour;SNF     Equipment Recommendations  None recommended by PT    Recommendations for Other Services OT consult     Precautions / Restrictions Precautions Precautions: Fall Restrictions Weight Bearing Restrictions: No    Mobility  Bed Mobility Overal bed mobility: Needs Assistance Bed Mobility: Supine to Sit     Supine to sit: Min assist;Mod assist     General bed mobility comments: Assist to elevate trunk into sitting and use of bed pad to scoot hips to edge of bed  Transfers Overall transfer level: Needs assistance Equipment used: Rolling walker (2 wheeled) Transfers: Sit to/from [facility] Sit to Stand: Mod assist Stand pivot transfers: Min assist       General transfer comment: Mod assist to stand and minA to stand pivot from bed to chair  Ambulation/Gait                 Stairs             Wheelchair Mobility    Modified Rankin (Stroke Patients Only)       Balance Overall balance assessment: Needs assistance Sitting-balance support: No upper extremity supported;Feet supported Sitting balance-Leahy Scale: Fair     Standing balance support:  Bilateral upper extremity supported;During functional activity Standing balance-Leahy Scale: Poor Standing balance comment: relies on UE support                            Cognition Arousal/Alertness: Awake/alert Behavior During Therapy: WFL for tasks assessed/performed Overall Cognitive Status: Within Functional Limits for tasks assessed                                        Exercises General Exercises - Lower Extremity Ankle Circles/Pumps: 20 reps;Both;Supine Heel Slides: 5 reps;Both;Supine    General Comments  VSS on O2      Pertinent Vitals/Pain Pain Assessment: No/denies pain    Home Living                      Prior Function            PT Goals (current goals can now be found in the care plan section) Acute Rehab PT Goals Patient Stated Goal: return home Progress towards PT goals: Progressing toward goals    Frequency    Min 3X/week      PT Plan Current plan remains appropriate    Co-evaluation              AM-PAC PT "6 Clicks" Mobility   Outcome Measure  Help needed turning from your back to your side while  in a flat bed without using bedrails?: A Little Help needed moving from lying on your back to sitting on the side of a flat bed without using bedrails?: A Little Help needed moving to and from a bed to a chair (including a wheelchair)?: A Little Help needed standing up from a chair using your arms (e.g., wheelchair or bedside chair)?: A Little Help needed to walk in hospital room?: Total Help needed climbing 3-5 steps with a railing? : Total 6 Click Score: 14    End of Session Equipment Utilized During Treatment: Oxygen;Gait belt Activity Tolerance: Patient tolerated treatment well Patient left: with call bell/phone within reach;in chair;with chair alarm set Nurse Communication: Mobility status PT Visit Diagnosis: Other abnormalities of gait and mobility (R26.89);Muscle weakness (generalized)  (M62.81)     Time: 2947-6546 PT Time Calculation (min) (ACUTE ONLY): 19 min  Charges:  $Therapeutic Activity: 8-22 mins                     Ellamae Sia, PT, DPT Acute Rehabilitation Services Pager [phone] Office [phone]    Willy Eddy 07/10/2018, 5:13 PM

## 2018-07-11 ENCOUNTER — Inpatient Hospital Stay (HOSPITAL_COMMUNITY): Payer: Medicare Other

## 2018-07-11 DIAGNOSIS — I340 Nonrheumatic mitral (valve) insufficiency: Secondary | ICD-10-CM

## 2018-07-11 DIAGNOSIS — I361 Nonrheumatic tricuspid (valve) insufficiency: Secondary | ICD-10-CM

## 2018-07-11 DIAGNOSIS — I370 Nonrheumatic pulmonary valve stenosis: Secondary | ICD-10-CM

## 2018-07-11 LAB — CBC WITH DIFFERENTIAL/PLATELET
Abs Immature Granulocytes: 0.01 10*3/uL (ref 0.00–0.07)
BASOS PCT: 0 %
Basophils Absolute: 0 10*3/uL (ref 0.0–0.1)
Eosinophils Absolute: 0.4 10*3/uL (ref 0.0–0.5)
Eosinophils Relative: 11 %
HCT: 31.1 % — ABNORMAL LOW (ref 36.0–46.0)
Hemoglobin: 9.7 g/dL — ABNORMAL LOW (ref 12.0–15.0)
Immature Granulocytes: 0 %
Lymphocytes Relative: 14 %
Lymphs Abs: 0.5 10*3/uL — ABNORMAL LOW (ref 0.7–4.0)
MCH: 28 pg (ref 26.0–34.0)
MCHC: 31.2 g/dL (ref 30.0–36.0)
MCV: 89.6 fL (ref 80.0–100.0)
Monocytes Absolute: 0.4 10*3/uL (ref 0.1–1.0)
Monocytes Relative: 10 %
NEUTROS PCT: 65 %
Neutro Abs: 2.4 10*3/uL (ref 1.7–7.7)
Platelets: 156 10*3/uL (ref 150–400)
RBC: 3.47 MIL/uL — ABNORMAL LOW (ref 3.87–5.11)
RDW: 16.6 % — ABNORMAL HIGH (ref 11.5–15.5)
WBC: 3.6 10*3/uL — AB (ref 4.0–10.5)
nRBC: 0 % (ref 0.0–0.2)

## 2018-07-11 LAB — BASIC METABOLIC PANEL
Anion gap: 11 (ref 5–15)
BUN: 49 mg/dL — ABNORMAL HIGH (ref 8–23)
CO2: 20 mmol/L — ABNORMAL LOW (ref 22–32)
Calcium: 8.2 mg/dL — ABNORMAL LOW (ref 8.9–10.3)
Chloride: 102 mmol/L (ref 98–111)
Creatinine, Ser: 2.99 mg/dL — ABNORMAL HIGH (ref 0.44–1.00)
GFR calc Af Amer: 17 mL/min — ABNORMAL LOW (ref >60)
GFR calc non Af Amer: 15 mL/min — ABNORMAL LOW (ref >60)
Glucose, Bld: 171 mg/dL — ABNORMAL HIGH (ref 70–99)
Potassium: 4 mmol/L (ref 3.5–5.1)
Sodium: 133 mmol/L — ABNORMAL LOW (ref 135–145)

## 2018-07-11 LAB — GLUCOSE, CAPILLARY
Glucose-Capillary: 151 mg/dL — ABNORMAL HIGH (ref 70–99)
Glucose-Capillary: 159 mg/dL — ABNORMAL HIGH (ref 70–99)
Glucose-Capillary: 117 mg/dL — ABNORMAL HIGH (ref 70–99)
Glucose-Capillary: 92 mg/dL (ref 70–99)

## 2018-07-11 MED ORDER — DILTIAZEM HCL 60 MG PO TABS
30.0000 mg | ORAL_TABLET | Freq: Four times a day (QID) | ORAL | Status: DC
  Administered 2018-07-11: 30 mg via ORAL
  Filled 2018-07-11 (×2): qty 1

## 2018-07-11 MED ORDER — PANTOPRAZOLE SODIUM 40 MG PO TBEC
40.0000 mg | DELAYED_RELEASE_TABLET | Freq: Two times a day (BID) | ORAL | Status: DC
  Administered 2018-07-11 – 2018-07-14 (×6): 40 mg via ORAL
  Filled 2018-07-11 (×7): qty 1

## 2018-07-11 MED ORDER — HYDRALAZINE HCL 20 MG/ML IJ SOLN: 10.0000 mg | Freq: Four times a day (QID) | INTRAMUSCULAR | Status: DC | PRN

## 2018-07-11 NOTE — Progress Notes (Signed)
Subjective:  Patient denies any chest pain or shortness of breath.  Denies palpitation lightheadedness.  Remains in sinus rhythm.  Objective:  Vital Signs in the last 24 hours: Temp:  [97.5 F (36.4 C)-98.8 F (37.1 C)] 97.5 F (36.4 C) (01/04 0814) Pulse Rate:  [47-55] 48 (01/04 0814) BP: (132-142)/(45-53) 136/45 (01/04 0814) SpO2:  [94 %-99 %] 99 % (01/04 0814) Weight:  [103.1 kg] 103.1 kg (01/04 0417)  Intake/Output from previous day: 01/03 0701 - 01/04 0700 In: 656.8 [P.O.:550; IV Piggyback:106.8] Out: 500 [Urine:500] Intake/Output from this shift: No intake/output data recorded.  Physical Exam: Neck: no adenopathy, no carotid bruit, no JVD and supple, symmetrical, trachea midline Lungs: Clear to auscultation anterolaterally Heart: Bradycardic S1-S2 normal 2/6 systolic murmur Abdomen: soft, non-tender; bowel sounds normal; no masses,  no organomegaly Extremities: extremities normal, atraumatic, no cyanosis or edema  Lab Results: Recent Labs    07/10/18 0443 07/11/18 0334  WBC 4.5 3.6*  HGB 10.1* 9.7*  PLT 158 156   Recent Labs    07/10/18 0443 07/11/18 0334  NA 136 133*  K 4.2 4.0  CL 103 102  CO2 21* 20*  GLUCOSE 182* 171*  BUN 44* 49*  CREATININE 2.65* 2.99*   No results for input(s): TROPONINI in the last 72 hours.  Invalid input(s): CK, MB Hepatic Function Panel No results for input(s): PROT, ALBUMIN, AST, ALT, ALKPHOS, BILITOT, BILIDIR, IBILI in the last 72 hours. No results for input(s): CHOL in the last 72 hours. No results for input(s): PROTIME in the last 72 hours.  Imaging: Imaging results have been reviewed and No results found.  Cardiac Studies:  Assessment/Plan:  Paroxysmal atrial fibrillation, CHA2DS2VASc score of  4 Status post acute GI bleed Anemia of acute blood loss Abnormal troponin I from demand ischemia HTN Type 2 DM CKD, 2 Morbid obesity Rheumatoid arthritis Plan Continue present management Increase ambulation as  tolerated  LOS: 7 days    Charolette Forward 07/11/2018, 10:22 AM

## 2018-07-11 NOTE — Progress Notes (Addendum)
PROGRESS NOTE  Jade Mclean ERD:[id number] DOB: 1946/06/27 DOA: 07/04/2018 PCP: Patient, No Pcp Per  HPI/Recap of past 24 hours:  Jade Mclean is a 73 y.o. female with past medical history significant for recently diagnosed pulmonary embolism on Eliquis and unspecified CHF, CKD 4, type 2 diabetes, hypertension, who presented from home last night via EMS to [facility] ED due to extreme fatigue and somnolence as noted by family members.  She was transported to [facility] ED where she was found to have a hemoglobin of 5 and a positive FOBT.  No GI services at [facility] at that time therefore the patient was transferred to [facility] for possible GI evaluation.  Patient received 2 units of PRBCs.  Repeat H&H 7.4. Pt still symptomatic.additional 2 unit PRBCs to be transfused today.  07/05/2018: Somnolent prior to the 2 additional units of PRBCs to be transfused.  EGD completed by Dr. Collene Mares with findings of multiple large superficial duodenal ulcers.  Eliquis has been on hold for the last 2 days.  Interventional radiology has been consulted for possible IVC filter placement ASAP.  Discussed with interventional radiology who reviewed the VQ scan reported to be negative.  Initial positive results were due to artifacts. 07/06/2018: Rapid response called overnight due to respiratory distress after blood transfusion.  Chest x-ray done independently reviewed which revealed mild increase in pulmonary vascularity suggestive of mild pulmonary edema.  Given 1 dose of IV Lasix 40 mg.  We will give another dose of 40 mg of IV Lasix.  ABG done which was unremarkable.  Patient placed on 3 L of oxygen by nasal cannula saturating 100%. 07/07/2018: Diffuse rash with unclear etiology and was treated for allergic reaction.  Rash is resolving.  States she feels much better today. 07/08/2018: Rash has resolved.  She has no new complaints.  Hemoglobin dropped this morning.  Reports constipation, no bowel movements for at  least 3 days.  Start bowel regimen. 07/09/2018: Patient went into A. fib overnight which was confirmed with twelve-lead EKG.  Cardiology has been made aware.  Started amiodarone 200 mg daily and nifedipine switched to Cardizem immediate release 60 mg every 6 hours. 07/10/2018: Worsening renal function this a.m.  On IV Lasix.    07/11/2018: Patient examined her bedside.  No acute events overnight.  She has no new complaints.  Denies chest pain or palpitations or dyspnea at rest.  Creatinine continues to worsen.  DC IV Lasix.  Ordered renal ultrasound.  Nephrology consulted.  Assessment/Plan: Active Problems:   GI bleeding   Acute blood loss anemia  Acute upper GI bleed secondary to multiple large superficial duodenal ulcers Findings from EGD done by GI Dr. Collene Mares on 07/05/2018 Stop IV Protonix and start p.o. Protonix 40 mg twice daily Discontinue Eliquis due to negative VQ scan No evidence of PE or prior PE. Received 4 units of PRBCs Hemo-globin stable no sign of overt bleeding  New A. fib with moderate ventricular response Patient denies any previous history of A. Fib Cardiology following; Chads Vasc score 4 Currently on amiodarone 200 mg daily, Coreg 25 mg twice daily and p.o. diltiazem immediate release 30 mg every 6 hours as recommended by cardiology Diltiazem dose has been reduced by cardiology today to sinus bradycardia Continue to hold Eliquis due to recent GI bleed Continue to closely monitor on telemetry  Intermittent sinus bradycardia Obtain twelve-lead EKG Diltiazem dose reduced by cardiology from 60 to 30 mg every 6 hours Also on Coreg 25 twice daily, amiodarone 200  mg daily and clonidine patch Continue to closely monitor on telemetry  Worsening AKI on CKD 3 Creatinine today 2.99 from 2.65 from 2.24 DC IV Lasix Net +1.9 L since admission No urine output recorded; question accuracy Nephrology has been consulted Avoid nephrotoxic agents Renal ultrasound ordered and  pending  Non-anion gap metabolic acidosis suspect secondary to renal failure Bicarb 21 with worsening creatinine  Suspected HCAP, present on admission Completed 5 days of  IV cefepime Dc cefepime on 07/10/18  Acute on chronic blood loss anemia/iron deficiency anemia suspect secondary to multiple large superficial duodenal ulcers erosions Management as stated above Iron deficiency anemia Continue ferrous sulfate 325 mg daily Hemoglobin stable No sign of overt bleeding  Resolving chronic constipation Continue bowel regimen with 2 tablets of Senokot twice daily and MiraLAX daily  Resolving diffuse rash of unclear etiology Treated on 07/06/2018 for allergic reaction with IV Solu-Medrol and IV Benadryl Rash is resolving Continue to closely monitor  Improving acute hypoxic respiratory failure suspect secondary to acute pulmonary edema from blood transfusion Received IV Lasix total of 80 mg today O2 saturation 100% on 3 L currently Wean off oxygen as tolerated  Bilateral lower extremity tenderness Bilateral lower extremity duplex ultrasound pending  Elevated troponin Trending down Peaked at 0.50 on 07/04/2018 Cardiology consulted and cleared for endoscopy which was done on 07/05/2018  Recently diagnosed unspecified CHF BNP elevated greater than 17,000 We will need to obtain last 2D echo records from [facility] Cardiology consult for clearance due to possible endoscopy Continue strict I's and O's and daily weight  Recently diagnosed acute PE; ruled out VQ scan reviewed by interventional radiology and reported to be low probability for PE Prior positive results were due to artifacts  Uncontrolled Hypertension On clonidine 0.2 mg twice daily Resume home dose Coreg 25 mg twice daily and nifedipine Monitor vital signs closely Hydralazine IV 10 mg every 6 hours PRN for systolic blood pressure greater than 601 or diastolic greater than 093  Type 2 diabetes Hemoglobin A1c 4.7 on  07/06/2018 Avoid hypoglycemia  Ambulatory dysfunction with falls/physical debility Uses a walker and cane at baseline PT assessed and recommended SNF Patient is agreeable to SNF Social worker consulted for placement OOB to chair as tolerated Ambulation with assist every shift  Rheumatoid arthritis Appears stable Denies use of NSAIDs On Enbrel qFriday Received Enbrel yesterday    Risks: High risk for decompensation due to severe symptomatic anemia with hemoglobin of 5, acute GI bleed secondary to multiple superficial duodenal ulcers, elevated troponin, multiple comorbidities and advanced age.  DVT prophylaxis:  SCDs  Code Status: DNR  Family Communication:  Updated niece at bedside.  All questions answered to her satisfaction.  Disposition Plan:  Discharge to SNF possibly tomorrow 07-10-18  Consults called: GI, Dr. Collene Mares; cardiology        Objective: Vitals:   07/11/18 0216 07/11/18 0417 07/11/18 0814 07/11/18 1132  BP:  (!) 137/47 (!) 136/45 (!) 139/43  Pulse: (!) 51 (!) 47 (!) 48 (!) 49  Resp:    18  Temp:  97.6 F (36.4 C) (!) 97.5 F (36.4 C) (!) 97.5 F (36.4 C)  TempSrc:  Oral Oral Oral  SpO2: 94% 98% 99% 96%  Weight:  103.1 kg    Height:        Intake/Output Summary (Last 24 hours) at 07/11/2018 1617 Last data filed at 07/11/2018 0900 Gross per 24 hour  Intake 656.83 ml  Output 500 ml  Net 156.83 ml   Danley Danker  Weights   07/08/18 0640 07/10/18 0429 07/11/18 0417  Weight: 102.2 kg 99.9 kg 103.1 kg    Exam:  . General: 73 y.o. year-old adult obese no acute distress.  Alert and oriented x3.  Cardiovascular: Rate and rhythm with no rubs or gallops.  No JVD or thyromegaly respiratory: Clear to auscultation with no wheezes rales.  Good inspiratory effort. . Abdomen: Soft nontender nondistended with normal bowel sounds x4 quadrants. . Musculoskeletal: No LE edema. 2/4 pulses in all 4 extremities. Marland Kitchen Psychiatry: Mood is appropriate for condition  and setting   Data Reviewed: CBC: Recent Labs  Lab 07/07/18 0350 07/08/18 0435 07/08/18 0657 07/09/18 0319 07/10/18 0443 07/11/18 0334  WBC 7.4 8.0  --  5.2 4.5 3.6*  NEUTROABS 7.1 7.3  --  4.1 3.3 2.4  HGB 11.3* 9.8* 10.0* 10.0* 10.1* 9.7*  HCT 35.1* 30.7* 30.2* 32.3* 31.3* 31.1*  MCV 90.5 89.5  --  90.7 90.5 89.6  PLT 222 197  --  177 158 979   Basic Metabolic Panel: Recent Labs  Lab 07/07/18 0350 07/08/18 0435 07/09/18 0319 07/10/18 0443 07/11/18 0334  NA 139 138 139 136 133*  K 4.0 3.9 3.6 4.2 4.0  CL 106 104 105 103 102  CO2 18* 27 24 21* 20*  GLUCOSE 209* 194* 139* 182* 171*  BUN 31* 41* 40* 44* 49*  CREATININE 2.27* 2.28* 2.24* 2.65* 2.99*  CALCIUM 8.0* 8.0* 7.8* 8.0* 8.2*   GFR: Estimated Creatinine Clearance (by C-G formula based on SCr of 2.99 mg/dL (H)) Female: 20.5 mL/min (A) Female: 25 mL/min (A) Liver Function Tests: No results for input(s): AST, ALT, ALKPHOS, BILITOT, PROT, ALBUMIN in the last 168 hours. No results for input(s): LIPASE, AMYLASE in the last 168 hours. No results for input(s): AMMONIA in the last 168 hours. Coagulation Profile: No results for input(s): INR, PROTIME in the last 168 hours. Cardiac Enzymes: Recent Labs  Lab 07/04/18 1625 07/04/18 2218 07/05/18 0531  TROPONINI 0.50* 0.48* 0.47*   BNP (last 3 results) No results for input(s): PROBNP in the last 8760 hours. HbA1C: No results for input(s): HGBA1C in the last 72 hours. CBG: Recent Labs  Lab 07/10/18 1121 07/10/18 1642 07/10/18 2158 07/11/18 0801 07/11/18 1131  GLUCAP 171* 121* 146* 151* 159*   Lipid Profile: No results for input(s): CHOL, HDL, LDLCALC, TRIG, CHOLHDL, LDLDIRECT in the last 72 hours. Thyroid Function Tests: No results for input(s): TSH, T4TOTAL, FREET4, T3FREE, THYROIDAB in the last 72 hours. Anemia Panel: No results for input(s): VITAMINB12, FOLATE, FERRITIN, TIBC, IRON, RETICCTPCT in the last 72 hours. Urine analysis: No results found  for: COLORURINE, APPEARANCEUR, LABSPEC, [place], GLUCOSEU, HGBUR, BILIRUBINUR, [place], [place], UROBILINOGEN, NITRITE, LEUKOCYTESUR Sepsis Labs: @LABRCNTIP (procalcitonin:4,lacticidven:4)  ) Recent Results (from the past 240 hour(s))  MRSA PCR Screening     Status: None   Collection Time: 07/04/18 11:57 PM  Result Value Ref Range Status   MRSA by PCR NEGATIVE NEGATIVE Final    Comment:        The GeneXpert MRSA Assay (FDA approved for NASAL specimens only), is one component of a comprehensive MRSA colonization surveillance program. It is not intended to diagnose MRSA infection nor to guide or monitor treatment for MRSA infections. Performed at [facility], [place] [address] [place], [place] [address]       Studies: US Renal  Result Date: 07/11/2018 CLINICAL DATA:  Acute kidney injury EXAM: RENAL / URINARY TRACT ULTRASOUND COMPLETE COMPARISON:  None. FINDINGS: Right Kidney: Renal measurements:  8.9 x 3.8 x 4.9 cm = volume: 85 mL . Echogenicity within normal limits. No mass or hydronephrosis visualized. Left Kidney: Renal measurements: 12.3 x 7.9 x 8.1 cm = volume: 411 mL. Echogenicity within normal limits. No mass or hydronephrosis visualized. Mild cortical thinning. Bladder: Appears normal for degree of bladder distention. IMPRESSION: Left kidney is significantly larger than that of the right kidney. This may be due to technical factors. Chronic right renal artery stenosis can give a similar appearance. No hydronephrosis or evidence of acute urinary pathology. Electronically Signed   By: Marybelle Killings M.D.   On: 07/11/2018 11:02    Scheduled Meds: . amiodarone  200 mg Oral Daily  . carvedilol  25 mg Oral BID  . chlorhexidine  15 mL Mouth Rinse BID  . cloNIDine  0.2 mg Transdermal Q Sun  . dextromethorphan-guaiFENesin  2 tablet Oral BID  . diltiazem  30 mg Oral Q6H  . etanercept  25 mg Subcutaneous Q Fri  . ezetimibe  10 mg Oral Daily  . insulin aspart  0-5 Units  Subcutaneous QHS  . insulin aspart  0-9 Units Subcutaneous TID WC  . mouth rinse  15 mL Mouth Rinse q12n4p  . multivitamin with minerals  1 tablet Oral Daily  . polyethylene glycol  17 g Oral Daily  . senna-docusate  2 tablet Oral BID    Continuous Infusions: . pantoprazole (PROTONIX) IV 80 mg (07/11/18 1145)     LOS: 7 days     Kayleen Memos, MD [facility] Pager [phone]  If 7PM-7AM, please contact night-coverage [email or web address] Password [email or web address] 07/11/2018, 4:17 PM

## 2018-07-11 NOTE — Progress Notes (Signed)
  Echocardiogram 2D Echocardiogram has been performed.  Jade Mclean 07/11/2018, 5:27 PM

## 2018-07-11 NOTE — Consult Note (Signed)
[facility]    NEPHROLOGY CONSULTATION NOTE  PATIENT ID:  Jade Mclean, DOB:  08/20/1945  HPI: The patient is a 73 y.o. year old adult female who presented on 07/04/2018 due to extreme fatigue and somnolence.  She was recently admitted to [facility] on 06/22/2018 after falling.  She was diagnosed at that time with acute pulmonary embolism, new congestive heart failure, and CKD stage IV.  She was released on 06/28/2018 with home health PT.  After her discharge, she gradually became more fatigued and somnolent.  She ultimately was transported to [facility] ED where she was found to have a hemoglobin of 5 and a positive fecal occult blood test.  Due to the lack of GI services, she was transferred to [facility] for possible GI evaluation.  She received multiple transfusions for her low hemoglobin.  Her creatinine at [facility] was 2.3.  She is being treated currently for atrial fibrillation.  She is thought to have acute hypoxemic respiratory failure due to acute pulmonary edema for blood transfusion.  She has received several doses of IV furosemide, but continues to have a greater than 1.8L+ fluid balance.  Her serum creatinine has worsened to 2.9.  Renal consultation has been called for worsening renal function and volume management.  Of note, she had a rash that began on 07/06/2018 and was treated with IV Solu-Medrol.   Past Medical History:  Diagnosis Date  . CHF (congestive heart failure) ([place])     Past Surgical History:  Procedure Laterality Date  . ESOPHAGOGASTRODUODENOSCOPY (EGD) WITH PROPOFOL N/A 07/05/2018   Procedure: ESOPHAGOGASTRODUODENOSCOPY (EGD) WITH PROPOFOL;  Surgeon: Juanita Craver, MD;  Location: [facility] ENDOSCOPY;  Service: Endoscopy;  Laterality: N/A;    History reviewed. No pertinent family history.  Social History   Tobacco Use  . Smoking status: Never Smoker  . Smokeless tobacco: Never Used  Substance Use Topics  . Alcohol use: Never    Frequency: Never   . Drug use: Never    REVIEW OF SYSTEMS: General:  no fatigue, no weakness Head:  no headaches Eyes:  no blurred vision ENT:  no sore throat Neck:  no masses CV:  no chest pain, no orthopnea Lungs: No cough.  Mild intermittent shortness of breath GI:  no nausea or vomiting, no diarrhea GU:  no dysuria or hematuria Skin:  no rashes or lesions Neuro:  no focal numbness or weakness Psych:  no depression or anxiety    PHYSICAL EXAM:  Vitals:   07/11/18 0814 07/11/18 1132  BP: (!) 136/45 (!) 139/43  Pulse: (!) 48 (!) 49  Resp:  18  Temp: (!) 97.5 F (36.4 C) (!) 97.5 F (36.4 C)  SpO2: 99% 96%   I/O last 3 completed shifts: In: 656.8 [P.O.:550; IV Piggyback:106.8] Out: 500 [Urine:500]   General:  AAOx3 NAD HEENT: MMM [place] AT anicteric sclera Neck:  No JVD, no adenopathy CV:  Heart RRR  Lungs:  L/S with fine rales at the bases. Abd:  abd SNT/ND with normal BS GU:  Bladder non-palpable Extremities:  No LE edema. Skin:  No skin rash Psych:  normal mood and affect Neuro:  no focal deficits  MEDICATIONS:   Current Facility-Administered Medications:  .  acetaminophen (TYLENOL) tablet 650 mg, 650 mg, Oral, Q6H PRN, Hall, Carole N, DO .  albuterol (PROVENTIL) (2.5 MG/3ML) 0.083% nebulizer solution 2.5 mg, 2.5 mg, Nebulization, Once PRN, Hall, Carole N, DO .  amiodarone (PACERONE) tablet 200 mg, 200 mg, Oral, Daily, Charolette Forward, MD,  200 mg at 07/10/18 0931 .  carvedilol (COREG) tablet 25 mg, 25 mg, Oral, BID, Charolette Forward, MD, 25 mg at 07/10/18 2258 .  chlorhexidine (PERIDEX) 0.12 % solution 15 mL, 15 mL, Mouth Rinse, BID, Hall, Carole N, DO, 15 mL at 07/11/18 1134 .  cloNIDine (CATAPRES - Dosed in mg/24 hr) patch 0.2 mg, 0.2 mg, Transdermal, Q Sun, Blount, Xenia T, NP, 0.2 mg at 07/06/18 0209 .  dextromethorphan-guaiFENesin (MUCINEX DM) 30-600 MG per 12 hr tablet 2 tablet, 2 tablet, Oral, BID, Irene Pap N, DO, 2 tablet at 07/11/18 1133 .  diltiazem (CARDIZEM) tablet  30 mg, 30 mg, Oral, Q6H, Harwani, Mohan, MD .  etanercept (ENBREL) 25 MG injection SOLR 25 mg, 25 mg, Subcutaneous, Q Fri, Hall, Carole N, DO, 25 mg at 07/10/18 1705 .  ezetimibe (ZETIA) tablet 10 mg, 10 mg, Oral, Daily, Hall, Carole N, DO, 10 mg at 07/11/18 1134 .  hydrALAZINE (APRESOLINE) injection 10 mg, 10 mg, Intravenous, Q6H PRN, Hall, Carole N, DO .  insulin aspart (novoLOG) injection 0-5 Units, 0-5 Units, Subcutaneous, QHS, Kayleen Memos, DO, 2 Units at 07/07/18 2247 .  insulin aspart (novoLOG) injection 0-9 Units, 0-9 Units, Subcutaneous, TID WC, Hall, Carole N, DO, 2 Units at 07/11/18 1148 .  ipratropium-albuterol (DUONEB) 0.5-2.5 (3) MG/3ML nebulizer solution 3 mL, 3 mL, Nebulization, Q6H PRN, Hall, Carole N, DO .  MEDLINE mouth rinse, 15 mL, Mouth Rinse, q12n4p, Hall, Carole N, DO, 15 mL at 07/11/18 1135 .  multivitamin with minerals tablet 1 tablet, 1 tablet, Oral, Daily, Irene Pap N, DO, 1 tablet at 07/11/18 1134 .  ondansetron (ZOFRAN) injection 4 mg, 4 mg, Intravenous, Q6H PRN, Hall, Carole N, DO .  pantoprazole (PROTONIX) 80 mg in sodium chloride 0.9 % 100 mL IVPB, 80 mg, Intravenous, Q12H, Hall, Carole N, DO, Last Rate: 300 mL/hr at 07/11/18 1145, 80 mg at 07/11/18 1145 .  polyethylene glycol (MIRALAX / GLYCOLAX) packet 17 g, 17 g, Oral, Daily, Hall, Carole N, DO, 17 g at 07/10/18 0930 .  senna-docusate (Senokot-S) tablet 2 tablet, 2 tablet, Oral, BID, Kayleen Memos, DO, 2 tablet at 07/10/18 2259     LABS:  CBC Latest Ref Rng & Units 07/11/2018 07/10/2018 07/09/2018  WBC 4.0 - 10.5 K/uL 3.6(L) 4.5 5.2  Hemoglobin 12.0 - 15.0 g/dL 9.7(L) 10.1(L) 10.0(L)  Hematocrit 36.0 - 46.0 % 31.1(L) 31.3(L) 32.3(L)  Platelets 150 - 400 K/uL 156 158 177    CMP Latest Ref Rng & Units 07/11/2018 07/10/2018 07/09/2018  Glucose 70 - 99 mg/dL 171(H) 182(H) 139(H)  BUN 8 - 23 mg/dL 49(H) 44(H) 40(H)  Creatinine 0.44 - 1.00 mg/dL 2.99(H) 2.65(H) 2.24(H)  Sodium 135 - 145 mmol/L 133(L) 136 139   Potassium 3.5 - 5.1 mmol/L 4.0 4.2 3.6  Chloride 98 - 111 mmol/L 102 103 105  CO2 22 - 32 mmol/L 20(L) 21(L) 24  Calcium 8.9 - 10.3 mg/dL 8.2(L) 8.0(L) 7.8(L)  Total Protein 6.5 - 8.1 g/dL - - -  Total Bilirubin 0.3 - 1.2 mg/dL - - -  Alkaline Phos 38 - 126 U/L - - -  AST 15 - 41 U/L - - -  ALT 0 - 44 U/L - - -    Lab Results  Component Value Date   CALCIUM 8.2 (L) 07/11/2018    No results found for: COLORURINE, APPEARANCEUR, LABSPEC, PHURINE, GLUCOSEU, HGBUR, BILIRUBINUR, KETONESUR, PROTEINUR, UROBILINOGEN, NITRITE, LEUKOCYTESUR    Component Value Date/Time   PHART 7.412 07/06/2018 0850  PCO2ART 34.9 07/06/2018 0850   PO2ART 206 (H) 07/06/2018 0850   HCO3 21.9 07/06/2018 0850   ACIDBASEDEF 2.1 (H) 07/06/2018 0850   O2SAT 99.9 07/06/2018 0850       Component Value Date/Time   IRON 29 07/07/2018 1825   TIBC 179 (L) 07/07/2018 1825   FERRITIN 495 (H) 07/07/2018 1825   IRONPCTSAT 16 07/07/2018 1825       ASSESSMENT/PLAN:     Problem List Items Addressed This Visit      Digestive   GI bleeding    Other Visit Diagnoses    Chest pain       Relevant Orders   DG CHEST PORT 1 VIEW (Completed)   Chest pain, unspecified type       Acute respiratory distress       Relevant Orders   DG Chest Port 1 View (Completed)   AKI (acute kidney injury) ([place])       Relevant Orders   US RENAL (Completed)      1.  Chronic kidney disease stage IV with a baseline serum creatinine around 1.9.  I suspect this is related to underlying diabetes mellitus and hypertension.  Will check urinalysis for completeness.  2.  Acute kidney injury.  Likely secondary to diuresis and low hemoglobin.  Does not appear to be markedly fluid overloaded by examination today.  Agree with holding diuretics today.  3.  Acute pulmonary edema.  Etiology could be transfusions versus acute uncontrolled atrial fibrillation.  Appears clinically stable.  Will reevaluate tomorrow for possible resumption of the  diuretics.    [place], DO, [place]

## 2018-07-12 LAB — URINALYSIS, ROUTINE W REFLEX MICROSCOPIC
Glucose, UA: NEGATIVE mg/dL
Ketones, ur: 5 mg/dL — AB
Nitrite: NEGATIVE
Protein, ur: 100 mg/dL — AB
Specific Gravity, Urine: 1.021 (ref 1.005–1.030)
pH: 5 (ref 5.0–8.0)

## 2018-07-12 LAB — CBC WITH DIFFERENTIAL/PLATELET
Abs Immature Granulocytes: 0.02 10*3/uL (ref 0.00–0.07)
Basophils Absolute: 0 10*3/uL (ref 0.0–0.1)
Basophils Relative: 0 %
Eosinophils Absolute: 0.5 10*3/uL (ref 0.0–0.5)
Eosinophils Relative: 14 %
HCT: 31.6 % — ABNORMAL LOW (ref 36.0–46.0)
Hemoglobin: 9.9 g/dL — ABNORMAL LOW (ref 12.0–15.0)
Immature Granulocytes: 1 %
Lymphocytes Relative: 13 %
Lymphs Abs: 0.4 10*3/uL — ABNORMAL LOW (ref 0.7–4.0)
MCH: 28.1 pg (ref 26.0–34.0)
MCHC: 31.3 g/dL (ref 30.0–36.0)
MCV: 89.8 fL (ref 80.0–100.0)
Monocytes Absolute: 0.3 10*3/uL (ref 0.1–1.0)
Monocytes Relative: 9 %
Neutro Abs: 2.1 10*3/uL (ref 1.7–7.7)
Neutrophils Relative %: 63 %
Platelets: 162 10*3/uL (ref 150–400)
RBC: 3.52 MIL/uL — ABNORMAL LOW (ref 3.87–5.11)
RDW: 16.3 % — ABNORMAL HIGH (ref 11.5–15.5)
WBC: 3.4 10*3/uL — ABNORMAL LOW (ref 4.0–10.5)
nRBC: 0 % (ref 0.0–0.2)

## 2018-07-12 LAB — BASIC METABOLIC PANEL
Anion gap: 11 (ref 5–15)
BUN: 51 mg/dL — ABNORMAL HIGH (ref 8–23)
CO2: 21 mmol/L — ABNORMAL LOW (ref 22–32)
Calcium: 8.1 mg/dL — ABNORMAL LOW (ref 8.9–10.3)
Chloride: 102 mmol/L (ref 98–111)
Creatinine, Ser: 2.91 mg/dL — ABNORMAL HIGH (ref 0.44–1.00)
GFR calc Af Amer: 18 mL/min — ABNORMAL LOW (ref >60)
GFR calc non Af Amer: 16 mL/min — ABNORMAL LOW (ref >60)
Glucose, Bld: 122 mg/dL — ABNORMAL HIGH (ref 70–99)
Potassium: 3.9 mmol/L (ref 3.5–5.1)
Sodium: 134 mmol/L — ABNORMAL LOW (ref 135–145)

## 2018-07-12 LAB — GLUCOSE, CAPILLARY
Glucose-Capillary: 105 mg/dL — ABNORMAL HIGH (ref 70–99)
Glucose-Capillary: 122 mg/dL — ABNORMAL HIGH (ref 70–99)
Glucose-Capillary: 87 mg/dL (ref 70–99)
Glucose-Capillary: 99 mg/dL (ref 70–99)

## 2018-07-12 LAB — ECHOCARDIOGRAM COMPLETE
Height: 65 in
Weight: 3636.71 oz

## 2018-07-12 MED ORDER — HYDRALAZINE HCL 10 MG PO TABS
10.0000 mg | ORAL_TABLET | Freq: Three times a day (TID) | ORAL | Status: DC
  Administered 2018-07-12 – 2018-07-14 (×6): 10 mg via ORAL
  Filled 2018-07-12 (×6): qty 1

## 2018-07-12 NOTE — Progress Notes (Signed)
[facility]    NEPHROLOGY PROGRESS NOTE  SUBJECTIVE: No acute complaints.  Denies chest pain, shortness of breath, nausea, vomiting, diarrhea or dysuria.  Reports being fatigued and is currently resting.  All other review of systems are negative.   OBJECTIVE:  Vitals:   07/12/18 1608 07/12/18 1712  BP: (!) 165/47 (!) 157/52  Pulse: 63 64  Resp: 16 17  Temp:    SpO2: 100% 97%   I/O last 3 completed shifts: In: 1256.8 [P.O.:1150; IV Piggyback:106.8] Out: 550 [Urine:550]   [place]: Resting but arousable NAD HEENT: MMM [place] AT anicteric sclera Neck:  No JVD, no adenopathy CV:  Heart RRR  Lungs:  L/S CTA bilaterally Abd:  abd SNT/ND with normal BS GU:  Bladder non-palpable Extremities:no lower extremity edema Skin:  No skin rash  MEDICATIONS:   Current Facility-Administered Medications:  .  acetaminophen (TYLENOL) tablet 650 mg, 650 mg, Oral, Q6H PRN, Hall, Carole N, DO .  albuterol (PROVENTIL) (2.5 MG/3ML) 0.083% nebulizer solution 2.5 mg, 2.5 mg, Nebulization, Once PRN, Hall, Carole N, DO .  amiodarone (PACERONE) tablet 200 mg, 200 mg, Oral, Daily, Charolette Forward, MD, 200 mg at 07/12/18 0847 .  carvedilol (COREG) tablet 25 mg, 25 mg, Oral, BID, Charolette Forward, MD, 25 mg at 07/12/18 0852 .  chlorhexidine (PERIDEX) 0.12 % solution 15 mL, 15 mL, Mouth Rinse, BID, Hall, Carole N, DO, 15 mL at 07/12/18 0851 .  cloNIDine (CATAPRES - Dosed in mg/24 hr) patch 0.2 mg, 0.2 mg, Transdermal, Q Sun, Blount, Xenia T, NP, 0.2 mg at 07/12/18 0851 .  dextromethorphan-guaiFENesin (MUCINEX DM) 30-600 MG per 12 hr tablet 2 tablet, 2 tablet, Oral, BID, Kayleen Memos, DO, 2 tablet at 07/12/18 0846 .  etanercept (ENBREL) 25 MG injection SOLR 25 mg, 25 mg, Subcutaneous, Q Fri, Hall, [place] N, DO, 25 mg at 07/10/18 1705 .  ezetimibe (ZETIA) tablet 10 mg, 10 mg, Oral, Daily, Hall, Carole N, DO, 10 mg at 07/12/18 0847 .  hydrALAZINE (APRESOLINE) injection 10 mg, 10 mg, Intravenous, Q6H  PRN, Irene Pap N, DO .  hydrALAZINE (APRESOLINE) tablet 10 mg, 10 mg, Oral, Q8H, Hall, Carole N, DO, 10 mg at 07/12/18 1422 .  insulin aspart (novoLOG) injection 0-5 Units, 0-5 Units, Subcutaneous, QHS, Kayleen Memos, DO, 2 Units at 07/07/18 2247 .  insulin aspart (novoLOG) injection 0-9 Units, 0-9 Units, Subcutaneous, TID WC, Hall, Carole N, DO, 1 Units at 07/12/18 1211 .  ipratropium-albuterol (DUONEB) 0.5-2.5 (3) MG/3ML nebulizer solution 3 mL, 3 mL, Nebulization, Q6H PRN, Hall, Carole N, DO .  MEDLINE mouth rinse, 15 mL, Mouth Rinse, q12n4p, Hall, Carole N, DO, 15 mL at 07/12/18 1710 .  multivitamin with minerals tablet 1 tablet, 1 tablet, Oral, Daily, Irene Pap N, DO, 1 tablet at 07/12/18 0848 .  ondansetron (ZOFRAN) injection 4 mg, 4 mg, Intravenous, Q6H PRN, Hall, Carole N, DO .  pantoprazole (PROTONIX) EC tablet 40 mg, 40 mg, Oral, BID, Irene Pap N, DO, 40 mg at 07/12/18 0847 .  polyethylene glycol (MIRALAX / GLYCOLAX) packet 17 g, 17 g, Oral, Daily, Hall, Carole N, DO, 17 g at 07/12/18 0850 .  senna-docusate (Senokot-S) tablet 2 tablet, 2 tablet, Oral, BID, Kayleen Memos, DO, 2 tablet at 07/12/18 0848     LABS:  CBC Latest Ref Rng & Units 07/12/2018 07/11/2018 07/10/2018  WBC 4.0 - 10.5 K/uL 3.4(L) 3.6(L) 4.5  Hemoglobin 12.0 - 15.0 g/dL 9.9(L) 9.7(L) 10.1(L)  Hematocrit 36.0 - 46.0 % 31.6(L)  31.1(L) 31.3(L)  Platelets 150 - 400 K/uL 162 156 158    CMP Latest Ref Rng & Units 07/12/2018 07/11/2018 07/10/2018  Glucose 70 - 99 mg/dL 122(H) 171(H) 182(H)  BUN 8 - 23 mg/dL 51(H) 49(H) 44(H)  Creatinine 0.44 - 1.00 mg/dL 2.91(H) 2.99(H) 2.65(H)  Sodium 135 - 145 mmol/L 134(L) 133(L) 136  Potassium 3.5 - 5.1 mmol/L 3.9 4.0 4.2  Chloride 98 - 111 mmol/L 102 102 103  CO2 22 - 32 mmol/L 21(L) 20(L) 21(L)  Calcium 8.9 - 10.3 mg/dL 8.1(L) 8.2(L) 8.0(L)  Total Protein 6.5 - 8.1 g/dL - - -  Total Bilirubin 0.3 - 1.2 mg/dL - - -  Alkaline Phos 38 - 126 U/L - - -  AST 15 - 41 U/L - - -   ALT 0 - 44 U/L - - -    Lab Results  Component Value Date   CALCIUM 8.1 (L) 07/12/2018       Component Value Date/Time   COLORURINE YELLOW 07/12/2018 0444   APPEARANCEUR HAZY (A) 07/12/2018 0444   LABSPEC 1.021 07/12/2018 0444   PHURINE 5.0 07/12/2018 0444   GLUCOSEU NEGATIVE 07/12/2018 0444   HGBUR MODERATE (A) 07/12/2018 0444   BILIRUBINUR SMALL (A) 07/12/2018 0444   KETONESUR 5 (A) 07/12/2018 0444   PROTEINUR 100 (A) 07/12/2018 0444   NITRITE NEGATIVE 07/12/2018 0444   LEUKOCYTESUR MODERATE (A) 07/12/2018 0444      Component Value Date/Time   PHART 7.412 07/06/2018 0850   PCO2ART 34.9 07/06/2018 0850   PO2ART 206 (H) 07/06/2018 0850   HCO3 21.9 07/06/2018 0850   ACIDBASEDEF 2.1 (H) 07/06/2018 0850   O2SAT 99.9 07/06/2018 0850       Component Value Date/Time   IRON 29 07/07/2018 1825   TIBC 179 (L) 07/07/2018 1825   FERRITIN 495 (H) 07/07/2018 1825   IRONPCTSAT 16 07/07/2018 1825       ASSESSMENT/PLAN:     Problem List Items Addressed This Visit      Digestive   GI bleeding    Other Visit Diagnoses    Chest pain       Relevant Orders   DG CHEST PORT 1 VIEW (Completed)   Chest pain, unspecified type       Acute respiratory distress       Relevant Orders   DG Chest Port 1 View (Completed)   AKI (acute kidney injury) ([place])       Relevant Orders   US RENAL (Completed)      1.  Chronic kidney disease stage IV with a baseline serum creatinine around 1.9.  I suspect this is related to underlying diabetes mellitus and hypertension.    2.  Acute kidney injury.  Likely secondary to diuresis and low hemoglobin.  Does not appear to be markedly fluid overloaded by examination today.  Agree with holding diuretics today.  Urinalysis with some blood and protein but with white cells concerning for a possible infection.  Creatinine is trending down.  3.  Acute pulmonary edema.  Etiology could be transfusions versus acute uncontrolled atrial fibrillation.  Appears  clinically stable.    Would probably resume oral diuretics tomorrow.  Webster Groves, DO, [place]

## 2018-07-12 NOTE — Progress Notes (Addendum)
PROGRESS NOTE  Amanpreet Delmont NIO:[id number] DOB: [birth year] DOA: 07/04/2018 PCP: Patient, No Pcp Per  HPI/Recap of past 24 hours:  Lannette Avellino is a 73 y.o. female with past medical history significant for recently diagnosed pulmonary embolism on Eliquis which was ruled out, chronic diastolic CHF, CKD 4, type 2 diabetes, hypertension, who presented from home last night via EMS to [facility] ED due to extreme fatigue and somnolence as noted by family members. [facility] ED was found to have a hemoglobin of 5 and a positive FOBT.  No GI services at [facility] at that time therefore the patient was transferred to [facility] for possible GI evaluation.  Patient received 4 units of PRBCs.  EGD done on 07/05/2018 by Dr. Collene Mares with findings of multiple large superficial duodenal ulcers. Discussed with interventional radiology who reviewed the VQ scan reported to be negative.  Initial positive results were due to artifacts.  Hospital course complicated by fluid overload for which she received IV Lasix and subsequently AKI on CKD 4.  Also had a rash with unclear etiology.  Resolved and has not recurred.  On 07/09/2018 patient went into A. fib which was confirmed with twelve-lead EKG.  Was started on amiodarone by cardiology 20 mg daily and p.o. Cardizem.  She converted to sinus bradycardia.  Cardizem dose was lowered however patient remained bradycardic.  Cardizem was DC'd 07/12/2018 by cardiology.   07/12/2018: Patient seen and examined at bedside.  No acute events overnight.  She has no new complaints.  Nephrology is following due to AKI on CKD 4.  Assessment/Plan: Active Problems:   GI bleeding   Acute blood loss anemia  Acute upper GI bleed secondary to multiple large superficial duodenal ulcers Findings from EGD done by GI Dr. Collene Mares on 07/05/2018 Stop IV Protonix and start p.o. Protonix 40 mg twice daily Discontinue Eliquis due to negative VQ scan No evidence of PE or prior PE. Received 4 units  of PRBCs Hemo-globin stable no sign of overt bleeding  New A. fib with moderate ventricular response Patient denies any previous history of A. Fib Cardiology following; Chads Vasc score 4 DC p.o. Cardizem as recommended by cardiology Currently on amiodarone 200 mg daily, Coreg 25 mg twice daily  Continue to hold Eliquis due to recent GI bleed Continue to closely monitor on telemetry  Persistent sinus bradycardia Cardiology DC'd p.o. Cardizem  On Coreg 25 twice daily, amiodarone 200 mg daily and clonidine patch Continue to closely monitor on telemetry  Uncontrolled Hypertension Clonidine patch 0.2 mg every 7 days Added p.o. hydralazine 10 mg 3 times daily Continue Coreg 25 mg twice daily and amiodarone 200 mg daily Continue to closely monitor vital signs Hydralazine IV 10 mg every 6 hours PRN for systolic blood pressure greater than 818 or diastolic greater than 299  AKI on CKD 3 Creatinine today 2.91 from 2.99 Possible baseline creatinine 2.3 at [facility] with +2.3 L since admission; question accuracy Repeat BMP in the morning  Non-anion gap metabolic acidosis suspect secondary to renal failure Bicarb 21 anion gap 11  HCAP, present on admission Completed 5 days of  IV cefepime Dc cefepime on 07/10/18  Acute on chronic blood loss anemia/iron deficiency anemia suspect secondary to multiple large superficial duodenal ulcers erosions Management as stated above Iron deficiency anemia Continue ferrous sulfate 325 mg daily Continue Protonix 40 mg twice daily Hemo-globin is stable No sign of overt bleeding  Resolving chronic constipation Continue bowel regimen with 2 tablets of Senokot twice daily  and MiraLAX daily  Resolving diffuse rash of unclear etiology Treated on 07/06/2018 for allergic reaction with IV Solu-Medrol and IV Benadryl Rash is resolving Continue to closely monitor  Improving acute hypoxic respiratory failure suspect secondary to acute pulmonary  edema from blood transfusion Received IV Lasix total of 80 mg today O2 saturation 100% on 3 L currently Wean off oxygen as tolerated  Bilateral lower extremity tenderness Bilateral lower extremity duplex ultrasound pending  Elevated troponin Trending down Peaked at 0.50 on 07/04/2018 Cardiology consulted and cleared for endoscopy which was done on 07/05/2018  Recently diagnosed unspecified CHF BNP elevated greater than 17,000 We will need to obtain last 2D echo records from [facility] Cardiology consult for clearance due to possible endoscopy Continue strict I's and O's and daily weight  Recently diagnosed acute PE; ruled out VQ scan reviewed by interventional radiology and reported to be low probability for PE Prior positive results were due to artifacts  Type 2 diabetes Hemoglobin A1c 4.7 on 07/06/2018 Avoid hypoglycemia  Ambulatory dysfunction with falls/physical debility Uses a walker and cane at baseline PT assessed and recommended SNF Patient is agreeable to SNF Social worker consulted for placement OOB to chair as tolerated Ambulation with assist every shift  Rheumatoid arthritis Appears stable Denies use of NSAIDs On Enbrel qFriday Received Enbrel yesterday   DVT prophylaxis:  SCDs  Code Status: DNR  Family Communication:  Updated niece at bedside.  All questions answered to her satisfaction.  Disposition Plan:  Discharge to SNF possibly tomorrow 07-10-18  Consults called: GI, Dr. Collene Mares; cardiology        Objective: Vitals:   07/12/18 0747 07/12/18 0800 07/12/18 0900 07/12/18 1118  BP: (!) 153/50 (!) 142/46 (!) 147/44 (!) 155/46  Pulse: (!) 50 (!) 53 (!) 55 62  Resp: (!) 25 15 19 16   Temp: 97.6 F (36.4 C)   98.7 F (37.1 C)  TempSrc: Oral   Oral  SpO2: 100% 100% 100%   Weight:      Height:        Intake/Output Summary (Last 24 hours) at 07/12/2018 1321 Last data filed at 07/12/2018 0900 Gross per 24 hour  Intake 550 ml  Output  400 ml  Net 150 ml   Filed Weights   07/10/18 0429 07/11/18 0417 07/12/18 0604  Weight: 99.9 kg 103.1 kg 101.7 kg    Exam:  . General: 73 y.o. year-old adult obese in no acute distress.  Alert and oriented x3.  Cardiovascular: Regular rate and rhythm with no rubs or gallops no JVD or thyromegaly noted.  Marland Kitchen  Respiratory: Clear to all station with no wheezes or rales.  Good expiratory effort. . Abdomen: Soft nontender nondistended with normal bowel sounds x4 quadrants. . Musculoskeletal: No LE edema. 2/4 pulses in all 4 extremities. Marland Kitchen Psychiatry: Mood is appropriate for condition and setting   Data Reviewed: CBC: Recent Labs  Lab 07/08/18 0435 07/08/18 0657 07/09/18 0319 07/10/18 0443 07/11/18 0334 07/12/18 0409  WBC 8.0  --  5.2 4.5 3.6* 3.4*  NEUTROABS 7.3  --  4.1 3.3 2.4 2.1  HGB 9.8* 10.0* 10.0* 10.1* 9.7* 9.9*  HCT 30.7* 30.2* 32.3* 31.3* 31.1* 31.6*  MCV 89.5  --  90.7 90.5 89.6 89.8  PLT 197  --  177 158 156 308   Basic Metabolic Panel: Recent Labs  Lab 07/08/18 0435 07/09/18 0319 07/10/18 0443 07/11/18 0334 07/12/18 0635  NA 138 139 136 133* 134*  K 3.9 3.6 4.2 4.0 3.9  CL  104 105 103 102 102  CO2 27 24 21* 20* 21*  GLUCOSE 194* 139* 182* 171* 122*  BUN 41* 40* 44* 49* 51*  CREATININE 2.28* 2.24* 2.65* 2.99* 2.91*  CALCIUM 8.0* 7.8* 8.0* 8.2* 8.1*   GFR: Estimated Creatinine Clearance (by C-G formula based on SCr of 2.91 mg/dL (H)) Female: 21 mL/min (A) Female: 25.6 mL/min (A) Liver Function Tests: No results for input(s): AST, ALT, ALKPHOS, BILITOT, PROT, ALBUMIN in the last 168 hours. No results for input(s): LIPASE, AMYLASE in the last 168 hours. No results for input(s): AMMONIA in the last 168 hours. Coagulation Profile: No results for input(s): INR, PROTIME in the last 168 hours. Cardiac Enzymes: No results for input(s): CKTOTAL, CKMB, CKMBINDEX, TROPONINI in the last 168 hours. BNP (last 3 results) No results for input(s): PROBNP in the last  8760 hours. HbA1C: No results for input(s): HGBA1C in the last 72 hours. CBG: Recent Labs  Lab 07/11/18 1131 07/11/18 1623 07/11/18 2131 07/12/18 0742 07/12/18 1115  GLUCAP 159* 117* 92 105* 122*   Lipid Profile: No results for input(s): CHOL, HDL, LDLCALC, TRIG, CHOLHDL, LDLDIRECT in the last 72 hours. Thyroid Function Tests: No results for input(s): TSH, T4TOTAL, FREET4, T3FREE, THYROIDAB in the last 72 hours. Anemia Panel: No results for input(s): VITAMINB12, FOLATE, FERRITIN, TIBC, IRON, RETICCTPCT in the last 72 hours. Urine analysis:    Component Value Date/Time   COLORURINE YELLOW 07/12/2018 0444   APPEARANCEUR HAZY (A) 07/12/2018 0444   LABSPEC 1.021 07/12/2018 0444   PHURINE 5.0 07/12/2018 0444   GLUCOSEU NEGATIVE 07/12/2018 0444   HGBUR MODERATE (A) 07/12/2018 0444   BILIRUBINUR SMALL (A) 07/12/2018 0444   KETONESUR 5 (A) 07/12/2018 0444   PROTEINUR 100 (A) 07/12/2018 0444   NITRITE NEGATIVE 07/12/2018 0444   LEUKOCYTESUR MODERATE (A) 07/12/2018 0444   Sepsis Labs: @LABRCNTIP (procalcitonin:4,lacticidven:4)  ) Recent Results (from the past 240 hour(s))  MRSA PCR Screening     Status: None   Collection Time: 07/04/18 11:57 PM  Result Value Ref Range Status   MRSA by PCR NEGATIVE NEGATIVE Final    Comment:        The GeneXpert MRSA Assay (FDA approved for NASAL specimens only), is one component of a comprehensive MRSA colonization surveillance program. It is not intended to diagnose MRSA infection nor to guide or monitor treatment for MRSA infections. Performed at [facility], [place] [address] [place], [place] [address]       Studies: No results found.  Scheduled Meds: . amiodarone  200 mg Oral Daily  . carvedilol  25 mg Oral BID  . chlorhexidine  15 mL Mouth Rinse BID  . cloNIDine  0.2 mg Transdermal Q Sun  . dextromethorphan-guaiFENesin  2 tablet Oral BID  . etanercept  25 mg Subcutaneous Q Fri  . ezetimibe  10 mg Oral Daily  .  hydrALAZINE  10 mg Oral Q8H  . insulin aspart  0-5 Units Subcutaneous QHS  . insulin aspart  0-9 Units Subcutaneous TID WC  . mouth rinse  15 mL Mouth Rinse q12n4p  . multivitamin with minerals  1 tablet Oral Daily  . pantoprazole  40 mg Oral BID  . polyethylene glycol  17 g Oral Daily  . senna-docusate  2 tablet Oral BID    Continuous Infusions:    LOS: 8 days     Kayleen Memos, MD [facility] Pager [phone]  If 7PM-7AM, please contact night-coverage [email or web address] Password [email or web address] 07/12/2018, 1:21 PM

## 2018-07-12 NOTE — Progress Notes (Signed)
Subjective:  Patient denies any chest pain or shortness of breath.  Remains bradycardic despite reducing dose of Cardizem.  Cardizem was started last night.  No further episodes of A. fib  Objective:  Vital Signs in the last 24 hours: Temp:  [97.5 F (36.4 C)-98.9 F (37.2 C)] 97.6 F (36.4 C) (01/05 0747) Pulse Rate:  [44-65] 53 (01/05 0800) Resp:  [14-26] 15 (01/05 0800) BP: (139-172)/(43-62) 142/46 (01/05 0800) SpO2:  [93 %-100 %] 100 % (01/05 0800) Weight:  [101.7 kg] 101.7 kg (01/05 0604)  Intake/Output from previous day: 01/04 0701 - 01/05 0700 In: 600 [P.O.:600] Out: 400 [Urine:400] Intake/Output from this shift: No intake/output data recorded.  Physical Exam: Neck: no adenopathy, no carotid bruit, no JVD and supple, symmetrical, trachea midline Lungs: clear to auscultation bilaterally Heart: regular rate and rhythm, S1, S2 normal and 2/6 systolic murmur noted Abdomen: soft, non-tender; bowel sounds normal; no masses,  no organomegaly Extremities: extremities normal, atraumatic, no cyanosis or edema  Lab Results: Recent Labs    07/11/18 0334 07/12/18 0409  WBC 3.6* 3.4*  HGB 9.7* 9.9*  PLT 156 162   Recent Labs    07/11/18 0334 07/12/18 0635  NA 133* 134*  K 4.0 3.9  CL 102 102  CO2 20* 21*  GLUCOSE 171* 122*  BUN 49* 51*  CREATININE 2.99* 2.91*   No results for input(s): TROPONINI in the last 72 hours.  Invalid input(s): CK, MB Hepatic Function Panel No results for input(s): PROT, ALBUMIN, AST, ALT, ALKPHOS, BILITOT, BILIDIR, IBILI in the last 72 hours. No results for input(s): CHOL in the last 72 hours. No results for input(s): PROTIME in the last 72 hours.  Imaging: Imaging results have been reviewed and US Renal  Result Date: 07/11/2018 CLINICAL DATA:  Acute kidney injury EXAM: RENAL / URINARY TRACT ULTRASOUND COMPLETE COMPARISON:  None. FINDINGS: Right Kidney: Renal measurements: 8.9 x 3.8 x 4.9 cm = volume: 85 mL . Echogenicity within normal  limits. No mass or hydronephrosis visualized. Left Kidney: Renal measurements: 12.3 x 7.9 x 8.1 cm = volume: 411 mL. Echogenicity within normal limits. No mass or hydronephrosis visualized. Mild cortical thinning. Bladder: Appears normal for degree of bladder distention. IMPRESSION: Left kidney is significantly larger than that of the right kidney. This may be due to technical factors. Chronic right renal artery stenosis can give a similar appearance. No hydronephrosis or evidence of acute urinary pathology. Electronically Signed   By: Marybelle Killings M.D.   On: 07/11/2018 11:02    Cardiac Studies:  Assessment/Plan:  Paroxysmal atrial fibrillation, CHA2DS2VASc score of 4 Status post acute GI bleed Anemia of acute blood loss Abnormal troponin I from demand ischemia HTN Type 2 DM Acute on chronic kidney disease stage IV Morbid obesity Rheumatoid arthritis Plan DC Cardizem Agree with holding Lasix for now Monitor renal function Dr. Doylene Canard will follow as needed from a.m.  LOS: 8 days    Charolette Forward 07/12/2018, 9:36 AM

## 2018-07-12 NOTE — Progress Notes (Signed)
Patient OOB to chair x1 this shift, tolerated sitting up for 2 hours but extremely weak and required 2 person assist to return to bed after sitting up.  Refused to get oob to chair this evening, states she is tired. Educated on importance of movement, verbalized understanding.  Continues to have poor appetite and refusing meals.

## 2018-07-13 LAB — CBC WITH DIFFERENTIAL/PLATELET
Abs Immature Granulocytes: 0.01 10*3/uL (ref 0.00–0.07)
Basophils Absolute: 0 10*3/uL (ref 0.0–0.1)
Basophils Relative: 0 %
Eosinophils Absolute: 0.3 10*3/uL (ref 0.0–0.5)
Eosinophils Relative: 12 %
HCT: 32.9 % — ABNORMAL LOW (ref 36.0–46.0)
Hemoglobin: 10.4 g/dL — ABNORMAL LOW (ref 12.0–15.0)
IMMATURE GRANULOCYTES: 0 %
LYMPHS ABS: 0.4 10*3/uL — AB (ref 0.7–4.0)
Lymphocytes Relative: 16 %
MCH: 28.5 pg (ref 26.0–34.0)
MCHC: 31.6 g/dL (ref 30.0–36.0)
MCV: 90.1 fL (ref 80.0–100.0)
Monocytes Absolute: 0.3 10*3/uL (ref 0.1–1.0)
Monocytes Relative: 11 %
Neutro Abs: 1.6 10*3/uL — ABNORMAL LOW (ref 1.7–7.7)
Neutrophils Relative %: 61 %
Platelets: 159 10*3/uL (ref 150–400)
RBC: 3.65 MIL/uL — ABNORMAL LOW (ref 3.87–5.11)
RDW: 16.6 % — ABNORMAL HIGH (ref 11.5–15.5)
WBC: 2.6 10*3/uL — ABNORMAL LOW (ref 4.0–10.5)
nRBC: 0 % (ref 0.0–0.2)

## 2018-07-13 LAB — GLUCOSE, CAPILLARY
Glucose-Capillary: 101 mg/dL — ABNORMAL HIGH (ref 70–99)
Glucose-Capillary: 69 mg/dL — ABNORMAL LOW (ref 70–99)
Glucose-Capillary: 82 mg/dL (ref 70–99)
Glucose-Capillary: 86 mg/dL (ref 70–99)
Glucose-Capillary: 88 mg/dL (ref 70–99)
Glucose-Capillary: 89 mg/dL (ref 70–99)

## 2018-07-13 LAB — BASIC METABOLIC PANEL
Anion gap: 8 (ref 5–15)
BUN: 40 mg/dL — ABNORMAL HIGH (ref 8–23)
CALCIUM: 8.5 mg/dL — AB (ref 8.9–10.3)
CO2: 21 mmol/L — ABNORMAL LOW (ref 22–32)
Chloride: 107 mmol/L (ref 98–111)
Creatinine, Ser: 2.46 mg/dL — ABNORMAL HIGH (ref 0.44–1.00)
GFR calc Af Amer: 22 mL/min — ABNORMAL LOW (ref >60)
GFR calc non Af Amer: 19 mL/min — ABNORMAL LOW (ref >60)
Glucose, Bld: 110 mg/dL — ABNORMAL HIGH (ref 70–99)
Potassium: 3.4 mmol/L — ABNORMAL LOW (ref 3.5–5.1)
Sodium: 136 mmol/L (ref 135–145)

## 2018-07-13 MED ORDER — AMLODIPINE BESYLATE 5 MG PO TABS
5.0000 mg | ORAL_TABLET | Freq: Every day | ORAL | Status: DC
  Administered 2018-07-13: 5 mg via ORAL
  Filled 2018-07-13: qty 1

## 2018-07-13 MED ORDER — FUROSEMIDE 20 MG PO TABS
20.0000 mg | ORAL_TABLET | Freq: Every day | ORAL | Status: DC
  Administered 2018-07-13 – 2018-07-14 (×2): 20 mg via ORAL
  Filled 2018-07-13 (×2): qty 1

## 2018-07-13 MED ORDER — POTASSIUM CHLORIDE CRYS ER 20 MEQ PO TBCR
40.0000 meq | EXTENDED_RELEASE_TABLET | Freq: Once | ORAL | Status: AC
  Administered 2018-07-13: 40 meq via ORAL
  Filled 2018-07-13: qty 4

## 2018-07-13 MED ORDER — POTASSIUM CHLORIDE CRYS ER 20 MEQ PO TBCR
20.0000 meq | EXTENDED_RELEASE_TABLET | Freq: Once | ORAL | Status: AC
  Administered 2018-07-13: 20 meq via ORAL
  Filled 2018-07-13: qty 1

## 2018-07-13 NOTE — Social Work (Signed)
[facility] has [facility] authorization for patient to admit today. Updated MD, but per MD, patient not yet medically ready. CSW to follow and support with discharge planning.  Estanislado Emms, LCSW [phone]

## 2018-07-13 NOTE — Progress Notes (Addendum)
PROGRESS NOTE  Jade Mclean FBP:[id number] DOB: 03/26/1946 DOA: 07/04/2018 PCP: Patient, No Pcp Per  HPI/Recap of past 24 hours:  Jade Mclean is a 73 y.o. female with past medical history significant for chronic diastolic CHF, CKD 4, type 2 diabetes, hypertension, who presented from home via EMS to [facility] ED due to extreme fatigue and somnolence as noted by family members.  At [facility] ED was found to have a hemoglobin of 5 and a positive FOBT.  No GI services at [facility] at that time. Was transferred to [facility] for GI evaluation.  Patient received 4 units of PRBCs.  EGD done on 07/05/2018 by Dr. Collene Mares revealed multiple large superficial duodenal ulcers.   Hospital course complicated by fluid overload for which she received IV Lasix and subsequently developed AKI on CKD 4.  Also had a brief/treated rash with unclear etiology.  Resolved with no recurrence.  On 07/09/2018 went into A. fib.  Started on amiodarone by cardiology 200 mg daily and p.o. Cardizem.  She converted to sinus bradycardia.  Cardizem dose was lowered then stopped due to persistent bradycardia (07/12/18).   IV lasix stopped due to worsening renal function and Nephrology was consulted on 07/11/2018.  07/13/2018: Patient seen and examined at bedside.  No acute events overnight.  She has no new complaints.  Urine analysis done on 07/12/2018 revealed UA WBC 6-10 and moderate leukocytes.  Patient is asymptomatic, afebrile with no leukocytosis.   Assessment/Plan: Active Problems:   GI bleeding   Acute blood loss anemia  Acute upper GI bleed secondary to multiple large superficial duodenal ulcers Findings from EGD done by GI Dr. Collene Mares on 07/05/2018 Continue Protonix 40 mg twice daily Discontinue Eliquis due to negative VQ scan. No evidence of PE or prior PE. Received 4 units of PRBCs Hemo-globin stable at 10.4 from 9.9.  No sign of overt bleeding Repeat CBC in the morning  New A. fib with moderate ventricular  response Patient denies any previous history of A. Fib Cardiology following; Chads Vasc score 4; oral anticoagulation not started due to recent GI bleed DC p.o. Cardizem as recommended by cardiology due to sinus bradycardia Currently on amiodarone 200 mg daily, Coreg 25 mg twice daily  Continue to closely monitor on telemetry  Resolving sinus bradycardia after stopping p.o. Cardizem Cardiology DC'd p.o. Cardizem on 07/12/2018 Continue Coreg 25 twice daily, amiodarone 200 mg daily and clonidine patch Continue to closely monitor on telemetry  Uncontrolled Hypertension, persistent Clonidine patch 0.2 mg every 7 days Cardiology added amlodipine 5 mg daily Continue p.o. hydralazine 10 mg 3 times daily Continue Coreg 25 mg twice daily and amiodarone 200 mg daily Continue to closely monitor vital signs Hydralazine IV 10 mg every 6 hours PRN for systolic blood pressure greater than 824 or diastolic greater than 235  Asymptomatic pyuria UA revealed WBC 6-10 and moderate leukocyte Asymptomatic Monitor for any changes  AKI on CKD 3, improving after stopping diuretics Creatinine today 2.46 from 2.91 after holding diuretics Possible baseline creatinine 2.3 at [facility] +2.5 L Urine output have not been recorded.  Strict I's and O's and daily weight in place x3 orders. Oliguric with +2.3 L since admission; question accuracy Repeat BMP in the morning  Hypokalemia Potassium 3.4 Repleted with p.o. KCl 40 mEq once  Non-anion gap metabolic acidosis suspect secondary to renal failure Bicarb 21 anion gap 8  HCAP, present on admission Completed 5 days of  IV cefepime Dc cefepime on 07/10/18 Afebrile with no leukocytosis  Acute on chronic blood loss anemia/iron deficiency anemia suspect secondary to multiple large superficial duodenal ulcers erosions Management as stated above Iron deficiency anemia Continue ferrous sulfate 325 mg daily Continue Protonix 40 mg twice daily Hemo-globin is  stable No sign of overt bleeding  Resolving chronic constipation Continue bowel regimen with 2 tablets of Senokot twice daily and MiraLAX daily  Resolving diffuse rash of unclear etiology Treated on 07/06/2018 for allergic reaction with IV Solu-Medrol and IV Benadryl Rash is resolving Continue to closely monitor  Improving acute hypoxic respiratory failure suspect secondary to acute pulmonary edema from blood transfusion Wean off oxygen as tolerated Home O2 evaluation prior to discharge  Bilateral lower extremity tenderness Bilateral lower extremity duplex ultrasound negative for DVT  Elevated troponin Peaked at 0.50 on 07/04/2018 and trended down Cardiology consulted and cleared for endoscopy which was done on 07/05/2018  Recently diagnosed unspecified CHF Presented with BNP elevated greater than 17,000 2D echo done on 07/11/2018 revealed LVEF 60 to 65%.  No regional wall motion abnormalities.  Grade 2 diastolic dysfunction.  Moderate pulmonary hypertension. Cardiology consult for clearance due to possible endoscopy Continue strict I's and O's and daily weight  Moderate pulmonary hypertension Cardiology following  Recently diagnosed acute PE; ruled out VQ scan reviewed by interventional radiology and reported to be low probability for PE Prior positive results were due to artifacts  Type 2 diabetes Hemoglobin A1c 4.7 on 07/06/2018 Avoid hypoglycemia  Ambulatory dysfunction with falls/physical debility Uses a walker and cane at baseline PT assessed and recommended SNF Patient is agreeable to SNF Social worker consulted for placement OOB to chair as tolerated Ambulation with assist every shift  Rheumatoid arthritis Appears stable Denies use of NSAIDs On Enbrel qFriday Received Enbrel yesterday   DVT prophylaxis:  SCDs  Code Status: DNR  Family Communication:  Updated niece at bedside.  All questions answered to her satisfaction.  Disposition Plan:   Discharge to SNF possibly tomorrow 07/14/2018 or when cardiology and nephrology sign off.  Consults called: GI, Dr. Collene Mares; cardiology        Objective: Vitals:   07/12/18 2055 07/13/18 0003 07/13/18 0605 07/13/18 0746  BP: (!) 173/52 (!) 185/53 (!) 156/46 (!) 178/49  Pulse: 72 69 72 97  Resp: 14 (!) 25 12 15   Temp: 98.4 F (36.9 C) 98.9 F (37.2 C)  98.1 F (36.7 C)  TempSrc: Oral Axillary  Oral  SpO2: 96% 96% 98% 98%  Weight:      Height:        Intake/Output Summary (Last 24 hours) at 07/13/2018 1000 Last data filed at 07/13/2018 0900 Gross per 24 hour  Intake 240 ml  Output -  Net 240 ml   Filed Weights   07/10/18 0429 07/11/18 0417 07/12/18 0604  Weight: 99.9 kg 103.1 kg 101.7 kg    Exam:  . General: 73 y.o. year-old adult obese in no acute distress.  Alert and oriented x3.  . Cardiovascular: Regular Rate and Rhythm with No Rubs or Gallops No JVD or Thyromegaly Noted.  Marland Kitchen  Respiratory: Clear to auscultation with no wheezes or rales.  Good inspiratory effort.  . Abdomen: Soft nontender nondistended with normal bowel sounds x4 quadrants. . Musculoskeletal: No LE edema. 2/4 pulses in all 4 extremities. Marland Kitchen Psychiatry: Mood is appropriate for condition and setting   Data Reviewed: CBC: Recent Labs  Lab 07/09/18 0319 07/10/18 0443 07/11/18 0334 07/12/18 0409 07/13/18 0707  WBC 5.2 4.5 3.6* 3.4* 2.6*  NEUTROABS 4.1 3.3 2.4  2.1 1.6*  HGB 10.0* 10.1* 9.7* 9.9* 10.4*  HCT 32.3* 31.3* 31.1* 31.6* 32.9*  MCV 90.7 90.5 89.6 89.8 90.1  PLT 177 158 156 162 009   Basic Metabolic Panel: Recent Labs  Lab 07/09/18 0319 07/10/18 0443 07/11/18 0334 07/12/18 0635 07/13/18 0707  NA 139 136 133* 134* 136  K 3.6 4.2 4.0 3.9 3.4*  CL 105 103 102 102 107  CO2 24 21* 20* 21* 21*  GLUCOSE 139* 182* 171* 122* 110*  BUN 40* 44* 49* 51* 40*  CREATININE 2.24* 2.65* 2.99* 2.91* 2.46*  CALCIUM 7.8* 8.0* 8.2* 8.1* 8.5*   GFR: Estimated Creatinine Clearance (by C-G formula  based on SCr of 2.46 mg/dL (H)) Female: 24.8 mL/min (A) Female: 30.2 mL/min (A) Liver Function Tests: No results for input(s): AST, ALT, ALKPHOS, BILITOT, PROT, ALBUMIN in the last 168 hours. No results for input(s): LIPASE, AMYLASE in the last 168 hours. No results for input(s): AMMONIA in the last 168 hours. Coagulation Profile: No results for input(s): INR, PROTIME in the last 168 hours. Cardiac Enzymes: No results for input(s): CKTOTAL, CKMB, CKMBINDEX, TROPONINI in the last 168 hours. BNP (last 3 results) No results for input(s): PROBNP in the last 8760 hours. HbA1C: No results for input(s): HGBA1C in the last 72 hours. CBG: Recent Labs  Lab 07/12/18 0742 07/12/18 1115 07/12/18 1606 07/12/18 2122 07/13/18 0746  GLUCAP 105* 122* 87 99 101*   Lipid Profile: No results for input(s): CHOL, HDL, LDLCALC, TRIG, CHOLHDL, LDLDIRECT in the last 72 hours. Thyroid Function Tests: No results for input(s): TSH, T4TOTAL, FREET4, T3FREE, THYROIDAB in the last 72 hours. Anemia Panel: No results for input(s): VITAMINB12, FOLATE, FERRITIN, TIBC, IRON, RETICCTPCT in the last 72 hours. Urine analysis:    Component Value Date/Time   COLORURINE YELLOW 07/12/2018 0444   APPEARANCEUR HAZY (A) 07/12/2018 0444   LABSPEC 1.021 07/12/2018 0444   PHURINE 5.0 07/12/2018 0444   GLUCOSEU NEGATIVE 07/12/2018 0444   HGBUR MODERATE (A) 07/12/2018 0444   BILIRUBINUR SMALL (A) 07/12/2018 0444   KETONESUR 5 (A) 07/12/2018 0444   PROTEINUR 100 (A) 07/12/2018 0444   NITRITE NEGATIVE 07/12/2018 0444   LEUKOCYTESUR MODERATE (A) 07/12/2018 0444   Sepsis Labs: @LABRCNTIP (procalcitonin:4,lacticidven:4)  ) Recent Results (from the past 240 hour(s))  MRSA PCR Screening     Status: None   Collection Time: 07/04/18 11:57 PM  Result Value Ref Range Status   MRSA by PCR NEGATIVE NEGATIVE Final    Comment:        The GeneXpert MRSA Assay (FDA approved for NASAL specimens only), is one component of  a comprehensive MRSA colonization surveillance program. It is not intended to diagnose MRSA infection nor to guide or monitor treatment for MRSA infections. Performed at [facility], [place] [address] [place], [place] [address]       Studies: No results found.  Scheduled Meds: . amiodarone  200 mg Oral Daily  . carvedilol  25 mg Oral BID  . chlorhexidine  15 mL Mouth Rinse BID  . cloNIDine  0.2 mg Transdermal Q Sun  . dextromethorphan-guaiFENesin  2 tablet Oral BID  . etanercept  25 mg Subcutaneous Q Fri  . ezetimibe  10 mg Oral Daily  . hydrALAZINE  10 mg Oral Q8H  . insulin aspart  0-5 Units Subcutaneous QHS  . insulin aspart  0-9 Units Subcutaneous TID WC  . mouth rinse  15 mL Mouth Rinse q12n4p  . multivitamin with minerals  1 tablet  Oral Daily  . pantoprazole  40 mg Oral BID  . polyethylene glycol  17 g Oral Daily  . potassium chloride  40 mEq Oral Once  . senna-docusate  2 tablet Oral BID    Continuous Infusions:    LOS: 9 days     Kayleen Memos, MD [facility] Pager [phone]  If 7PM-7AM, please contact night-coverage [email or web address] Password [email or web address] 07/13/2018, 10:00 AM

## 2018-07-13 NOTE — Care Management Important Message (Signed)
Important Message  Patient Details  Name: Maisha Bogen MRN: [id number] Date of Birth: 1945/11/08   Medicare Important Message Given:  Yes    Barb Merino Johnston Maddocks 07/13/2018, 4:37 PM

## 2018-07-13 NOTE — Progress Notes (Signed)
[facility]    NEPHROLOGY PROGRESS NOTE  SUBJECTIVE: No acute complaints.  Denies chest pain, shortness of breath, nausea, vomiting, diarrhea or dysuria.   All other review of systems are negative.  Is getting ready to work with physical therapy.   OBJECTIVE:  Vitals:   07/13/18 0746 07/13/18 1141  BP: (!) 178/49 (!) 187/54  Pulse: 97 69  Resp: 15 17  Temp: 98.1 F (36.7 C) 98.9 F (37.2 C)  SpO2: 98% 99%   I/O last 3 completed shifts: In: 37 [P.O.:690] Out: 200 [Urine:200]   Genearl: Resting but arousable NAD HEENT: MMM [place] AT anicteric sclera Neck:  No JVD, no adenopathy CV:  Heart RRR  Lungs: Lung sounds with fine rales at the bases Bilaterally Abd:  abd SNT/ND with normal BS GU:  Bladder non-palpable Extremities: +1 bilateral lower extremity edema Skin:  No skin rash  MEDICATIONS:   Current Facility-Administered Medications:  .  acetaminophen (TYLENOL) tablet 650 mg, 650 mg, Oral, Q6H PRN, Hall, Carole N, DO .  albuterol (PROVENTIL) (2.5 MG/3ML) 0.083% nebulizer solution 2.5 mg, 2.5 mg, Nebulization, Once PRN, Hall, Carole N, DO .  amiodarone (PACERONE) tablet 200 mg, 200 mg, Oral, Daily, Harwani, Mohan, MD, 200 mg at 07/13/18 1106 .  amLODipine (NORVASC) tablet 5 mg, 5 mg, Oral, Daily, Dixie Dials, MD, 5 mg at 07/13/18 1105 .  carvedilol (COREG) tablet 25 mg, 25 mg, Oral, BID, Charolette Forward, MD, 25 mg at 07/13/18 1000 .  chlorhexidine (PERIDEX) 0.12 % solution 15 mL, 15 mL, Mouth Rinse, BID, Hall, Carole N, DO, 15 mL at 07/13/18 1000 .  cloNIDine (CATAPRES - Dosed in mg/24 hr) patch 0.2 mg, 0.2 mg, Transdermal, Q Sun, Blount, Xenia T, NP, 0.2 mg at 07/12/18 0851 .  dextromethorphan-guaiFENesin (MUCINEX DM) 30-600 MG per 12 hr tablet 2 tablet, 2 tablet, Oral, BID, Irene Pap N, DO, 2 tablet at 07/13/18 1106 .  etanercept (ENBREL) 25 MG injection SOLR 25 mg, 25 mg, Subcutaneous, Q Fri, Hall, [place] N, DO, 25 mg at 07/10/18 1705 .  ezetimibe  (ZETIA) tablet 10 mg, 10 mg, Oral, Daily, Hall, Carole N, DO, 10 mg at 07/13/18 1106 .  hydrALAZINE (APRESOLINE) injection 10 mg, 10 mg, Intravenous, Q6H PRN, Irene Pap N, DO .  hydrALAZINE (APRESOLINE) tablet 10 mg, 10 mg, Oral, Q8H, Hall, Carole N, DO, 10 mg at 07/13/18 0800 .  insulin aspart (novoLOG) injection 0-5 Units, 0-5 Units, Subcutaneous, QHS, Kayleen Memos, DO, 2 Units at 07/07/18 2247 .  insulin aspart (novoLOG) injection 0-9 Units, 0-9 Units, Subcutaneous, TID WC, Hall, Carole N, DO, 1 Units at 07/12/18 1211 .  ipratropium-albuterol (DUONEB) 0.5-2.5 (3) MG/3ML nebulizer solution 3 mL, 3 mL, Nebulization, Q6H PRN, Hall, Carole N, DO .  MEDLINE mouth rinse, 15 mL, Mouth Rinse, q12n4p, Hall, Carole N, DO, 15 mL at 07/13/18 1110 .  multivitamin with minerals tablet 1 tablet, 1 tablet, Oral, Daily, Irene Pap N, DO, 1 tablet at 07/13/18 1106 .  ondansetron (ZOFRAN) injection 4 mg, 4 mg, Intravenous, Q6H PRN, Hall, Carole N, DO .  pantoprazole (PROTONIX) EC tablet 40 mg, 40 mg, Oral, BID, Hall, Carole N, DO, 40 mg at 07/13/18 1000 .  polyethylene glycol (MIRALAX / GLYCOLAX) packet 17 g, 17 g, Oral, Daily, Hall, Carole N, DO, 17 g at 07/12/18 0850 .  senna-docusate (Senokot-S) tablet 2 tablet, 2 tablet, Oral, BID, Kayleen Memos, DO, 2 tablet at 07/13/18 1105     LABS:  CBC Latest  Ref Rng & Units 07/13/2018 07/12/2018 07/11/2018  WBC 4.0 - 10.5 K/uL 2.6(L) 3.4(L) 3.6(L)  Hemoglobin 12.0 - 15.0 g/dL 10.4(L) 9.9(L) 9.7(L)  Hematocrit 36.0 - 46.0 % 32.9(L) 31.6(L) 31.1(L)  Platelets 150 - 400 K/uL 159 162 156    CMP Latest Ref Rng & Units 07/13/2018 07/12/2018 07/11/2018  Glucose 70 - 99 mg/dL 110(H) 122(H) 171(H)  BUN 8 - 23 mg/dL 40(H) 51(H) 49(H)  Creatinine 0.44 - 1.00 mg/dL 2.46(H) 2.91(H) 2.99(H)  Sodium 135 - 145 mmol/L 136 134(L) 133(L)  Potassium 3.5 - 5.1 mmol/L 3.4(L) 3.9 4.0  Chloride 98 - 111 mmol/L 107 102 102  CO2 22 - 32 mmol/L 21(L) 21(L) 20(L)  Calcium 8.9 - 10.3 mg/dL  8.5(L) 8.1(L) 8.2(L)  Total Protein 6.5 - 8.1 g/dL - - -  Total Bilirubin 0.3 - 1.2 mg/dL - - -  Alkaline Phos 38 - 126 U/L - - -  AST 15 - 41 U/L - - -  ALT 0 - 44 U/L - - -    Lab Results  Component Value Date   CALCIUM 8.5 (L) 07/13/2018       Component Value Date/Time   COLORURINE YELLOW 07/12/2018 0444   APPEARANCEUR HAZY (A) 07/12/2018 0444   LABSPEC 1.021 07/12/2018 0444   PHURINE 5.0 07/12/2018 0444   GLUCOSEU NEGATIVE 07/12/2018 0444   HGBUR MODERATE (A) 07/12/2018 0444   BILIRUBINUR SMALL (A) 07/12/2018 0444   KETONESUR 5 (A) 07/12/2018 0444   PROTEINUR 100 (A) 07/12/2018 0444   NITRITE NEGATIVE 07/12/2018 0444   LEUKOCYTESUR MODERATE (A) 07/12/2018 0444      Component Value Date/Time   PHART 7.412 07/06/2018 0850   PCO2ART 34.9 07/06/2018 0850   PO2ART 206 (H) 07/06/2018 0850   HCO3 21.9 07/06/2018 0850   ACIDBASEDEF 2.1 (H) 07/06/2018 0850   O2SAT 99.9 07/06/2018 0850       Component Value Date/Time   IRON 29 07/07/2018 1825   TIBC 179 (L) 07/07/2018 1825   FERRITIN 495 (H) 07/07/2018 1825   IRONPCTSAT 16 07/07/2018 1825       ASSESSMENT/PLAN:     Problem List Items Addressed This Visit      Digestive   GI bleeding    Other Visit Diagnoses    Chest pain       Relevant Orders   DG CHEST PORT 1 VIEW (Completed)   Chest pain, unspecified type       Acute respiratory distress       Relevant Orders   DG Chest Port 1 View (Completed)   AKI (acute kidney injury) ([place])       Relevant Orders   US RENAL (Completed)      1.  Chronic kidney disease stage IV with a baseline serum creatinine around 1.9.  I suspect this is related to underlying diabetes mellitus and hypertension.    Would resume oral furosemide.  Follow-up intact PTH and phosphorus   2.  Acute kidney injury.  Likely secondary to diuresis and low hemoglobin.  Does not appear to be markedly fluid overloaded by examination today.   Creatinine is trending down.  3.  Acute pulmonary  edema.  Etiology could be transfusions versus acute uncontrolled atrial fibrillation.  Appears clinically stable.    Would resume oral diuretics   [employer], DO, FACP

## 2018-07-13 NOTE — Consult Note (Signed)
Ref: Patient, No Pcp Per   Subjective:  Awake. Monitor shows SR. Heart rate in 70's.   Objective:  Vital Signs in the last 24 hours: Temp:  [98.1 F (36.7 C)-98.9 F (37.2 C)] 98.1 F (36.7 C) (01/06 0746) Pulse Rate:  [61-97] 97 (01/06 0746) Cardiac Rhythm: Normal sinus rhythm (01/06 0700) Resp:  [12-25] 15 (01/06 0746) BP: (139-185)/(46-53) 178/49 (01/06 0746) SpO2:  [96 %-100 %] 98 % (01/06 0746)  Physical Exam: BP Readings from Last 1 Encounters:  07/13/18 (!) 178/49     Wt Readings from Last 1 Encounters:  07/12/18 101.7 kg    Weight change:  Body mass index is 37.33 kg/m. HEENT: [place]/AT, Eyes-Brown, PERL, EOMI, Conjunctiva-Pink, Sclera-Non-icteric Neck: No JVD, No bruit, Trachea midline. Lungs:  Clear, Bilateral. Cardiac:  Regular rhythm, normal S1 and S2, no S3. II/VI systolic murmur. Abdomen:  Soft, non-tender. BS present. Extremities:  1 + edema present. No cyanosis. No clubbing. CNS: AxOx3, Cranial nerves grossly intact, moves all 4 extremities.  Skin: Warm and dry.   Intake/Output from previous day: 01/05 0701 - 01/06 0700 In: 390 [P.O.:390] Out: -     Lab Results: BMET    Component Value Date/Time   NA 136 07/13/2018 0707   NA 134 (L) 07/12/2018 0635   NA 133 (L) 07/11/2018 0334   K 3.4 (L) 07/13/2018 0707   K 3.9 07/12/2018 0635   K 4.0 07/11/2018 0334   CL 107 07/13/2018 0707   CL 102 07/12/2018 0635   CL 102 07/11/2018 0334   CO2 21 (L) 07/13/2018 0707   CO2 21 (L) 07/12/2018 0635   CO2 20 (L) 07/11/2018 0334   GLUCOSE 110 (H) 07/13/2018 0707   GLUCOSE 122 (H) 07/12/2018 0635   GLUCOSE 171 (H) 07/11/2018 0334   BUN 40 (H) 07/13/2018 0707   BUN 51 (H) 07/12/2018 0635   BUN 49 (H) 07/11/2018 0334   CREATININE 2.46 (H) 07/13/2018 0707   CREATININE 2.91 (H) 07/12/2018 0635   CREATININE 2.99 (H) 07/11/2018 0334   CALCIUM 8.5 (L) 07/13/2018 0707   CALCIUM 8.1 (L) 07/12/2018 0635   CALCIUM 8.2 (L) 07/11/2018 0334   GFRNONAA 19 (L)  07/13/2018 0707   GFRNONAA 16 (L) 07/12/2018 0635   GFRNONAA 15 (L) 07/11/2018 0334   GFRAA 22 (L) 07/13/2018 0707   GFRAA 18 (L) 07/12/2018 0635   GFRAA 17 (L) 07/11/2018 0334   CBC    Component Value Date/Time   WBC 2.6 (L) 07/13/2018 0707   RBC 3.65 (L) 07/13/2018 0707   HGB 10.4 (L) 07/13/2018 0707   HCT 32.9 (L) 07/13/2018 0707   PLT 159 07/13/2018 0707   MCV 90.1 07/13/2018 0707   MCH 28.5 07/13/2018 0707   MCHC 31.6 07/13/2018 0707   RDW 16.6 (H) 07/13/2018 0707   LYMPHSABS 0.4 (L) 07/13/2018 0707   MONOABS 0.3 07/13/2018 0707   EOSABS 0.3 07/13/2018 0707   BASOSABS 0.0 07/13/2018 0707   HEPATIC Function Panel Recent Labs    07/04/18 1102  PROT 5.9*   HEMOGLOBIN A1C No components found for: HGA1C,  MPG CARDIAC ENZYMES Lab Results  Component Value Date   TROPONINI 0.47 (HH) 07/05/2018   TROPONINI 0.48 (HH) 07/04/2018   TROPONINI 0.50 (HH) 07/04/2018   BNP No results for input(s): PROBNP in the last 8760 hours. TSH No results for input(s): TSH in the last 8760 hours. CHOLESTEROL No results for input(s): CHOL in the last 8760 hours.  Scheduled Meds: . amiodarone  200 mg  Oral Daily  . amLODipine  5 mg Oral Daily  . carvedilol  25 mg Oral BID  . chlorhexidine  15 mL Mouth Rinse BID  . cloNIDine  0.2 mg Transdermal Q Sun  . dextromethorphan-guaiFENesin  2 tablet Oral BID  . etanercept  25 mg Subcutaneous Q Fri  . ezetimibe  10 mg Oral Daily  . hydrALAZINE  10 mg Oral Q8H  . insulin aspart  0-5 Units Subcutaneous QHS  . insulin aspart  0-9 Units Subcutaneous TID WC  . mouth rinse  15 mL Mouth Rinse q12n4p  . multivitamin with minerals  1 tablet Oral Daily  . pantoprazole  40 mg Oral BID  . polyethylene glycol  17 g Oral Daily  . potassium chloride  40 mEq Oral Once  . senna-docusate  2 tablet Oral BID   Continuous Infusions: PRN Meds:.acetaminophen, albuterol, hydrALAZINE, ipratropium-albuterol, ondansetron (ZOFRAN) IV  Assessment/Plan: Paroxysmal  atrial fibrillation Acute GI bleed Anemia of blood loss Abnormal troponin I from demand ischemia HTN Type 2 DM CKD, 2 Morbid obesity Rheumatoid arthritis  Resume amlodipine for BP control. F/U in 1 week.   LOS: 9 days    Dixie Dials  MD  07/13/2018, 10:09 AM

## 2018-07-13 NOTE — Progress Notes (Signed)
Physical Therapy Treatment Patient Details Name: Jade Mclean MRN: [id number] DOB: [birth year] Today's Date: 07/13/2018    History of Present Illness Pt adm to [facility] with acute GI bleed. Pt with recent PE and started blood thinners. Pt transfused 2 units of blood and transferred to [facility] for GI evaluation. Cardiac eval for elevated troponin and concluded abnormal troponin due to demand ischemia. PMH - recent PE, recent diagnosis chf, DM, HTN, CKD, morbid obesitiy.    PT Comments    Patient progressing slowly towards PT goals. Tolerated multiple standing bouts with Min A digressing to Mod A to power up. Reports left knee weakness worsened with increased activity. Tolerated SPT to chair with Min A for balance. Sp02 remained >90% on RA. Pt fatigues very quickly. Not able to tolerate ambulation today due to the need to stand multiple times for pericare. Will follow and progress as tolerated.    Follow Up Recommendations  Supervision/Assistance - 24 hour;SNF     Equipment Recommendations  None recommended by PT    Recommendations for Other Services       Precautions / Restrictions Precautions Precautions: Fall Restrictions Weight Bearing Restrictions: No    Mobility  Bed Mobility Overal bed mobility: Needs Assistance Bed Mobility: Supine to Sit     Supine to sit: Mod assist;HOB elevated     General bed mobility comments: Assist to elevate trunk into sitting and use of bed pad to scoot hips to edge of bed  Transfers Overall transfer level: Needs assistance Equipment used: Rolling walker (2 wheeled) Transfers: Sit to/from Stand Sit to Stand: Mod assist Stand pivot transfers: Min assist       General transfer comment: Multiple standing bouts from elevated bed height x6 for pericare, SPT bed to chair with Min A.   Ambulation/Gait             General Gait Details: Unable due to fatigue from standing bouts.   Stairs             Wheelchair  Mobility    Modified Rankin (Stroke Patients Only)       Balance Overall balance assessment: Needs assistance Sitting-balance support: No upper extremity supported;Feet supported Sitting balance-Leahy Scale: Fair     Standing balance support: During functional activity;Bilateral upper extremity supported Standing balance-Leahy Scale: Poor Standing balance comment: relies on UE support, standing bouts x4 for pericare; cannot stand longer than 1 min at a time.                            Cognition Arousal/Alertness: Awake/alert Behavior During Therapy: WFL for tasks assessed/performed Overall Cognitive Status: Within Functional Limits for tasks assessed                                        Exercises      General Comments General comments (skin integrity, edema, etc.): Sp02 remained >90% on RA during session.      Pertinent Vitals/Pain Pain Assessment: Faces Faces Pain Scale: Hurts little more Pain Location: left knee Pain Descriptors / Indicators: Guarding Pain Intervention(s): Limited activity within patient's tolerance;Monitored during session;Premedicated before session    Home Living                      Prior Function            PT Goals (  current goals can now be found in the care plan section) Progress towards PT goals: Progressing toward goals    Frequency    Min 2X/week      PT Plan Discharge plan needs to be updated    Co-evaluation              AM-PAC PT "6 Clicks" Mobility   Outcome Measure  Help needed turning from your back to your side while in a flat bed without using bedrails?: A Little Help needed moving from lying on your back to sitting on the side of a flat bed without using bedrails?: A Lot Help needed moving to and from a bed to a chair (including a wheelchair)?: A Little Help needed standing up from a chair using your arms (e.g., wheelchair or bedside chair)?: A Lot Help needed to walk  in hospital room?: Total Help needed climbing 3-5 steps with a railing? : Total 6 Click Score: 12    End of Session Equipment Utilized During Treatment: Gait belt Activity Tolerance: Patient limited by fatigue Patient left: in chair;with call bell/phone within reach;with chair alarm set Nurse Communication: Mobility status PT Visit Diagnosis: Other abnormalities of gait and mobility (R26.89);Muscle weakness (generalized) (M62.81)     Time: 1340-1405 PT Time Calculation (min) (ACUTE ONLY): 25 min  Charges:  $Therapeutic Activity: 23-37 mins                     Wray Kearns, [place], DPT Acute Rehabilitation Services Pager [phone] Office [phone]       [place] 07/13/2018, 2:21 PM

## 2018-07-14 DIAGNOSIS — N179 Acute kidney failure, unspecified: Secondary | ICD-10-CM

## 2018-07-14 LAB — CBC WITH DIFFERENTIAL/PLATELET
Abs Immature Granulocytes: 0.01 10*3/uL (ref 0.00–0.07)
Basophils Absolute: 0 10*3/uL (ref 0.0–0.1)
Basophils Relative: 1 %
Eosinophils Absolute: 0.4 10*3/uL (ref 0.0–0.5)
Eosinophils Relative: 17 %
HCT: 31.8 % — ABNORMAL LOW (ref 36.0–46.0)
Hemoglobin: 9.8 g/dL — ABNORMAL LOW (ref 12.0–15.0)
Immature Granulocytes: 0 %
Lymphocytes Relative: 18 %
Lymphs Abs: 0.4 10*3/uL — ABNORMAL LOW (ref 0.7–4.0)
MCH: 27.8 pg (ref 26.0–34.0)
MCHC: 30.8 g/dL (ref 30.0–36.0)
MCV: 90.3 fL (ref 80.0–100.0)
Monocytes Absolute: 0.4 10*3/uL (ref 0.1–1.0)
Monocytes Relative: 15 %
Neutro Abs: 1.2 10*3/uL — ABNORMAL LOW (ref 1.7–7.7)
Neutrophils Relative %: 49 %
Platelets: 172 10*3/uL (ref 150–400)
RBC: 3.52 MIL/uL — ABNORMAL LOW (ref 3.87–5.11)
RDW: 16.9 % — ABNORMAL HIGH (ref 11.5–15.5)
WBC: 2.4 10*3/uL — ABNORMAL LOW (ref 4.0–10.5)
nRBC: 0 % (ref 0.0–0.2)

## 2018-07-14 LAB — BASIC METABOLIC PANEL WITH GFR
Anion gap: 12 (ref 5–15)
BUN: 31 mg/dL — ABNORMAL HIGH (ref 8–23)
CO2: 21 mmol/L — ABNORMAL LOW (ref 22–32)
Calcium: 8.3 mg/dL — ABNORMAL LOW (ref 8.9–10.3)
Chloride: 106 mmol/L (ref 98–111)
Creatinine, Ser: 2.18 mg/dL — ABNORMAL HIGH (ref 0.44–1.00)
GFR calc Af Amer: 26 mL/min — ABNORMAL LOW
GFR calc non Af Amer: 22 mL/min — ABNORMAL LOW
Glucose, Bld: 80 mg/dL (ref 70–99)
Potassium: 3.5 mmol/L (ref 3.5–5.1)
Sodium: 139 mmol/L (ref 135–145)

## 2018-07-14 LAB — PHOSPHORUS: PHOSPHORUS: 2.3 mg/dL — AB (ref 2.5–4.6)

## 2018-07-14 LAB — GLUCOSE, CAPILLARY: Glucose-Capillary: 79 mg/dL (ref 70–99)

## 2018-07-14 MED ORDER — AMIODARONE HCL 200 MG PO TABS: 200.0000 mg | ORAL_TABLET | Freq: Every day | ORAL | Status: DC

## 2018-07-14 MED ORDER — POTASSIUM CHLORIDE ER 20 MEQ PO TBCR: 20.0000 meq | EXTENDED_RELEASE_TABLET | Freq: Every day | ORAL | Status: DC

## 2018-07-14 MED ORDER — AMLODIPINE BESYLATE 10 MG PO TABS: 10.0000 mg | ORAL_TABLET | Freq: Every day | ORAL | Status: AC

## 2018-07-14 MED ORDER — FUROSEMIDE 40 MG PO TABS: 40.0000 mg | ORAL_TABLET | Freq: Every day | ORAL | Status: DC

## 2018-07-14 MED ORDER — PANTOPRAZOLE SODIUM 40 MG PO TBEC: 40.0000 mg | DELAYED_RELEASE_TABLET | Freq: Two times a day (BID) | ORAL | Status: DC

## 2018-07-14 MED ORDER — AMLODIPINE BESYLATE 10 MG PO TABS
10.0000 mg | ORAL_TABLET | Freq: Every day | ORAL | Status: DC
  Administered 2018-07-14: 10 mg via ORAL
  Filled 2018-07-14: qty 1

## 2018-07-14 MED ORDER — CLONIDINE 0.2 MG/24HR TD PTWK: 0.2000 mg | MEDICATED_PATCH | TRANSDERMAL | 12 refills | Status: DC

## 2018-07-14 NOTE — Discharge Instructions (Signed)
Follow with Primary MD Patient, No Pcp Per in 7 days   Get CBC, CMP, 2 view Chest X ray checked  by  SNF MD in 3 days    Activity: As tolerated with Full fall precautions use walker/cane & assistance as needed  Disposition SNF  Diet: Heart Healthy with 1.5 L/day total fluid restriction.  Special Instructions: If you have smoked or chewed Tobacco  in the last 2 yrs please stop smoking, stop any regular Alcohol  and or any Recreational drug use.  On your next visit with your primary care physician please Get Medicines reviewed and adjusted.  Please request your Prim.MD to go over all Hospital Tests and Procedure/Radiological results at the follow up, please get all Hospital records sent to your Prim MD by signing hospital release before you go home.  If you experience worsening of your admission symptoms, develop shortness of breath, life threatening emergency, suicidal or homicidal thoughts you must seek medical attention immediately by calling 911 or calling your MD immediately  if symptoms less severe.  You Must read complete instructions/literature along with all the possible adverse reactions/side effects for all the Medicines you take and that have been prescribed to you. Take any new Medicines after you have completely understood and accpet all the possible adverse reactions/side effects.

## 2018-07-14 NOTE — Discharge Summary (Signed)
Jade Mclean NTI:[id number] DOB: [birth year] DOA: 07/04/2018  PCP: Patient, No Pcp Per  Admit date: 07/04/2018  Discharge date: 07/14/2018  Admitted From: SNF   Disposition:  SNF   Recommendations for Outpatient Follow-up:   Follow up with PCP in 1-2 weeks  PCP Please obtain BMP/CBC, 2 view CXR in 1week,  (see Discharge instructions)   PCP Please follow up on the following pending results:    Home Health: None   Equipment/Devices: None  Consultations: Renal, Cards Discharge Condition: Stable   CODE STATUS: DNR   Diet Recommendation: Heart Healthy low carbohydrate with 1.5 L/day .   CC - Transfer for GI bleed   Brief history of present illness from the day of admission and additional interim summary    Jade Mclean a 73 y.o.femalewithpastmedical history significant forrecently diagnosed pulmonary embolismon Eliquis  VQ scan done this admission unremarkable with low probability for PE, per radiology prior positive result was likely an artifact, chronic diastolic CHF, CKD 4, type 2 diabetes, hypertension, [place]reme fatigue and somnolence as noted by family members. [facility] ED was found to have a hemoglobin of 5 and a positive FOBT. No GI services at [facility] at that time therefore the patient was transferred to [facility] for possible GI evaluation. Patient received 4 units of PRBCs.  EGD done on 07/05/2018 by Dr. Collene Mares with findings of multiple large superficial duodenal ulcers.  Hospital stay was complicated by A. fib RVR, acute on chronic diastolic CHF and ARF on CKD 4.                                                                 Hospital Course    Upper GI bleed due to duodenal ulcer acute upper GI bleed related anemia.   EGD done by Dr. Collene Mares positive for duodenal ulcers, Eliquis was held, initially treated with IV PPI and received 4 units of packed RBCs, now H&H stable no signs of bleeding, discharged on twice daily PPI with outpatient GI follow-up and PCP follow-up.  No further anticoagulation.  Outpatient diagnosis of PE.  Per radiologist likely was false positive test, VQ scan this admission negative.  Paroxysmal A. fib with RVR.  Seen by cardiology, [place] vas 2 score of 4.  No anticoagulation aspirin due to upper GI bleed with active ulcers, currently on combination of Coreg and amiodarone, follow with cardiology in a week to evaluate low-dose aspirin addition if possible.  Essential hypertension.  Medications adjusted currently see regimen below.  Monitor and adjust as needed.  ARF on CKD 4.  Baseline creatinine is around 2.3.  Seen by allergy, renal function back to baseline.  Gentle diuresis started.  Monitor renal function closely at SNF.  Pneumonia present on admission.  Adequately treated.  Acute on chronic diastolic CHF, EF 46%.  Diuresis commenced, oxygen saturation improving, continue supplemental oxygen as needed.  History of rheumatoid arthritis, General deconditioning and ambulatory dysfunction.  Seen by PT will require SNF.  Non-ACS pattern elevated troponin likely due to combination of severe anemia and RVR.  Seen by cardiology, on beta-blocker for secondary prevention, not ACS, outpatient cardiology follow-up.  Chest pain-free.    Discharge diagnosis     Active Problems:   GI bleeding   Acute blood loss anemia    Discharge instructions    Discharge Instructions    Discharge instructions   Complete by:  As directed    Follow with Primary MD Patient, No Pcp Per in 7 days   Get CBC, CMP, 2 view Chest X ray checked  by  SNF MD in 3 days    Activity: As tolerated with Full fall precautions use walker/cane & assistance as needed  Disposition SNF  Diet: Heart Healthy with 1.5  L/day total fluid restriction.  Special Instructions: If you have smoked or chewed Tobacco  in the last 2 yrs please stop smoking, stop any regular Alcohol  and or any Recreational drug use.  On your next visit with your primary care physician please Get Medicines reviewed and adjusted.  Please request your Prim.MD to go over all Hospital Tests and Procedure/Radiological results at the follow up, please get all Hospital records sent to your Prim MD by signing hospital release before you go home.  If you experience worsening of your admission symptoms, develop shortness of breath, life threatening emergency, suicidal or homicidal thoughts you must seek medical attention immediately by calling 911 or calling your MD immediately  if symptoms less severe.  You Must read complete instructions/literature along with all the possible adverse reactions/side effects for all the Medicines you take and that have been prescribed to you. Take any new Medicines after you have completely understood and accpet all the possible adverse reactions/side effects.   Increase activity slowly   Complete by:  As directed       Discharge Medications   Allergies as of 07/14/2018      Reactions   Colesevelam    Other reaction(s): GI Upset (intolerance)   Dulaglutide    Other reaction(s): GI Upset (intolerance)   Rosuvastatin    Other reaction(s): Myalgias (intolerance)   Simvastatin    Other reaction(s): Myalgias (intolerance)      Medication List    STOP taking these medications   amoxicillin-clavulanate 500-125 MG tablet Commonly known as:  AUGMENTIN   apixaban 5 MG Tabs tablet Commonly known as:  ELIQUIS   cloNIDine 0.2 MG tablet Commonly known as:  CATAPRES   irbesartan 300 MG tablet Commonly known as:  AVAPRO   NIFEdipine 60 MG 24 hr tablet Commonly known as:  PROCARDIA XL/NIFEDICAL XL     TAKE these medications   amiodarone 200 MG tablet Commonly known as:  PACERONE Take 1 tablet (200 mg  total) by mouth daily.   amLODipine 10 MG tablet Commonly known as:  NORVASC Take 1 tablet (10 mg total) by mouth daily.   Calcium Carbonate-Vitamin D 600-400 MG-UNIT chew tablet Chew 1 tablet by mouth daily.   carvedilol 25 MG tablet Commonly known as:  COREG Take 25 mg by mouth 2 (two) times daily.   cloNIDine 0.2 mg/24hr patch Commonly known as:  CATAPRES - Dosed in mg/24 hr Place 1 patch (0.2 mg total) onto the skin every Sunday. Start taking on:  July 19, 2018   ENBREL 25 MG injection Generic drug:  etanercept Inject 25 mg into the skin every Friday.   ezetimibe 10 MG tablet Commonly known as:  ZETIA Take 10 mg by mouth daily.   furosemide 40 MG tablet Commonly known as:  LASIX Take 1 tablet (40 mg total) by mouth daily.   insulin degludec 100 UNIT/ML Sopn FlexTouch Pen Commonly known as:  TRESIBA Inject 12 Units into the skin daily.   multivitamin with minerals Tabs tablet Take 1 tablet by mouth daily.   pantoprazole 40 MG tablet Commonly known as:  PROTONIX Take 1 tablet (40 mg total) by mouth 2 (two) times daily.   Potassium Chloride ER 20 MEQ Tbcr Take 20 mEq by mouth daily.        Contact information for follow-up providers    Dixie Dials, MD. Schedule an appointment as soon as possible for a visit in 1 week(s).   Specialty:  Cardiology Contact information: [place] [address] [phone]            Contact information for after-discharge care    Destination    [facility] Preferred SNF .   Service:  Skilled Nursing Contact information: [address] [place] [phone]                  Major procedures and Radiology Reports - PLEASE review detailed and final reports thoroughly  -         US Renal  Result Date: 07/11/2018 CLINICAL DATA:  Acute kidney injury EXAM: RENAL / URINARY TRACT ULTRASOUND COMPLETE COMPARISON:  None. FINDINGS: Right Kidney:  Renal measurements: 8.9 x 3.8 x 4.9 cm = volume: 85 mL . Echogenicity within normal limits. No mass or hydronephrosis visualized. Left Kidney: Renal measurements: 12.3 x 7.9 x 8.1 cm = volume: 411 mL. Echogenicity within normal limits. No mass or hydronephrosis visualized. Mild cortical thinning. Bladder: Appears normal for degree of bladder distention. IMPRESSION: Left kidney is significantly larger than that of the right kidney. This may be due to technical factors. Chronic right renal artery stenosis can give a similar appearance. No hydronephrosis or evidence of acute urinary pathology. Electronically Signed   By: Marybelle Killings M.D.   On: 07/11/2018 11:02   Dg Chest Port 1 View  Result Date: 07/05/2018 CLINICAL DATA:  Acute respiratory distress EXAM: PORTABLE CHEST 1 VIEW COMPARISON:  Chest radiograph from one day prior. FINDINGS: Stable cardiomediastinal silhouette with mild cardiomegaly. No pneumothorax. No right pleural effusion. Stable blunting of the left costophrenic angle. Mild-to-moderate pulmonary appears slightly worsened. IMPRESSION: Mild-to-moderate congestive heart failure, slightly worsened. Stable small left pleural effusion. Electronically Signed   By: Ilona Sorrel M.D.   On: 07/05/2018 22:34   Dg Chest Port 1 View  Result Date: 07/04/2018 CLINICAL DATA:  Chest pain EXAM: PORTABLE CHEST 1 VIEW COMPARISON:  06/26/2018 FINDINGS: The patient is tilted to the left. Obscuration of the left hemidiaphragm and portions of the left heart border from presumed left pleural effusion is noted. Probable atelectasis at the left lung base as a result. Mild vascular congestion is seen. Aortic atherosclerosis without aneurysm is identified. IMPRESSION: Obscuration of the left hemidiaphragm and portions of the left heart border from presumed small to moderate left pleural effusion. Probable atelectasis at the left lung base. Mild vascular congestion. Electronically Signed   By: Ashley Royalty M.D.   On:  07/04/2018 22:46   Vas [place] Lower Extremity Venous (dvt)  Result Date: 07/06/2018  Lower Venous Study Indications: Pain.  Performing Technologist: June Leap RDMS, RVT  Examination Guidelines: A complete evaluation includes B-mode imaging, spectral Doppler, color Doppler, and power Doppler as needed of all accessible portions of each vessel. Bilateral testing is considered an integral part of a complete examination. Limited examinations for reoccurring indications may be performed as noted.  Right Venous Findings: +---------+---------------+---------+-----------+----------+--------------+          CompressibilityPhasicitySpontaneityPropertiesSummary        +---------+---------------+---------+-----------+----------+--------------+ CFV      Full           Yes      Yes                                 +---------+---------------+---------+-----------+----------+--------------+ SFJ      Full                                                        +---------+---------------+---------+-----------+----------+--------------+ FV Prox  Full                                                        +---------+---------------+---------+-----------+----------+--------------+ FV Mid   Full                                                        +---------+---------------+---------+-----------+----------+--------------+ FV Distal                                             Not visualized +---------+---------------+---------+-----------+----------+--------------+ PFV      Full                                                        +---------+---------------+---------+-----------+----------+--------------+ POP      Full           Yes      Yes                                 +---------+---------------+---------+-----------+----------+--------------+ PTV      Full                                                         +---------+---------------+---------+-----------+----------+--------------+ PERO     Full                                                        +---------+---------------+---------+-----------+----------+--------------+  Left Venous Findings: +---------+---------------+---------+-----------+----------+--------------+          CompressibilityPhasicitySpontaneityPropertiesSummary        +---------+---------------+---------+-----------+----------+--------------+ CFV                                                   Not visualized +---------+---------------+---------+-----------+----------+--------------+ FV Prox  Full           Yes      Yes                                 +---------+---------------+---------+-----------+----------+--------------+ FV Mid   Full                                                        +---------+---------------+---------+-----------+----------+--------------+ FV DistalFull                                                        +---------+---------------+---------+-----------+----------+--------------+ PFV      Full                                                        +---------+---------------+---------+-----------+----------+--------------+ POP      Full           Yes      Yes                                 +---------+---------------+---------+-----------+----------+--------------+ PTV      Full                                                        +---------+---------------+---------+-----------+----------+--------------+ PERO     Full                                                        +---------+---------------+---------+-----------+----------+--------------+    Summary: Right: There is no evidence of deep vein thrombosis in the lower extremity. Left: There is no evidence of deep vein thrombosis in the lower extremity.  *See table(s) above for measurements and observations. Electronically signed by  Deitra Mayo MD on 07/06/2018 at 5:11:02 PM.    Final     Micro Results     Recent Results (from the past 240 hour(s))  MRSA PCR Screening     Status: None   Collection Time: 07/04/18 11:57 PM  Result Value Ref Range Status   MRSA by PCR NEGATIVE NEGATIVE Final    Comment:  The GeneXpert MRSA Assay (FDA approved for NASAL specimens only), is one component of a comprehensive MRSA colonization surveillance program. It is not intended to diagnose MRSA infection nor to guide or monitor treatment for MRSA infections. Performed at [facility], [place] [address], [place], [place] [address]     Today   Subjective    Quadasia Newsham today has no headache,no chest abdominal pain,no new weakness tingling or numbness, feels much better wants to go home today.     Objective   Blood pressure 170/50, pulse 67, temperature 98.3 F (36.8 C), temperature source Oral, resp. rate 16, height 5\' 5"  (1.651 m), weight 99.7 kg, SpO2 99 %.   Intake/Output Summary (Last 24 hours) at 07/14/2018 0918 Last data filed at 07/14/2018 0000 Gross per 24 hour  Intake 120 ml  Output 500 ml  Net -380 ml    Exam  Awake Alert, Oriented x 3, No new F.N deficits, Normal affect [place].AT,PERRAL Supple Neck,No JVD, No cervical lymphadenopathy appriciated.  Symmetrical Chest wall movement, Good air movement bilaterally, CTAB RRR,No Gallops,Rubs or new Murmurs, No Parasternal Heave +ve B.Sounds, Abd Soft, Non tender, No organomegaly appriciated, No rebound -guarding or rigidity. No Cyanosis, Clubbing or edema, No new Rash or bruise   Data Review   CBC w Diff:  Lab Results  Component Value Date   WBC 2.4 (L) 07/14/2018   HGB 9.8 (L) 07/14/2018   HCT 31.8 (L) 07/14/2018   PLT 172 07/14/2018   LYMPHOPCT 18 07/14/2018   MONOPCT 15 07/14/2018   EOSPCT 17 07/14/2018   BASOPCT 1 07/14/2018    CMP:  Lab Results  Component Value Date   NA 139 07/14/2018   K 3.5 07/14/2018   CL 106  07/14/2018   CO2 21 (L) 07/14/2018   BUN 31 (H) 07/14/2018   CREATININE 2.18 (H) 07/14/2018   PROT 5.9 (L) 07/04/2018   ALBUMIN 2.6 (L) 07/04/2018   BILITOT 1.6 (H) 07/04/2018   ALKPHOS 33 (L) 07/04/2018   AST 30 07/04/2018   ALT 19 07/04/2018  .   Total Time in preparing paper work, data evaluation and todays exam - 69 minutes  Lala Lund M.D on 07/14/2018 at [place]  [phone]

## 2018-07-14 NOTE — Consult Note (Signed)
Ref: Patient, No Pcp Per   Subjective:  Monitor shows sinus rhythm. Afebrile. Heart rate in 60's to 70's.  Objective:  Vital Signs in the last 24 hours: Temp:  [98.2 F (36.8 C)-98.9 F (37.2 C)] 98.3 F (36.8 C) (01/07 0807) Pulse Rate:  [67-72] 67 (01/07 0807) Cardiac Rhythm: Normal sinus rhythm (01/07 0700) Resp:  [13-26] 16 (01/07 0807) BP: (156-187)/(43-59) 177/47 (01/07 0939) SpO2:  [94 %-99 %] 99 % (01/07 0807) Weight:  [99.7 kg] 99.7 kg (01/07 0617)  Physical Exam: BP Readings from Last 1 Encounters:  07/14/18 (!) 177/47     Wt Readings from Last 1 Encounters:  07/14/18 99.7 kg    Weight change:  Body mass index is 36.58 kg/m. HEENT: [place]/AT, Eyes-Brown, PERL, EOMI, Conjunctiva-Pale pink, Sclera-Non-icteric Neck: No JVD, No bruit, Trachea midline. Lungs:  Clearing, Bilateral. Cardiac:  Regular rhythm, normal S1 and S2, no S3. II/VI systolic murmur. Abdomen:  Soft, non-tender. BS present. Extremities:  No edema present. No cyanosis. No clubbing. CNS: AxOx3, Cranial nerves grossly intact, moves all 4 extremities.  Skin: Warm and dry.   Intake/Output from previous day: 01/06 0701 - 01/07 0700 In: 120 [P.O.:120] Out: 500 [Urine:500]    Lab Results: BMET    Component Value Date/Time   NA 139 07/14/2018 0434   NA 136 07/13/2018 0707   NA 134 (L) 07/12/2018 0635   K 3.5 07/14/2018 0434   K 3.4 (L) 07/13/2018 0707   K 3.9 07/12/2018 0635   CL 106 07/14/2018 0434   CL 107 07/13/2018 0707   CL 102 07/12/2018 0635   CO2 21 (L) 07/14/2018 0434   CO2 21 (L) 07/13/2018 0707   CO2 21 (L) 07/12/2018 0635   GLUCOSE 80 07/14/2018 0434   GLUCOSE 110 (H) 07/13/2018 0707   GLUCOSE 122 (H) 07/12/2018 0635   BUN 31 (H) 07/14/2018 0434   BUN 40 (H) 07/13/2018 0707   BUN 51 (H) 07/12/2018 0635   CREATININE 2.18 (H) 07/14/2018 0434   CREATININE 2.46 (H) 07/13/2018 0707   CREATININE 2.91 (H) 07/12/2018 0635   CALCIUM 8.3 (L) 07/14/2018 0434   CALCIUM 8.5 (L)  07/13/2018 0707   CALCIUM 8.1 (L) 07/12/2018 0635   GFRNONAA 22 (L) 07/14/2018 0434   GFRNONAA 19 (L) 07/13/2018 0707   GFRNONAA 16 (L) 07/12/2018 0635   GFRAA 26 (L) 07/14/2018 0434   GFRAA 22 (L) 07/13/2018 0707   GFRAA 18 (L) 07/12/2018 0635   CBC    Component Value Date/Time   WBC 2.4 (L) 07/14/2018 0434   RBC 3.52 (L) 07/14/2018 0434   HGB 9.8 (L) 07/14/2018 0434   HCT 31.8 (L) 07/14/2018 0434   PLT 172 07/14/2018 0434   MCV 90.3 07/14/2018 0434   MCH 27.8 07/14/2018 0434   MCHC 30.8 07/14/2018 0434   RDW 16.9 (H) 07/14/2018 0434   LYMPHSABS 0.4 (L) 07/14/2018 0434   MONOABS 0.4 07/14/2018 0434   EOSABS 0.4 07/14/2018 0434   BASOSABS 0.0 07/14/2018 0434   HEPATIC Function Panel Recent Labs    07/04/18 1102  PROT 5.9*   HEMOGLOBIN A1C No components found for: HGA1C,  MPG CARDIAC ENZYMES Lab Results  Component Value Date   TROPONINI 0.47 (HH) 07/05/2018   TROPONINI 0.48 (HH) 07/04/2018   TROPONINI 0.50 (HH) 07/04/2018   BNP No results for input(s): PROBNP in the last 8760 hours. TSH No results for input(s): TSH in the last 8760 hours. CHOLESTEROL No results for input(s): CHOL in the last 8760 hours.  Scheduled Meds: . amiodarone  200 mg Oral Daily  . amLODipine  10 mg Oral Daily  . carvedilol  25 mg Oral BID  . chlorhexidine  15 mL Mouth Rinse BID  . cloNIDine  0.2 mg Transdermal Q Sun  . dextromethorphan-guaiFENesin  2 tablet Oral BID  . etanercept  25 mg Subcutaneous Q Fri  . ezetimibe  10 mg Oral Daily  . furosemide  20 mg Oral Daily  . hydrALAZINE  10 mg Oral Q8H  . insulin aspart  0-5 Units Subcutaneous QHS  . insulin aspart  0-9 Units Subcutaneous TID WC  . mouth rinse  15 mL Mouth Rinse q12n4p  . multivitamin with minerals  1 tablet Oral Daily  . pantoprazole  40 mg Oral BID  . polyethylene glycol  17 g Oral Daily  . senna-docusate  2 tablet Oral BID   Continuous Infusions: PRN Meds:.acetaminophen, albuterol, hydrALAZINE,  ipratropium-albuterol, ondansetron (ZOFRAN) IV  Assessment/Plan: Paroxysmal atrial fibrillation Acute GI bleed Anemia of blood loss HTN Type 2 DM CKD, 2 Morbid obesity Rheumatoid arthritis Abnormal troponin I from demand ischemia  Continue medications F/U 1 week.   LOS: 10 days    Dixie Dials  MD  07/14/2018, 10:10 AM

## 2018-07-14 NOTE — Social Work (Signed)
Patient will discharge to [facility] Anticipated discharge date: 07/14/18 Family notified: Gordy Councilman, daughter Transportation by: [facility]  Nurse to call report to [phone].  CSW signing off.  Estanislado Emms, [place]  Clinical Social Worker

## 2018-07-14 NOTE — Clinical Social Work Placement (Signed)
   CLINICAL SOCIAL WORK PLACEMENT  NOTE  Date:  07/14/2018  Patient Details  Name: Jade Mclean MRN: [id number] Date of Birth: 1946-06-20  Clinical Social Work is seeking post-discharge placement for this patient at the [place] level of care (*CSW will initial, date and re-position this form in  chart as items are completed):  Yes   Patient/family provided with [facility] Work Department's list of facilities offering this level of care within the geographic area requested by the patient (or if unable, by the patient's family).  Yes   Patient/family informed of their freedom to choose among providers that offer the needed level of care, that participate in Medicare, Medicaid or managed care program needed by the patient, have an available bed and are willing to accept the patient.  Yes   Patient/family informed of [facility]'s ownership interest in [facility] and [facility], as well as of the fact that they are under no obligation to receive care at these facilities.  PASRR submitted to EDS on       PASRR number received on       Existing PASRR number confirmed on 07/06/18     FL2 transmitted to all facilities in geographic area requested by pt/family on 07/06/18     FL2 transmitted to all facilities within larger geographic area on       Patient informed that his/her managed care company has contracts with or will negotiate with certain facilities, including the following:  [facility]     Yes   Patient/family informed of bed offers received.  Patient chooses bed at [facility]     Physician recommends and patient chooses bed at      Patient to be transferred to [facility] on 07/14/18.  Patient to be transferred to facility by [facility]     Patient family notified on 07/14/18 of transfer.  Name of family member notified:  Gordy Councilman, daughter     PHYSICIAN Please sign FL2      Additional Comment:    _______________________________________________ Estanislado Emms, LCSW 07/14/2018, 9:56 AM

## 2018-07-14 NOTE — Progress Notes (Signed)
PTAR received patient's discharge information. RN called report to [facility]. Patient IV was removed.

## 2018-07-14 NOTE — NC FL2 (Signed)
MEDICAID FL2 LEVEL OF CARE SCREENING TOOL     IDENTIFICATION  Patient Name: Jade Mclean Birthdate: 10-26-1945 Sex: adult Admission Date (Current Location): 07/04/2018  [facility] and [place] Number:  Herbalist and Address:  [facility], [place] [address] [place], [place] [address]      Provider Number: [id number]  Attending Physician Name and Address:  Thurnell Lose, MD  Relative Name and Phone Number:  Gordy Councilman [phone]    Current Level of Care: Hospital Recommended Level of Care: [place] Prior Approval Number:    Date Approved/Denied:   PASRR Number: [id number]  Discharge Plan: SNF    Current Diagnoses: Patient Active Problem List   Diagnosis Date Noted  . Acute blood loss anemia   . GI bleeding 07/05/2018    Orientation RESPIRATION BLADDER Height & Weight     Self, Time, Situation, Place  O2(nasal cannula 4L) Continent Weight: 99.7 kg Height:  5\' 5"  (165.1 cm)  BEHAVIORAL SYMPTOMS/MOOD NEUROLOGICAL BOWEL NUTRITION STATUS      Continent Diet(please see DC summary)  AMBULATORY STATUS COMMUNICATION OF NEEDS Skin   Limited Assist Verbally Normal                       Personal Care Assistance Level of Assistance  Bathing, Feeding, Dressing Bathing Assistance: Limited assistance Feeding assistance: Independent Dressing Assistance: Limited assistance     Functional Limitations Info  Sight, Hearing, Speech Sight Info: Adequate Hearing Info: Adequate Speech Info: Adequate    SPECIAL CARE FACTORS FREQUENCY  PT (By licensed PT)     PT Frequency: 5x/week              Contractures Contractures Info: Not present    Additional Factors Info  Code Status, Allergies, Insulin Sliding Scale Code Status Info: DNR Allergies Info: Colesevelam, Dulaglutide, Rosuvastatin, Simvastatin   Insulin Sliding Scale Info: novolog 3x/day with meals and at bedtime       Current  Medications (07/14/2018):  This is the current hospital active medication list Current Facility-Administered Medications  Medication Dose Route Frequency Provider Last Rate Last Dose  . acetaminophen (TYLENOL) tablet 650 mg  650 mg Oral Q6H PRN Irene Pap N, DO      . albuterol (PROVENTIL) (2.5 MG/3ML) 0.083% nebulizer solution 2.5 mg  2.5 mg Nebulization Once PRN Irene Pap N, DO      . amiodarone (PACERONE) tablet 200 mg  200 mg Oral Daily Charolette Forward, MD   200 mg at 07/13/18 1106  . amLODipine (NORVASC) tablet 10 mg  10 mg Oral Daily Thurnell Lose, MD      . carvedilol (COREG) tablet 25 mg  25 mg Oral BID Charolette Forward, MD   25 mg at 07/14/18 0005  . chlorhexidine (PERIDEX) 0.12 % solution 15 mL  15 mL Mouth Rinse BID Irene Pap N, DO   15 mL at 07/14/18 0007  . cloNIDine (CATAPRES - Dosed in mg/24 hr) patch 0.2 mg  0.2 mg Transdermal Q Danice Goltz, Scarlette Shorts T, NP   0.2 mg at 07/12/18 0851  . dextromethorphan-guaiFENesin (MUCINEX DM) 30-600 MG per 12 hr tablet 2 tablet  2 tablet Oral BID Irene Pap N, DO   2 tablet at 07/13/18 1106  . etanercept (ENBREL) 25 MG injection SOLR 25 mg  25 mg Subcutaneous Q Fri Hall, [place] N, DO   25 mg at 07/10/18 1705  . ezetimibe (ZETIA) tablet 10 mg  10  mg Oral Daily Irene Pap N, DO   10 mg at 07/13/18 1106  . furosemide (LASIX) tablet 20 mg  20 mg Oral Daily Finnigan, Nancy A, DO   20 mg at 07/13/18 1445  . hydrALAZINE (APRESOLINE) injection 10 mg  10 mg Intravenous Q6H PRN Irene Pap N, DO      . hydrALAZINE (APRESOLINE) tablet 10 mg  10 mg Oral Q8H Hall, Carole N, DO   10 mg at 07/14/18 0006  . insulin aspart (novoLOG) injection 0-5 Units  0-5 Units Subcutaneous QHS Kayleen Memos, DO   2 Units at 07/07/18 2247  . insulin aspart (novoLOG) injection 0-9 Units  0-9 Units Subcutaneous TID WC Irene Pap N, DO   1 Units at 07/12/18 1211  . ipratropium-albuterol (DUONEB) 0.5-2.5 (3) MG/3ML nebulizer solution 3 mL  3 mL Nebulization Q6H PRN Irene Pap N, DO      . MEDLINE mouth rinse  15 mL Mouth Rinse q12n4p Hall, Carole N, DO   15 mL at 07/13/18 1625  . multivitamin with minerals tablet 1 tablet  1 tablet Oral Daily Irene Pap N, DO   1 tablet at 07/13/18 1106  . ondansetron (ZOFRAN) injection 4 mg  4 mg Intravenous Q6H PRN Irene Pap N, DO      . pantoprazole (PROTONIX) EC tablet 40 mg  40 mg Oral BID Irene Pap N, DO   40 mg at 07/14/18 0005  . polyethylene glycol (MIRALAX / GLYCOLAX) packet 17 g  17 g Oral Daily Irene Pap N, DO   17 g at 07/12/18 0850  . senna-docusate (Senokot-S) tablet 2 tablet  2 tablet Oral BID Kayleen Memos, DO   2 tablet at 07/13/18 1105     Discharge Medications: Please see discharge summary for a list of discharge medications.  Relevant Imaging Results:  Relevant Lab Results:   Additional Information SSN: [id number]  Estanislado Emms, LCSW

## 2018-07-15 LAB — PARATHYROID HORMONE, INTACT (NO CA): PTH: 19 pg/mL (ref 15–65)

## 2018-08-27 NOTE — Progress Notes (Deleted)
Cardiology Office Note:    Date:  08/27/2018   ID:  Jade Mclean, DOB 1945/08/19, MRN [id number]  PCP:  Algis Greenhouse, MD  Cardiologist:  Shirlee More, MD   Referring MD: Algis Greenhouse, MD  ASSESSMENT:    No diagnosis found. PLAN:    In order of problems listed above:  1. ***  Next appointment   Medication Adjustments/Labs and Tests Ordered: Current medicines are reviewed at length with the patient today.  Concerns regarding medicines are outlined above.  No orders of the defined types were placed in this encounter.  No orders of the defined types were placed in this encounter.    No chief complaint on file. ***  History of Present Illness:    Jade Mclean is a 73 y.o. adult who is being seen today for the evaluation of atrial fibrillation at the request of Dough, Jaymes Graff, MD.  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 Acute blood loss anemia   . Benign hypertension 08/16/2015  . CHF (congestive heart failure) ([place])   . Chronic kidney disease, stage 3 ([place]) 02/20/2016  . Chronic rheumatic arthritis ([place]) 08/16/2015  . Fatigue 05/22/2018  . GI bleeding 07/05/2018  . Mixed hyperlipidemia 08/16/2015  . Obesity (BMI 30-39.9) 09/18/2017  . Osteoporosis 02/20/2016   2010: -2.1 2014: -1.8 2018: no fracture, no treatment 2019: -1.2  . Type 2 diabetes mellitus without complication, without long-term current use of insulin ([place]) 08/16/2015    Past Surgical History:  Procedure Laterality Date  . ESOPHAGOGASTRODUODENOSCOPY (EGD) WITH PROPOFOL N/A 07/05/2018   Procedure: ESOPHAGOGASTRODUODENOSCOPY (EGD) WITH PROPOFOL;  Surgeon: Juanita Craver, MD;  Location[facility] ENDOSCOPY;  Service: Endoscopy;  Laterality: N/A;  . KNEE SURGERY Right     Current Medications: No outpatient medications have been marked as taking for the 08/28/18  encounter (Appointment) with Richardo Priest, MD.     Allergies:   Colesevelam; Dulaglutide; Rosuvastatin; and Simvastatin   Social History   Socioeconomic History  . Marital status: Single    Spouse name: Not on file  . Number of children: Not on file  . Years of education: Not on file  . Highest education level: Not on file  Occupational History  . Not on file  Social Needs  . Financial resource strain: Not on file  . Food insecurity:    Worry: Not on file    Inability: Not on file  . Transportation needs:    Medical: Not on file    Non-medical: Not on file  Tobacco Use  . Smoking status: Never Smoker  . Smokeless tobacco: Never Used  Substance and Sexual Activity  . Alcohol use: Never    Frequency: Never  . Drug use: Never  . Sexual activity: Not on file  Lifestyle  . Physical activity:    Days per week: Not on file    Minutes per session: Not on file  . Stress: Not on file  Relationships  . Social connections:    Talks on phone: Not on file    Gets together: Not on file    Attends religious service: Not on file    Active member of club or organization: Not on file    Attends meetings of clubs or organizations: Not on file    Relationship status: Not on file  Other Topics Concern  . Not on file  Social History Narrative  . Not on file     Family History: The patient's ***family history includes Diabetes in his mother and sister; Heart attack in his brother; Tuberculosis in his father.  ROS:   ROS Please see the history of present illness.    *** All other systems reviewed and are negative.  EKGs/Labs/Other Studies Reviewed:    The following studies were reviewed today: ***  EKG:  EKG is *** ordered today.  The ekg ordered today demonstrates ***  Results for orders placed or performed in visit on 08/07/18  Comprehensive Metabolic Panel  Result Value Ref Range  Sodium 140 135 - 146 MMOL/L  Potassium 4.4 3.5 - 5.3 MMOL/L  Chloride 104 98 - 110  MMOL/L  CO2 28 23 - 30 MMOL/L  BUN 27 (H) 8 - 24 MG/DL  Glucose 160 (H) 70 - 99 MG/DL  Creatinine 2.40 (H) 0.50 - 1.50 MG/DL  Calcium 9.3 8.5 - 10.5 MG/DL  Total Protein 8.1 6.0 - 8.3 G/DL  Albumin 3.9 3.5 - 5.0 G/DL  Total Bilirubin 0.6 0.1 - 1.2 MG/DL  Alkaline Phosphatase 53 25 - 125 IU/L or U/L  AST (SGOT) 23 5 - 40 IU/L or U/L  ALT (SGPT) 13 5 - 50 IU/L or U/L  Anion Gap 8 4 - 14 MMOL/L  Est. GFR African American 23 (L) >=60 ML/MIN/1.73 M*2  CBC and Differential  Result Value Ref Range  WBC 3.9 (L) 4.8 - 10.8 x 10*3/uL  RBC 4.41 4.20 - 5.40 x 10*6/uL  Hemoglobin 12.8 12.0 - 16.0 G/DL  Hematocrit 38.8 37.0 - 47.0 %  MCV 88.0 81.0 - 99.0 FL  MCH 29.0 27.0 - 31.0 PG  MCHC 32.9 (L) 33.0 - 37.0 G/DL  RDW 16.8 (H) 11.5 - 14.5 %  Platelets 191 160 - 360 X 10*3/uL  MPV 9.3 6.8 - 10.2 FL  Neutrophil % 55 %  Lymphocyte % 25 %  Monocyte % 14 %  Eosinophil % 6 %  Basophil % 1 %  Neutrophil Absolute 2.1 1.6 - 7.3 x 10*3/uL  Lymphocyte Absolute 1.0 1.0 - 5.1 x 10*3/uL  Monocyte Absolute 0.5 0.1 - 0.9 x 10*3/uL  Eosinophil Absolute 0.2 0.0 - 0.5 x 10*3/uL  Basophil Absolute 0.1 0.0 - 0.2 x 10*3/uL  NRBC Manual 0 <=0 / 100 WBC    Recent Labs: 07/04/2018: ALT 19 07/14/2018: BUN 31; Creatinine, Ser 2.18; Hemoglobin 9.8; Platelets 172; Potassium 3.5; Sodium 139  Recent Lipid Panel No results found for: CHOL, TRIG, HDL, CHOLHDL, VLDL, LDLCALC, LDLDIRECT  Physical Exam:    VS:  There were no vitals taken for this visit.    Wt Readings from Last 3 Encounters:  07/14/18 219 lb 12.8 oz (99.7 kg)     GEN: *** Well nourished, well developed in no acute distress HEENT: Normal NECK: No JVD; No carotid bruits LYMPHATICS: No lymphadenopathy CARDIAC: ***RRR, no murmurs, rubs, gallops RESPIRATORY:  Clear to auscultation without rales, wheezing or rhonchi  ABDOMEN: Soft, non-tender, non-distended MUSCULOSKELETAL:  No edema; No deformity  SKIN: Warm and dry NEUROLOGIC:  Alert and  oriented x 3 PSYCHIATRIC:  Normal affect     Signed, Shirlee More, MD  08/27/2018 2:48 PM    [facility]

## 2018-08-28 ENCOUNTER — Ambulatory Visit: Payer: Medicare Other | Admitting: Cardiology

## 2018-09-14 NOTE — Progress Notes (Signed)
Cardiology Office Note:    Date:  09/15/2018   ID:  Jade Mclean, DOB March 15, 1946, MRN [id number]  PCP:  Algis Greenhouse, MD  Cardiologist:  Shirlee More, MD    Referring MD: Algis Greenhouse, MD    ASSESSMENT:    1. Hypertensive heart disease without heart failure   2. PAF (paroxysmal atrial fibrillation) ([place])   3. On amiodarone therapy    PLAN:    In order of problems listed above:  1. Clinically she had massive pulmonary embolism with syncope and is not anticoagulated speak to GI and we they need to move upper endoscopy or consider having an IVC filter.  She is at very high risk of recurrence and adverse outcome. 2. Poorly controlled hypertension increase hydralazine 3. Stable in sinus rhythm continue amiodarone her EKG changes appear to be injury and I suspect is due to massive pulmonary embolism 4. Continue her low-dose anticoagulant   Next appointment: 3 weeks   Medication Adjustments/Labs and Tests Ordered: Current medicines are reviewed at length with the patient today.  Concerns regarding medicines are outlined above.  No orders of the defined types were placed in this encounter.  No orders of the defined types were placed in this encounter.   Chief Complaint  Patient presents with  . Atrial Fibrillation  . Anticoagulation  . Anemia    with GI bleed on apixaban    History of Present Illness:    Jade Mclean is a 73 y.o. adult with a hx of PAF heart failure T2 DM hypertension stage 4 CKD and GI bleed with active PUD with anticoagulation for PE referred by Dr Garlon Hatchet. She had demand ischemia form anemia requiring 4 units of PRBC . Compliance with diet, lifestyle and medications: Yes  Her story begins with having syncope at church presented to [facility]/2018 was found to have a large pulmonary embolism by VQ scan with her CKD and echocardiogram showed severe pulmonary artery hypertension PA systolic at greater than 70 mmHg.  She was  anticoagulated and although there is no discharge summary she and her brother tell me she had a massive GI bleed transferred to [place] extremity showed no evidence of DVT the pulmonary embolism was without modifiable risk factors she did not have an IVC filter and she is not anticoagulated she has been treated for ulcer disease and seeing Dr. Melina Copa 09/22/2018 and I will speak to on the phone and see if I can move her office visit up and I think she then needs to be anticoagulated or have an IVC filter.  Her blood pressure is poorly controlled she is bradycardic and complaining of fatigue taking beta-blocker.  No chest pain shortness of breath palpitation or syncope since she left the hospital her bowels are normal Past Medical History:  Diagnosis Date  . Acute blood loss anemia   . Benign hypertension 08/16/2015  . CHF (congestive heart failure) ([place])   . Chronic kidney disease, stage 3 ([place]) 02/20/2016  . Chronic rheumatic arthritis ([place]) 08/16/2015  . Fatigue 05/22/2018  . GI bleeding 07/05/2018  . Mixed hyperlipidemia 08/16/2015  . Obesity (BMI 30-39.9) 09/18/2017  . Osteoporosis 02/20/2016   2010: -2.1 2014: -1.8 2018: no fracture, no treatment 2019: -1.2  . Type 2 diabetes mellitus without complication, without long-term current use of insulin ([place]) 08/16/2015    Past Surgical History:  Procedure Laterality Date  . ESOPHAGOGASTRODUODENOSCOPY (EGD) WITH PROPOFOL N/A 07/05/2018   Procedure: ESOPHAGOGASTRODUODENOSCOPY (EGD) WITH PROPOFOL;  Surgeon: Juanita Craver, MD;  Location: [facility] ENDOSCOPY;  Service: Endoscopy;  Laterality: N/A;  . KNEE SURGERY Right     Current Medications: Current Meds  Medication Sig  . amiodarone (PACERONE) 200 MG tablet Take 1 tablet (200 mg total) by mouth daily.  Marland Kitchen amLODipine (NORVASC) 10 MG tablet Take 1 tablet (10 mg total) by mouth daily.  . Calcium Carbonate-Vitamin D 600-400 MG-UNIT chew tablet Chew 1 tablet by mouth daily.  . carvedilol (COREG)  25 MG tablet Take 25 mg by mouth 2 (two) times daily.  . cloNIDine (CATAPRES - DOSED IN MG/24 HR) 0.2 mg/24hr patch Place 1 patch (0.2 mg total) onto the skin every Sunday.  . etanercept (ENBREL) 25 MG injection Inject 25 mg into the skin every Friday.  . ezetimibe (ZETIA) 10 MG tablet Take 10 mg by mouth daily.  . furosemide (LASIX) 40 MG tablet Take 1 tablet (40 mg total) by mouth daily.  . hydrALAZINE (APRESOLINE) 25 MG tablet Take 1 tablet by mouth 3 (three) times daily.  . insulin degludec (TRESIBA) 100 UNIT/ML SOPN FlexTouch Pen Inject 12 Units into the skin daily.   . Multiple Vitamin (MULTIVITAMIN WITH MINERALS) TABS tablet Take 1 tablet by mouth daily.  . pantoprazole (PROTONIX) 40 MG tablet Take 1 tablet (40 mg total) by mouth 2 (two) times daily.     Allergies:   Colesevelam; Dulaglutide; Rosuvastatin; and Simvastatin   Social History   Socioeconomic History  . Marital status: Single    Spouse name: Not on file  . Number of children: Not on file  . Years of education: Not on file  . Highest education level: Not on file  Occupational History  . Not on file  Social Needs  . Financial resource strain: Not on file  . Food insecurity:    Worry: Not on file    Inability: Not on file  . Transportation needs:    Medical: Not on file    Non-medical: Not on file  Tobacco Use  . Smoking status: Never Smoker  . Smokeless tobacco: Never Used  Substance and Sexual Activity  . Alcohol use: Never    Frequency: Never  . Drug use: Never  . Sexual activity: Not on file  Lifestyle  . Physical activity:    Days per week: Not on file    Minutes per session: Not on file  . Stress: Not on file  Relationships  . Social connections:    Talks on phone: Not on file    Gets together: Not on file    Attends religious service: Not on file    Active member of club or organization: Not on file    Attends meetings of clubs or organizations: Not on file    Relationship status: Not on file    Other Topics Concern  . Not on file  Social History Narrative  . Not on file     Family History: The patient's family history includes Diabetes in his brother, mother, and sister; Heart attack in his brother; Tuberculosis in his father. ROS:  Review of Systems  Constitution: Positive for malaise/fatigue.  HENT: Negative.   Eyes: Negative.  Negative for visual halos.  Cardiovascular: Negative.   Respiratory: Negative.   Endocrine: Negative.   Hematologic/Lymphatic: Negative.   Skin: Positive for color change.  Musculoskeletal: Positive for joint pain.  Gastrointestinal: Negative.   Genitourinary: Negative.   Neurological: Positive for weakness.  Psychiatric/Behavioral: Negative.   Allergic/Immunologic: Negative.    Please see the  history of present illness.    All other systems reviewed and are negative.  EKGs/Labs/Other Studies Reviewed:    The following studies were reviewed today:  EKG:  EKG ordered today and personally reviewed.  The ekg ordered today demonstrates sinus rhythm diffuse T wave inversion injury more prominent than her previous EKG at the hospital  Recent Labs: 07/04/2018: ALT 19 07/14/2018: BUN 31; Creatinine, Ser 2.18; Hemoglobin 9.8; Platelets 172; Potassium 3.5; Sodium 139  Recent Lipid Panel No results found for: CHOL, TRIG, HDL, CHOLHDL, VLDL, LDLCALC, LDLDIRECT  Physical Exam:    VS:  BP (!) 194/58 (BP Location: Right Arm, Patient Position: Sitting, Cuff Size: Large)   Pulse (!) 53   Ht 5\' 6"  (1.676 m)   Wt 207 lb 6.4 oz (94.1 kg)   SpO2 99%   BMI 33.48 kg/m     Wt Readings from Last 3 Encounters:  09/15/18 207 lb 6.4 oz (94.1 kg)  07/14/18 219 lb 12.8 oz (99.7 kg)     GEN: She looks chronically ill well nourished, well developed in no acute distress HEENT: Normal NECK: No JVD; No carotid bruits LYMPHATICS: No lymphadenopathy CARDIAC: RRR, no murmurs, rubs, gallops RESPIRATORY:  Clear to auscultation without rales, wheezing or rhonchi   ABDOMEN: Soft, non-tender, non-distended MUSCULOSKELETAL:  No edema; No deformity  SKIN: Warm and dry NEUROLOGIC:  Alert and oriented x 3 PSYCHIATRIC:  Normal affect    Signed, Shirlee More, MD  09/15/2018 10:32 AM    [place]

## 2018-09-15 ENCOUNTER — Other Ambulatory Visit: Payer: Self-pay | Admitting: Cardiology

## 2018-09-15 ENCOUNTER — Ambulatory Visit: Payer: Medicare Other | Admitting: Cardiology

## 2018-09-15 ENCOUNTER — Encounter: Payer: Self-pay | Admitting: Cardiology

## 2018-09-15 VITALS — BP 194/58 | HR 53 | Ht 66.0 in | Wt 207.4 lb

## 2018-09-15 DIAGNOSIS — Z79899 Other long term (current) drug therapy: Secondary | ICD-10-CM

## 2018-09-15 DIAGNOSIS — I2699 Other pulmonary embolism without acute cor pulmonale: Secondary | ICD-10-CM | POA: Insufficient documentation

## 2018-09-15 DIAGNOSIS — I480 Paroxysmal atrial fibrillation: Secondary | ICD-10-CM | POA: Diagnosis not present

## 2018-09-15 DIAGNOSIS — I119 Hypertensive heart disease without heart failure: Secondary | ICD-10-CM | POA: Diagnosis not present

## 2018-09-15 MED ORDER — HYDRALAZINE HCL 50 MG PO TABS: 50.0000 mg | ORAL_TABLET | Freq: Three times a day (TID) | ORAL | 1 refills | Status: DC

## 2018-09-15 MED ORDER — CARVEDILOL 25 MG PO TABS: 12.5000 mg | ORAL_TABLET | Freq: Two times a day (BID) | ORAL | Status: DC

## 2018-09-15 NOTE — Patient Instructions (Signed)
Medication Instructions:  Your physician has recommended you make the following change in your medication:  DECREASE carvedilol (coreg) 25 mg: Take 0.5 tablet (12.5 mg) twice daily  INCREASE hydralazine (apresoline) 50 mg: Take 1 tablet three times daily   If you need a refill on your cardiac medications before your next appointment, please call your pharmacy.   Lab work: None  If you have labs (blood work) drawn today and your tests are completely normal, you will receive your results only by: Marland Kitchen MyChart Message (if you have MyChart) OR . A paper copy in the mail If you have any lab test that is abnormal or we need to change your treatment, we will call you to review the results.  Testing/Procedures: You had an EKG today.   Follow-Up: At [facility], you and your health needs are our priority.  As part of our continuing mission to provide you with exceptional heart care, we have created designated Provider Care Teams.  These Care Teams include your primary Cardiologist (physician) and Advanced Practice Providers (APPs -  Physician Assistants and Nurse Practitioners) who all work together to provide you with the care you need, when you need it. You will need a follow up appointment in 3 weeks.      Any Other Special Instructions Will Be Listed Below:  **Buy a new blood pressure cuff and check your BP twice daily at the same time every day. Keep a record of these readings and bring this log with you to your next appointment with Dr. Bettina Gavia for him to review.   **Your appointment with Dr. Melina Copa has been moved to Thursday, 09/17/2018, at 2:00 pm.

## 2018-09-28 ENCOUNTER — Telehealth: Payer: Self-pay | Admitting: Cardiology

## 2018-09-28 MED ORDER — HYDRALAZINE HCL 50 MG PO TABS: 50.0000 mg | ORAL_TABLET | Freq: Four times a day (QID) | ORAL | 0 refills | Status: DC

## 2018-09-28 NOTE — Telephone Encounter (Signed)
Increase hydralazine to 50 mg 4 times daily last dose at bedtime

## 2018-09-28 NOTE — Telephone Encounter (Signed)
Patient informed to increase her hydralazine 50 mg from three times daily to four times daily. Patient verbalized understanding. Patient will continue to monitor her BP and HR daily. She will contact our office with any further questions or concerns.

## 2018-09-28 NOTE — Telephone Encounter (Signed)
Patient called her PCP, Dr. Garlon Hatchet, to report her most recent blood pressure readings and Dr. Garlon Hatchet recommended patient to contact our office due to elevated readings as follows: In the morning before medications: 168/61,152/49, 146/58, 166/51, 140/70, 158/63, 170/54, 163/47 and in the afternoon: 169/71, 155/50, 140/70, 160/64. Patient's heart rate has been in the 90's consistently. Patient has been taking all her medications as prescribed. Patient has complaints of drowsiness only. During her last office visit 09/15/2018, hydralazine 50 mg three times daily was prescribed. Will have Dr. Bettina Gavia advise and update patient accordingly.

## 2018-09-28 NOTE — Telephone Encounter (Signed)
Dr Garlon Hatchet called patient Friday and she gave him a 5 day reading of her BP;s and Dr Garlon Hatchet said that they were still high and to call you and inform you.

## 2018-09-28 NOTE — Addendum Note (Signed)
Addended by: Austin Miles on: 09/28/2018 04:10 PM   Modules accepted: Orders

## 2018-10-06 ENCOUNTER — Telehealth: Payer: Self-pay | Admitting: Cardiology

## 2018-10-06 NOTE — Telephone Encounter (Signed)
Cardiac Questionnaire:    Since your last visit or hospitalization:    1. Have you been having new or worsening chest pain? no   2. Have you been having new or worsening shortness of breath? no 3. Have you been having new or worsening leg swelling, wt gain, or increase in abdominal girth (pants fitting more tightly)? no   4. Have you had any passing out spells? no    *A YES to any of these questions would result in the appointment being kept. *If all the answers to these questions are NO, we should indicate that given the current situation regarding the worldwide coronarvirus pandemic, at the recommendation of the CDC, we are looking to limit gatherings in our waiting area, and thus will reschedule their appointment beyond four weeks from today.   _____________   [id number] Pre-Screening Questions:   Do you currently have a fever? no  Have you recently travelled on a cruise, internationally, or to [place], [place], [place], [place], [place], or [place], [place]) ? no  Have you been in contact with someone that is currently pending confirmation of Covid19 testing or has been confirmed to have the [place] virus?  no Are you currently experiencing fatigue or cough? No    CONSENT FOR TELE-HEALTH VISIT - PLEASE RVIEW  I hereby voluntarily request, consent and authorize [facility] and its employed or contracted physicians, physician assistants, nurse practitioners or other licensed health care professionals (the Practitioner), to provide me with telemedicine health care services (the Services") as deemed necessary by the treating Practitioner. I acknowledge and consent to receive the Services by the Practitioner via telemedicine. I understand that the telemedicine visit will involve communicating with the Practitioner through live audiovisual communication technology and the disclosure of certain medical information by electronic transmission. I acknowledge that I have been given the opportunity to  request an in-person assessment or other available alternative prior to the telemedicine visit and am voluntarily participating in the telemedicine visit.  I understand that I have the right to withhold or withdraw my consent to the use of telemedicine in the course of my care at any time, without affecting my right to future care or treatment, and that the Practitioner or I may terminate the telemedicine visit at any time. I understand that I have the right to inspect all information obtained and/or recorded in the course of the telemedicine visit and may receive copies of available information for a reasonable fee.  I understand that some of the potential risks of receiving the Services via telemedicine include:   Delay or interruption in medical evaluation due to technological equipment failure or disruption;  Information transmitted may not be sufficient (e.g. poor resolution of images) to allow for appropriate medical decision making by the Practitioner; and/or   In rare instances, security protocols could fail, causing a breach of personal health information.  Furthermore, I acknowledge that it is my responsibility to provide information about my medical history, conditions and care that is complete and accurate to the best of my ability. I acknowledge that Practitioner's advice, recommendations, and/or decision may be based on factors not within their control, such as incomplete or inaccurate data provided by me or distortions of diagnostic images or specimens that may result from electronic transmissions. I understand that the practice of medicine is not an exact science and that Practitioner makes no warranties or guarantees regarding treatment outcomes. I acknowledge that I will receive a copy of this consent concurrently upon execution via  email to the email address I last provided but may also request a printed copy by calling the office of [place].    I understand that my insurance  will be billed for this visit.   I have read or had this consent read to me.  I understand the contents of this consent, which adequately explains the benefits and risks of the Services being provided via telemedicine.   I have been provided ample opportunity to ask questions regarding this consent and the Services and have had my questions answered to my satisfaction.  I give my informed consent for the services to be provided through the use of telemedicine in my medical care  By participating in this telemedicine visit I agree to the above. Patient agrees to the televisit/pp

## 2018-10-08 ENCOUNTER — Encounter: Payer: Self-pay | Admitting: Cardiology

## 2018-10-08 ENCOUNTER — Encounter: Payer: Self-pay | Admitting: *Deleted

## 2018-10-08 ENCOUNTER — Telehealth (INDEPENDENT_AMBULATORY_CARE_PROVIDER_SITE_OTHER): Payer: Medicare Other | Admitting: Cardiology

## 2018-10-08 VITALS — BP 177/63 | HR 96 | Ht 66.0 in | Wt 209.0 lb

## 2018-10-08 DIAGNOSIS — I119 Hypertensive heart disease without heart failure: Secondary | ICD-10-CM

## 2018-10-08 DIAGNOSIS — I480 Paroxysmal atrial fibrillation: Secondary | ICD-10-CM

## 2018-10-08 DIAGNOSIS — Z79899 Other long term (current) drug therapy: Secondary | ICD-10-CM

## 2018-10-08 DIAGNOSIS — D500 Iron deficiency anemia secondary to blood loss (chronic): Secondary | ICD-10-CM

## 2018-10-08 DIAGNOSIS — N184 Chronic kidney disease, stage 4 (severe): Secondary | ICD-10-CM

## 2018-10-08 DIAGNOSIS — Z8719 Personal history of other diseases of the digestive system: Secondary | ICD-10-CM

## 2018-10-08 DIAGNOSIS — I2609 Other pulmonary embolism with acute cor pulmonale: Secondary | ICD-10-CM

## 2018-10-08 MED ORDER — FAMOTIDINE 40 MG PO TABS: 40.0000 mg | ORAL_TABLET | Freq: Every day | ORAL | 2 refills | Status: DC

## 2018-10-08 MED ORDER — APIXABAN 5 MG PO TABS: 5.0000 mg | ORAL_TABLET | Freq: Two times a day (BID) | ORAL | 2 refills | Status: DC

## 2018-10-08 MED ORDER — CARVEDILOL 25 MG PO TABS: 25.0000 mg | ORAL_TABLET | Freq: Two times a day (BID) | ORAL | 1 refills | Status: DC

## 2018-10-08 NOTE — Progress Notes (Signed)
The endoscopy notes 09/18/2018 showed a healed duodenal ulcer and a recommendation to restart anticoagulant therapy.

## 2018-10-08 NOTE — Progress Notes (Signed)
Virtual Visit via Telephone Note    Evaluation Performed:  Follow-up visit  This visit type was conducted due to national recommendations for restrictions regarding the COVID-19 Pandemic (e.g. social distancing).  This format is felt to be most appropriate for this patient at this time.  All issues noted in this document were discussed and addressed.  No physical exam was performed (except for noted visual exam findings with Video Visits).  Please refer to the patient's chart (MyChart message for video visits and phone note for telephone visits) for the patient's consent to telehealth for [facility].  Date:  10/08/2018   ID:  Jade Mclean, DOB 04-Apr-1946, MRN [id number]  Patient Location:  Home  Provider location:   [place]  PCP:  Algis Greenhouse, MD  Cardiologist:  No primary care provider on file. Dr Bettina Gavia Electrophysiologist:  None   Chief Complaint:  FU re anticoagulation  History of Present Illness:    Jade Mclean is a 73 y.o. adult who presents via audio/video conferencing for a telehealth visit today.  She was last seen 09/15/2018 I called referred her to be seen by GI for quick evaluation with endoscopy to allow reinstitution of anticoagulation.  The patient does not have symptoms concerning for COVID-19 infection (fever, chills, cough, or new shortness of breath).   Jade Mclean is a 73 y.o. adult with a hx of PAF heart failure T2 DM hypertension stage 4 CKD and GI bleed with active PUD with anticoagulation for PE referred by Dr Garlon Hatchet. She had demand ischemia form anemia requiring 4 units of PRBC .   Her story begins with having syncope at church presented to [facility] 06/26/2018 was found to have a large pulmonary embolism by VQ scan with her CKD and echocardiogram showed severe pulmonary artery hypertension PA systolic at greater than 70 mmHg.  She was anticoagulated and although there is no discharge summary she and her brother tell me she had a  massive GI bleed and was transferred to [place] extremity showed no evidence of DVT the pulmonary embolism was without modifiable risk factors she did not have an IVC filter.  Is a very difficult visit and I need to access the records from [facility] before starting anticoagulation.  She tells me she had an endoscopy she was told everything is good.  Her home blood pressures remain elevated 170 and 180 in the morning [id number] systolic in the afternoon but she has no edema shortness of breath chest pain palpitation or syncope and no recurrent bleeding.  I went over her medications with her from the list and she is taking her PPI twice a day but complains of having epigastric pain after eating.  I am going to add an H2 blocker.  For safety I will delay starting her anticoagulant.see the endoscopy record.  With taking amiodarone CKD anemia history of GI bleed amiodarone therapy and they were forced next week to bring her in and check comprehensive panel CBC and a TSH.  We will do a follow-up tele-visit in 2 weeks.  Prior CV studies:   The following studies were reviewed today:    Past Medical History:  Diagnosis Date  . Acute blood loss anemia   . Benign hypertension 08/16/2015  . CHF (congestive heart failure) ([place])   . Chronic kidney disease, stage 3 ([place]) 02/20/2016  . Chronic rheumatic arthritis ([place]) 08/16/2015  . Fatigue 05/22/2018  . GI bleeding 07/05/2018  . Mixed hyperlipidemia 08/16/2015  .  Obesity (BMI 30-39.9) 09/18/2017  . Osteoporosis 02/20/2016   2010: -2.1 2014: -1.8 2018: no fracture, no treatment 2019: -1.2  . Type 2 diabetes mellitus without complication, without long-term current use of insulin ([place]) 08/16/2015   Past Surgical History:  Procedure Laterality Date  . ESOPHAGOGASTRODUODENOSCOPY (EGD) WITH PROPOFOL N/A 07/05/2018   Procedure: ESOPHAGOGASTRODUODENOSCOPY (EGD) WITH PROPOFOL;  Surgeon: Juanita Craver, MD;  Location: [facility] ENDOSCOPY;  Service: Endoscopy;   Laterality: N/A;  . KNEE SURGERY Right      No outpatient medications have been marked as taking for the 10/08/18 encounter (Appointment) with Richardo Priest, MD.     Allergies:   Colesevelam; Dulaglutide; Rosuvastatin; and Simvastatin   Social History   Tobacco Use  . Smoking status: Never Smoker  . Smokeless tobacco: Never Used  Substance Use Topics  . Alcohol use: Never    Frequency: Never  . Drug use: Never     Family Hx: The patient's family history includes Diabetes in his brother, mother, and sister; Heart attack in his brother; Tuberculosis in his father.  ROS:   Please see the history of present illness.     All other systems reviewed and are negative.   Labs/Other Tests and Data Reviewed:    Recent Labs: 07/04/2018: ALT 19 07/14/2018: BUN 31; Creatinine, Ser 2.18; Hemoglobin 9.8; Platelets 172; Potassium 3.5; Sodium 139   Recent Lipid Panel No results found for: CHOL, TRIG, HDL, CHOLHDL, LDLCALC, LDLDIRECT  Wt Readings from Last 3 Encounters:  09/15/18 207 lb 6.4 oz (94.1 kg)  07/14/18 219 lb 12.8 oz (99.7 kg)     Objective:    Vital Signs:  There were no vitals taken for this visit.   She sounds very frail on the phone  ASSESSMENT & PLAN:    1.  Paroxysmal atrial fibrillation, asymptomatic continue beta-blocker amiodarone and await endoscopy records before restarting anticoagulation 2.  Amiodarone therapy check labs for toxicity CMP TSH next week 3.  Hypertensive heart disease without heart failure BP remains poorly controlled despite high doses of multiple antihypertensives and will increase the dose of her carvedilol with home heart rates running in the 90s. 4.  CKD recheck labs next week 5.  Upper GI bleed continue PPI add H2 blocker await EGD record if ulcer is healed start her anticoagulation 6.  Anemia recheck CBC 7.  Pulmonary embolism she will need to restart anticoagulation with clinical massive PE  COVID-19 Education: The signs and  symptoms of COVID-19 were discussed with the patient and how to seek care for testing (follow up with PCP or arrange E-visit).  The importance of social distancing was discussed today.  Patient Risk:   After full review of this patient's clinical status, I feel that they are at least moderate risk at this time.  Time:   Today, I have spent 30 minutes with the patient with telehealth technology discussing the above complicated issues.     Medication Adjustments/Labs and Tests Ordered: Current medicines are reviewed at length with the patient today.  Concerns regarding medicines are outlined above.  Tests Ordered: No orders of the defined types were placed in this encounter.  Medication Changes: No orders of the defined types were placed in this encounter.   Disposition:  Follow up 2 weeks  Signed, Shirlee More, MD  10/08/2018 2:05 PM    [place]

## 2018-10-08 NOTE — Addendum Note (Signed)
Addended by: Austin Miles on: 10/08/2018 04:52 PM   Modules accepted: Orders

## 2018-10-08 NOTE — Patient Instructions (Addendum)
Medication Instructions:  Your physician has recommended you make the following change in your medication:   INCREASE carvedilol (coreg) 25 mg: Take 1 tablet twice daily   START famotidine (pepcid) 40 mg: Take 1 tablet daily   RESTART apixaban (eliquis) 5 mg: Take 1 tablet twice dialy  If you need a refill on your cardiac medications before your next appointment, please call your pharmacy.   Lab work: Your physician recommends that you return for lab work in 1 week: CBC, TSH, CMP.   If you have labs (blood work) drawn today and your tests are completely normal, you will receive your results only by: Marland Kitchen MyChart Message (if you have MyChart) OR . A paper copy in the mail If you have any lab test that is abnormal or we need to change your treatment, we will call you to review the results.  Testing/Procedures: None  Follow-Up: At [facility], you and your health needs are our priority.  As part of our continuing mission to provide you with exceptional heart care, we have created designated Provider Care Teams.  These Care Teams include your primary Cardiologist (physician) and Advanced Practice Providers (APPs -  Physician Assistants and Nurse Practitioners) who all work together to provide you with the care you need, when you need it. . You will need a follow up appointment in 2 weeks as a televisit: Thursday, 10/22/2018, at 1:20 pm.     Famotidine tablets or gelcaps What is this medicine? FAMOTIDINE (fa MOE ti deen) is a type of antihistamine that blocks the release of stomach acid. It is used to treat stomach or intestinal ulcers. It can also relieve heartburn from acid reflux. This medicine may be used for other purposes; ask your health care provider or pharmacist if you have questions. COMMON BRAND NAME(S): Heartburn Relief, Pepcid, Pepcid AC, Pepcid AC Maximum Strength What should I tell my health care provider before I take this medicine? They need to know if you have any of  these conditions: -kidney or liver disease -trouble swallowing -an unusual or allergic reaction to famotidine, other medicines, foods, dyes, or preservatives -pregnant or trying to get pregnant -breast-feeding How should I use this medicine? Take this medicine by mouth with a glass of water. Follow the directions on the prescription label. If you only take this medicine once a day, take it at bedtime. Take your doses at regular intervals. Do not take your medicine more often than directed. Talk to your pediatrician regarding the use of this medicine in children. Special care may be needed. Overdosage: If you think you have taken too much of this medicine contact a poison control center or emergency room at once. NOTE: This medicine is only for you. Do not share this medicine with others. What if I miss a dose? If you miss a dose, take it as soon as you can. If it is almost time for your next dose, take only that dose. Do not take double or extra doses. What may interact with this medicine? -delavirdine -itraconazole -ketoconazole This list may not describe all possible interactions. Give your health care provider a list of all the medicines, herbs, non-prescription drugs, or dietary supplements you use. Also tell them if you smoke, drink alcohol, or use illegal drugs. Some items may interact with your medicine. What should I watch for while using this medicine? Tell your doctor or health care professional if your condition does not start to get better or if it gets worse. Finish the full  course of tablets prescribed, even if you feel better. Do not take with aspirin, ibuprofen or other antiinflammatory medicines. These can make your condition worse. Do not smoke cigarettes or drink alcohol. These cause irritation in your stomach and can increase the time it will take for ulcers to heal. If you get black, tarry stools or vomit up what looks like coffee grounds, call your doctor or health care  professional at once. You may have a bleeding ulcer. This medicine may cause a decrease in vitamin B12. You should make sure that you get enough vitamin B12 while you are taking this medicine. Discuss the foods you eat and the vitamins you take with your health care professional. What side effects may I notice from receiving this medicine? Side effects that you should report to your doctor or health care professional as soon as possible: -agitation, nervousness -confusion -hallucinations -skin rash, itching Side effects that usually do not require medical attention (report to your doctor or health care professional if they continue or are bothersome): -constipation -diarrhea -dizziness -headache This list may not describe all possible side effects. Call your doctor for medical advice about side effects. You may report side effects to FDA at 1-800-FDA-1088. Where should I keep my medicine? Keep out of the reach of children. Store at room temperature between 15 and 30 degrees C (59 and 86 degrees F). Do not freeze. Throw away any unused medicine after the expiration date. NOTE: This sheet is a summary. It may not cover all possible information. If you have questions about this medicine, talk to your doctor, pharmacist, or health care provider.  2019 Elsevier/Gold Standard (2017-02-07 13:17:50)

## 2018-10-22 ENCOUNTER — Telehealth (INDEPENDENT_AMBULATORY_CARE_PROVIDER_SITE_OTHER): Payer: Medicare Other | Admitting: Cardiology

## 2018-10-22 ENCOUNTER — Encounter: Payer: Self-pay | Admitting: Cardiology

## 2018-10-22 ENCOUNTER — Other Ambulatory Visit: Payer: Self-pay | Admitting: Cardiology

## 2018-10-22 ENCOUNTER — Other Ambulatory Visit: Payer: Self-pay

## 2018-10-22 VITALS — BP 168/59 | HR 97 | Ht 66.0 in | Wt 177.0 lb

## 2018-10-22 DIAGNOSIS — Z79899 Other long term (current) drug therapy: Secondary | ICD-10-CM

## 2018-10-22 DIAGNOSIS — I2609 Other pulmonary embolism with acute cor pulmonale: Secondary | ICD-10-CM

## 2018-10-22 DIAGNOSIS — I5032 Chronic diastolic (congestive) heart failure: Secondary | ICD-10-CM

## 2018-10-22 DIAGNOSIS — D62 Acute posthemorrhagic anemia: Secondary | ICD-10-CM

## 2018-10-22 DIAGNOSIS — N183 Chronic kidney disease, stage 3 unspecified: Secondary | ICD-10-CM

## 2018-10-22 DIAGNOSIS — I480 Paroxysmal atrial fibrillation: Secondary | ICD-10-CM

## 2018-10-22 DIAGNOSIS — Z7901 Long term (current) use of anticoagulants: Secondary | ICD-10-CM

## 2018-10-22 DIAGNOSIS — I110 Hypertensive heart disease with heart failure: Secondary | ICD-10-CM

## 2018-10-22 MED ORDER — LABETALOL HCL 100 MG PO TABS: 100.0000 mg | ORAL_TABLET | Freq: Two times a day (BID) | ORAL | 2 refills | Status: DC

## 2018-10-22 MED ORDER — FUROSEMIDE 40 MG PO TABS: 40.0000 mg | ORAL_TABLET | Freq: Every day | ORAL | Status: DC

## 2018-10-22 MED ORDER — PANTOPRAZOLE SODIUM 40 MG PO TBEC: 40.0000 mg | DELAYED_RELEASE_TABLET | Freq: Every day | ORAL | 11 refills | Status: AC

## 2018-10-22 MED ORDER — FAMOTIDINE 40 MG PO TABS: 40.0000 mg | ORAL_TABLET | Freq: Every day | ORAL | 2 refills | Status: AC

## 2018-10-22 NOTE — Telephone Encounter (Signed)
Furosemide sent to pharmacy

## 2018-10-22 NOTE — Progress Notes (Signed)
Virtual Visit via Telephone Note   This visit type was conducted due to national recommendations for restrictions regarding the COVID-19 Pandemic (e.g. social distancing) in an effort to limit this patient's exposure and mitigate transmission in our community.  Due to her co-morbid illnesses, this patient is at least at moderate risk for complications without adequate follow up.  This format is felt to be most appropriate for this patient at this time.  The patient did not have access to video technology/had technical difficulties with video requiring transitioning to audio format only (telephone).  All issues noted in this document were discussed and addressed.  No physical exam could be performed with this format.  Please refer to the patient's chart for her  consent to telehealth for [facility].   Evaluation Performed:  Follow-up visit  Date:  10/22/2018   ID:  Jade Mclean, DOB 06/18/1946, MRN [id number]  Patient Location: Home Provider Location: Office  PCP:  Algis Greenhouse, MD  Cardiologist:  No primary care provider on file. Dr Bettina Gavia Electrophysiologist:  None   Chief Complaint:  Jade Mclean now anticoagulated  History of Present Illness:    Jade Mclean is a 73 y.o. adult with a hx of PAF heart failure T2 DM hypertension stage 4 CKD and GI bleed with active PUD with anticoagulation for PE referred by Dr Garlon Hatchet. She had demand ischemia form anemia requiring 4 units of PRBC .    Her story begins with having syncope at church presented to [facility] 06/26/2018 was found to have a large pulmonary embolism by VQ scan with her CKD and echocardiogram showed severe pulmonary artery hypertension PA systolic at greater than 70 mmHg.  She was anticoagulated and although there is no discharge summary she and her brother tell me she had a massive GI bleed and was transferred to [facility] extremity showed no evidence of DVT the pulmonary embolism was without modifiable  risk factors she did not have an IVC filter.  Last visit I cautiously resumed anticoagulation she was to have outpatient labs performed and did not due to lack of transportation  When she left the hospital in January she was taking Protonix as well as an H2 blocker she has been seen by Dr. Melina Copa I am not sure how but in some fashion she is no longer taking a PPI and she is complaining of abdominal pain on her anticoagulant.  She has had no nausea vomiting or bleeding.  I will have her resume the PPI and she assures me she will come to my office next week with transportation for labs including a CBC.  Her other complaint is hypertension consistently systolic greater than 366.  She has no edema shortness of breath palpitation or syncope.  She also complains of fatigue she is taking amiodarone.  The patient does not have symptoms concerning for COVID-19 infection (fever, chills, cough, or new shortness of breath).    Past Medical History:  Diagnosis Date   Acute blood loss anemia    Benign hypertension 08/16/2015   CHF (congestive heart failure) (HCC)    Chronic kidney disease, stage 3 ([place]) 02/20/2016   Chronic rheumatic arthritis ([place]) 08/16/2015   Fatigue 05/22/2018   GI bleeding 07/05/2018   Mixed hyperlipidemia 08/16/2015   Obesity (BMI 30-39.9) 09/18/2017   Osteoporosis 02/20/2016   2010: -2.1 2014: -1.8 2018: no fracture, no treatment 2019: -1.2   Type 2 diabetes mellitus without complication, without long-term current use of insulin ([place])  08/16/2015   Past Surgical History:  Procedure Laterality Date   ESOPHAGOGASTRODUODENOSCOPY (EGD) WITH PROPOFOL N/A 07/05/2018   Procedure: ESOPHAGOGASTRODUODENOSCOPY (EGD) WITH PROPOFOL;  Surgeon: Juanita Craver, MD;  Location: [facility] ENDOSCOPY;  Service: Endoscopy;  Laterality: N/A;   KNEE SURGERY Right      Current Meds  Medication Sig   amiodarone (PACERONE) 200 MG tablet Take 1 tablet (200 mg total) by mouth daily.   amLODipine (NORVASC)  10 MG tablet Take 1 tablet (10 mg total) by mouth daily.   apixaban (ELIQUIS) 5 MG TABS tablet Take 1 tablet (5 mg total) by mouth 2 (two) times daily.   Calcium Carbonate-Vitamin D 600-400 MG-UNIT chew tablet Chew 1 tablet by mouth daily.   carvedilol (COREG) 25 MG tablet Take 1 tablet (25 mg total) by mouth 2 (two) times daily.   cloNIDine (CATAPRES - DOSED IN MG/24 HR) 0.2 mg/24hr patch Place 1 patch (0.2 mg total) onto the skin every Sunday.   etanercept (ENBREL) 25 MG injection Inject 25 mg into the skin every Friday.   ezetimibe (ZETIA) 10 MG tablet Take 10 mg by mouth daily.   famotidine (PEPCID) 40 MG tablet Take 1 tablet (40 mg total) by mouth daily.   furosemide (LASIX) 40 MG tablet Take 1 tablet (40 mg total) by mouth daily.   hydrALAZINE (APRESOLINE) 50 MG tablet Take 1 tablet (50 mg total) by mouth 4 (four) times daily.   insulin degludec (TRESIBA) 100 UNIT/ML SOPN FlexTouch Pen Inject 12 Units into the skin daily.    Multiple Vitamin (MULTIVITAMIN WITH MINERALS) TABS tablet Take 1 tablet by mouth daily.     Allergies:   Colesevelam; Dulaglutide; Nsaids; Rosuvastatin; and Simvastatin   Social History   Tobacco Use   Smoking status: Never Smoker   Smokeless tobacco: Never Used  Substance Use Topics   Alcohol use: Never    Frequency: Never   Drug use: Never     Family Hx: The patient's family history includes Diabetes in her brother, mother, and sister; Heart attack in her brother; Tuberculosis in her father.  ROS:   Please see the history of present illness.     All other systems reviewed and are negative.   Prior CV studies:   The following studies were reviewed today:    Labs/Other Tests and Data Reviewed:    EKG:  No ECG reviewed.  Recent Labs: 07/04/2018: ALT 19 07/14/2018: BUN 31; Creatinine, Ser 2.18; Hemoglobin 9.8; Platelets 172; Potassium 3.5; Sodium 139   Recent Lipid Panel No results found for: CHOL, TRIG, HDL, CHOLHDL, LDLCALC,  LDLDIRECT  Wt Readings from Last 3 Encounters:  10/22/18 177 lb (80.3 kg)  10/08/18 209 lb (94.8 kg)  09/15/18 207 lb 6.4 oz (94.1 kg)     Objective:    Vital Signs:  BP (!) 168/59    Pulse 97    Ht 5\' 6"  (1.676 m)    Wt 177 lb (80.3 kg)    BMI 28.57 kg/m    Alert oriented female in no acute distress.   ASSESSMENT & PLAN:    1. Pulmonary embolism unprovoked she is now on an anticoagulant restart PPI with abdominal pain continue H2 blocker history of ulcer disease and must come the office next week for lab work including a CBC 2. Paroxysmal atrial fibrillation continue low-dose amiodarone anticoagulation 3. Continue her anticoagulant I am uncomfortable reducing the dose with massive pulmonary embolism readdress at the next visit 4. Check liver function thyroid may be responsible for  fatigue 5. Poorly controlled hypertension switch carvedilol to labetalol 6. Must recheck labs next week 7. Restart PPI and will check CBC next week  COVID-19 Education: The signs and symptoms of COVID-19 were discussed with the patient and how to seek care for testing (follow up with PCP or arrange E-visit).  The importance of social distancing was discussed today.  Time:   Today, I have spent 30 minutes with the patient with telehealth technology discussing the above problems.     Medication Adjustments/Labs and Tests Ordered: Current medicines are reviewed at length with the patient today.  Concerns regarding medicines are outlined above.   Tests Ordered: No orders of the defined types were placed in this encounter.   Medication Changes: No orders of the defined types were placed in this encounter.   Disposition:  Follow up in 2 week(s)  Signed, Shirlee More, MD  10/22/2018 1:56 PM    [facility]

## 2018-10-22 NOTE — Telephone Encounter (Signed)
°*  STAT* If patient is at the pharmacy, call can be transferred to refill team.   1. Which medications need to be refilled? (please list name of each medication and dose if known) furosemide (LASIX) 40 MG tablet   2. Which pharmacy/location (including street and city if local pharmacy) is medication to be sent to?  [facility] [place], [place] - [address] AT [place] [phone] (Phone) [phone] (Fax)     3. Do they need a 30 day or 90 day supply? 90 day

## 2018-10-24 LAB — CBC
Hematocrit: 34 % (ref 34.0–46.6)
Hemoglobin: 10.9 g/dL — ABNORMAL LOW (ref 11.1–15.9)
MCH: 28.1 pg (ref 26.6–33.0)
MCHC: 32.1 g/dL (ref 31.5–35.7)
MCV: 88 fL (ref 79–97)
Platelets: 167 10*3/uL (ref 150–450)
RBC: 3.88 x10E6/uL (ref 3.77–5.28)
RDW: 13.8 % (ref 11.7–15.4)
WBC: 4.4 10*3/uL (ref 3.4–10.8)

## 2018-10-24 LAB — COMPREHENSIVE METABOLIC PANEL
ALT: 7 IU/L (ref 0–32)
AST: 15 IU/L (ref 0–40)
Albumin/Globulin Ratio: 1.1 — ABNORMAL LOW (ref 1.2–2.2)
Albumin: 4.1 g/dL (ref 3.7–4.7)
Alkaline Phosphatase: 46 IU/L (ref 39–117)
BUN/Creatinine Ratio: 9 — ABNORMAL LOW (ref 12–28)
BUN: 19 mg/dL (ref 8–27)
Bilirubin Total: 0.7 mg/dL (ref 0.0–1.2)
CO2: 19 mmol/L — ABNORMAL LOW (ref 20–29)
Calcium: 9 mg/dL (ref 8.7–10.3)
Chloride: 100 mmol/L (ref 96–106)
Creatinine, Ser: 2.23 mg/dL — ABNORMAL HIGH (ref 0.57–1.00)
GFR calc Af Amer: 25 mL/min/{1.73_m2} — ABNORMAL LOW (ref >59)
GFR calc non Af Amer: 22 mL/min/{1.73_m2} — ABNORMAL LOW (ref >59)
Globulin, Total: 3.6 g/dL (ref 1.5–4.5)
Glucose: 59 mg/dL — ABNORMAL LOW (ref 65–99)
Potassium: 3.4 mmol/L — ABNORMAL LOW (ref 3.5–5.2)
Sodium: 139 mmol/L (ref 134–144)
Total Protein: 7.7 g/dL (ref 6.0–8.5)

## 2018-10-24 LAB — TSH: TSH: 3.82 u[IU]/mL (ref 0.450–4.500)

## 2018-10-24 LAB — PRO B NATRIURETIC PEPTIDE: NT-Pro BNP: 1732 pg/mL — ABNORMAL HIGH (ref 0–301)

## 2018-10-26 ENCOUNTER — Telehealth: Payer: Self-pay

## 2018-10-26 ENCOUNTER — Encounter: Payer: Self-pay | Admitting: Cardiology

## 2018-10-26 DIAGNOSIS — E876 Hypokalemia: Secondary | ICD-10-CM | POA: Insufficient documentation

## 2018-10-26 DIAGNOSIS — I119 Hypertensive heart disease without heart failure: Secondary | ICD-10-CM

## 2018-10-26 DIAGNOSIS — N184 Chronic kidney disease, stage 4 (severe): Secondary | ICD-10-CM

## 2018-10-26 DIAGNOSIS — E119 Type 2 diabetes mellitus without complications: Secondary | ICD-10-CM

## 2018-10-26 MED ORDER — POTASSIUM CHLORIDE ER 10 MEQ PO TBCR: 10.0000 meq | EXTENDED_RELEASE_TABLET | Freq: Every day | ORAL | 1 refills | Status: DC

## 2018-10-26 NOTE — Telephone Encounter (Signed)
-----   Message from Richardo Priest, MD sent at 10/26/2018  7:43 AM EDT ----- Stable but severe CKD ,on amiodarone, start KCL 10 meq tab daily #90 refill 1 and recheck 3 weeks BMP

## 2018-10-26 NOTE — Telephone Encounter (Signed)
Patient informed of results.  Rx for potassium 28meq once daily sent to pharmacy.  Patient instructed to recheck BMP in 3 weeks.  Patient agreed to plan and verbalized understanding.

## 2018-11-05 ENCOUNTER — Other Ambulatory Visit: Payer: Self-pay | Admitting: Cardiology

## 2018-11-05 ENCOUNTER — Other Ambulatory Visit: Payer: Self-pay

## 2018-11-05 ENCOUNTER — Encounter: Payer: Self-pay | Admitting: Cardiology

## 2018-11-05 ENCOUNTER — Telehealth (INDEPENDENT_AMBULATORY_CARE_PROVIDER_SITE_OTHER): Payer: Medicare Other | Admitting: Cardiology

## 2018-11-05 VITALS — BP 174/60 | HR 96 | Ht 66.0 in | Wt 160.0 lb

## 2018-11-05 DIAGNOSIS — I480 Paroxysmal atrial fibrillation: Secondary | ICD-10-CM

## 2018-11-05 DIAGNOSIS — Z7901 Long term (current) use of anticoagulants: Secondary | ICD-10-CM

## 2018-11-05 DIAGNOSIS — I5032 Chronic diastolic (congestive) heart failure: Secondary | ICD-10-CM

## 2018-11-05 DIAGNOSIS — Z79899 Other long term (current) drug therapy: Secondary | ICD-10-CM

## 2018-11-05 DIAGNOSIS — I110 Hypertensive heart disease with heart failure: Secondary | ICD-10-CM

## 2018-11-05 DIAGNOSIS — N184 Chronic kidney disease, stage 4 (severe): Secondary | ICD-10-CM

## 2018-11-05 MED ORDER — FUROSEMIDE 40 MG PO TABS: 20.0000 mg | ORAL_TABLET | Freq: Every day | ORAL | Status: DC

## 2018-11-05 MED ORDER — AMIODARONE HCL 200 MG PO TABS: ORAL_TABLET | ORAL | Status: AC

## 2018-11-05 NOTE — Patient Instructions (Signed)
Medication Instructions:  Your physician has recommended you make the following change in your medication:   DECREASE amiodarone (pacerone) 200 mg: Take 1 tablet daily on Monday, Wednesday, and Friday only.  DECREASE furosemide (lasix) 40 mg: Take 0.5 tablet (20 mg) daily.   STOP potassium chloride STOP ezetimibe (zetia)   If you need a refill on your cardiac medications before your next appointment, please call your pharmacy.   Lab work: None  If you have labs (blood work) drawn today and your tests are completely normal, you will receive your results only by: Marland Kitchen MyChart Message (if you have MyChart) OR . A paper copy in the mail If you have any lab test that is abnormal or we need to change your treatment, we will call you to review the results.  Testing/Procedures: None  Follow-Up: At [facility], you and your health needs are our priority.  As part of our continuing mission to provide you with exceptional heart care, we have created designated Provider Care Teams.  These Care Teams include your primary Cardiologist (physician) and Advanced Practice Providers (APPs -  Physician Assistants and Nurse Practitioners) who all work together to provide you with the care you need, when you need it.  Call our office on Wednesday of next week to let us know how you are doing. At this time, Dr. Bettina Gavia will decide when you should be scheduled for a follow up appointment.

## 2018-11-05 NOTE — Progress Notes (Signed)
Virtual Visit via Telephone Note   This visit type was conducted due to national recommendations for restrictions regarding the COVID-19 Pandemic (e.g. social distancing) in an effort to limit this patient's exposure and mitigate transmission in our community.  Due to her co-morbid illnesses, this patient is at least at moderate risk for complications without adequate follow up.  This format is felt to be most appropriate for this patient at this time.  The patient did not have access to video technology/had technical difficulties with video requiring transitioning to audio format only (telephone).  All issues noted in this document were discussed and addressed.  No physical exam could be performed with this format.  Please refer to the patient's chart for her  consent to telehealth for [facility].   Evaluation Performed:  Follow-up visit  Date:  11/05/2018   ID:  Jade Mclean, DOB May 12, 1946, MRN [id number]  Patient Location: Home Provider Location: Home  PCP:  Jade Greenhouse, MD  Cardiologist:  No primary care provider on file. Dr Jade Mclean Electrophysiologist:  None   Chief Complaint: Follow-up after recently initiating anticoagulation  History of Present Illness:    Jade Mclean is a 73 y.o. adult with a hx of PAF heart failure T2 DM hypertension stage 4 CKD and GI bleed with active PUD with anticoagulation for PE referred by Dr Jade Mclean. She had demand ischemia form anemia requiring 4 units of PRBC  In the last few weeks we reinitiated anticoagulation and she has had no GI bleeding and recent labs show stable CKD and hemoglobin greater than 10.  Unfortunately she has not improved she tells me she is lost her appetite she attributes it to the multiple medications she is taking and initially told me she wanted to stop all of them.  Fortunately I was able to talk her into a program where we will decrease and discontinue medications that are optional reducing amiodarone 50% reducing  furosemide to 50% stopping potassium and discontinued Zetia.  If unimproved O my office in 1 week.  In particular she needs to remain anticoagulated with massive pulmonary embolism unprovoked.  Fortunately she has no edema her weight continues to fall at home and she is not short of breath and has had no chest pain palpitations syncope or bleeding.  The patient does not have symptoms concerning for COVID-19 infection (fever, chills, cough, or new shortness of breath).    Past Medical History:  Diagnosis Date   Acute blood loss anemia    Benign hypertension 08/16/2015   CHF (congestive heart failure) (HCC)    Chronic kidney disease, stage 3 ([place]) 02/20/2016   Chronic rheumatic arthritis ([place]) 08/16/2015   Fatigue 05/22/2018   GI bleeding 07/05/2018   Mixed hyperlipidemia 08/16/2015   Obesity (BMI 30-39.9) 09/18/2017   Osteoporosis 02/20/2016   2010: -2.1 2014: -1.8 2018: no fracture, no treatment 2019: -1.2   Type 2 diabetes mellitus without complication, without long-term current use of insulin ([place]) 08/16/2015   Past Surgical History:  Procedure Laterality Date   ESOPHAGOGASTRODUODENOSCOPY (EGD) WITH PROPOFOL N/A 07/05/2018   Procedure: ESOPHAGOGASTRODUODENOSCOPY (EGD) WITH PROPOFOL;  Surgeon: Jade Craver, MD;  Location: [facility] ENDOSCOPY;  Service: Endoscopy;  Laterality: N/A;   KNEE SURGERY Right      Current Meds  Medication Sig   amiodarone (PACERONE) 200 MG tablet Take 1 tablet (200 mg total) by mouth daily.   amLODipine (NORVASC) 10 MG tablet Take 1 tablet (10 mg total) by mouth daily.   apixaban (ELIQUIS) 5  MG TABS tablet Take 1 tablet (5 mg total) by mouth 2 (two) times daily.   Calcium Carbonate-Vitamin D 600-400 MG-UNIT chew tablet Chew 1 tablet by mouth daily.   cloNIDine (CATAPRES - DOSED IN MG/24 HR) 0.2 mg/24hr patch Place 1 patch (0.2 mg total) onto the skin every Sunday.   etanercept (ENBREL) 25 MG injection Inject 25 mg into the skin every Friday.   ezetimibe  (ZETIA) 10 MG tablet Take 10 mg by mouth daily.   famotidine (PEPCID) 40 MG tablet Take 1 tablet (40 mg total) by mouth daily.   furosemide (LASIX) 40 MG tablet Take 1 tablet (40 mg total) by mouth daily.   hydrALAZINE (APRESOLINE) 50 MG tablet Take 1 tablet (50 mg total) by mouth 4 (four) times daily.   insulin degludec (TRESIBA) 100 UNIT/ML SOPN FlexTouch Pen Inject 12 Units into the skin daily.    labetalol (NORMODYNE) 100 MG tablet Take 1 tablet (100 mg total) by mouth 2 (two) times daily.   Multiple Vitamin (MULTIVITAMIN WITH MINERALS) TABS tablet Take 1 tablet by mouth daily.   pantoprazole (PROTONIX) 40 MG tablet Take 1 tablet (40 mg total) by mouth daily.   potassium chloride (K-DUR) 10 MEQ tablet Take 1 tablet (10 mEq total) by mouth daily.     Allergies:   Colesevelam; Dulaglutide; Nsaids; Rosuvastatin; and Simvastatin   Social History   Tobacco Use   Smoking status: Never Smoker   Smokeless tobacco: Never Used  Substance Use Topics   Alcohol use: Never    Frequency: Never   Drug use: Never     Family Hx: The patient's family history includes Diabetes in her brother, mother, and sister; Heart attack in her brother; Tuberculosis in her father.  ROS:   Please see the history of present illness.     All other systems reviewed and are negative.   Prior CV studies:   The following studies were reviewed today:    Labs/Other Tests and Data Reviewed:    EKG:  No ECG reviewed.  Recent Labs: 10/23/2018: ALT 7; BUN 19; Creatinine, Ser 2.23; Hemoglobin 10.9; NT-Pro BNP 1,732; Platelets 167; Potassium 3.4; Sodium 139; TSH 3.820   Recent Lipid Panel No results found for: CHOL, TRIG, HDL, CHOLHDL, LDLCALC, LDLDIRECT  Wt Readings from Last 3 Encounters:  11/05/18 160 lb (72.6 kg)  10/22/18 177 lb (80.3 kg)  10/08/18 209 lb (94.8 kg)     Objective:    Vital Signs:  BP (!) 174/60 (BP Location: Right Arm, Patient Position: Sitting)    Pulse 96    Ht 5\' 6"   (1.676 m)    Wt 160 lb (72.6 kg)    BMI 25.82 kg/m    VITAL SIGNS:  reviewed she was somewhat despondent discouraged but alert and oriented no respiratory distress.  ASSESSMENT & PLAN:    1. Heart failure chronic diastolic with hypertensive heart disease stable weight continues to fall reduce her diuretic and next time she is seen physically recheck labs including proBNP 2. Atrial fibrillation paroxysmal decrease dose of amiodarone will need an EKG at next office visit recent liver function TSH were normal. 3. Continue amiodarone reduced dose 4. Continue anticoagulation 5. Stable CKD on recent labs  COVID-19 Education: The signs and symptoms of COVID-19 were discussed with the patient and how to seek care for testing (follow up with PCP or arrange E-visit).  The importance of social distancing was discussed today.  Time:   Today, I have spent 23 minutes with the  patient with telehealth technology discussing the above problems.     Medication Adjustments/Labs and Tests Ordered: Current medicines are reviewed at length with the patient today.  Concerns regarding medicines are outlined above.   Tests Ordered: No orders of the defined types were placed in this encounter.   Medication Changes: No orders of the defined types were placed in this encounter.   Disposition:  Follow up depending on response to med change  Signed, Shirlee More, MD  11/05/2018 2:17 PM    [facility]

## 2018-11-05 NOTE — Addendum Note (Signed)
Addended by: Austin Miles on: 11/05/2018 02:46 PM   Modules accepted: Orders

## 2018-11-11 ENCOUNTER — Telehealth: Payer: Self-pay | Admitting: Cardiology

## 2018-11-11 MED ORDER — CLONIDINE HCL 0.1 MG PO TABS: 0.2000 mg | ORAL_TABLET | Freq: Two times a day (BID) | ORAL | 3 refills | Status: DC

## 2018-11-11 NOTE — Telephone Encounter (Signed)
Patient called to report her recent BP and HR readings: 5/1 -- 190/70  96 5/2 -- 185/62  96 5/3 -- 173/63  95 5/4 -- 173/55 94 5/5 -- 169/64  95   Patient can be reached at [phone]

## 2018-11-11 NOTE — Telephone Encounter (Signed)
Patient advised to stop using the clonidine patch and start taking clonidine 0.1 mg tablets. She is agreeable to taking 2 tablets twice daily per Dr. Bettina Gavia. Prescription has been sent to [facility] in [place] as requested.   Patient will continue to check her blood pressure for 2 weeks and call our office with these readings for Dr. Bettina Gavia to review.   Patient verbalized understanding. No further questions.

## 2018-11-11 NOTE — Telephone Encounter (Signed)
I had like her to do a stop her clonidine patch and start taking clonidine 0.1 mg 2 tablets twice daily dispense 90 days 2 refills and send [place] blood pressure checks in 2 weeks

## 2018-11-16 LAB — BASIC METABOLIC PANEL
BUN/Creatinine Ratio: 11 — ABNORMAL LOW (ref 12–28)
BUN: 24 mg/dL (ref 8–27)
CO2: 21 mmol/L (ref 20–29)
Calcium: 9.1 mg/dL (ref 8.7–10.3)
Chloride: 102 mmol/L (ref 96–106)
Creatinine, Ser: 2.14 mg/dL — ABNORMAL HIGH (ref 0.57–1.00)
GFR calc Af Amer: 26 mL/min/{1.73_m2} — ABNORMAL LOW (ref >59)
GFR calc non Af Amer: 23 mL/min/{1.73_m2} — ABNORMAL LOW (ref >59)
Glucose: 94 mg/dL (ref 65–99)
Potassium: 3.3 mmol/L — ABNORMAL LOW (ref 3.5–5.2)
Sodium: 139 mmol/L (ref 134–144)

## 2018-11-19 ENCOUNTER — Telehealth: Payer: Self-pay | Admitting: Cardiology

## 2018-11-19 NOTE — Telephone Encounter (Signed)
Pt reporting difficulty holding food down for 2 days. She throws up when she eats. She believes this is due to her BP meds, because she's on so many of them. For 3 months she's had no appetite.     She says the top of her stomach has been hurting. She takes protonix for reflux but stopped taking it yesterday due to vomiting.      She saw her PCP on Tuesday who told her to call her cardiologist. She said he drew labs and did nothing else.        BP's have been 179/80, HR 95, Yesterday 169/70, HR 96, Wednesday 169/55, HR 96. Denies swelling, shortness of breath or chest pain/pressure or tightness.     Her blood sugar today is 105.      pls advise, tx

## 2018-11-19 NOTE — Telephone Encounter (Signed)
Patient called and stated she has been throwing up for the past 2 days and thinks it is because the BP medicine she was put on.  Please call patient to discuss

## 2018-11-20 ENCOUNTER — Telehealth: Payer: Self-pay | Admitting: *Deleted

## 2018-11-20 DIAGNOSIS — I480 Paroxysmal atrial fibrillation: Secondary | ICD-10-CM

## 2018-11-20 DIAGNOSIS — I5032 Chronic diastolic (congestive) heart failure: Secondary | ICD-10-CM

## 2018-11-20 DIAGNOSIS — N184 Chronic kidney disease, stage 4 (severe): Secondary | ICD-10-CM

## 2018-11-20 DIAGNOSIS — I110 Hypertensive heart disease with heart failure: Secondary | ICD-10-CM

## 2018-11-20 NOTE — Telephone Encounter (Signed)
Left vm for patient to call back to schedule virtual visit today.

## 2018-11-20 NOTE — Telephone Encounter (Signed)
Please check with the patient in detail as to what is going on.  I would like to do tele-visit today.

## 2018-11-20 NOTE — Telephone Encounter (Signed)
-----   Message from Jenean Lindau, MD sent at 11/17/2018  9:09 AM EDT ----- Renal function is stable.  Patient can take a banana 2 or 3 times a week to mildly increase potassium level.  He should not overdo it because it may increase his potassium dangerously.  Recheck in 1 week. Jenean Lindau, MD 11/17/2018 9:08 AM

## 2018-11-20 NOTE — Telephone Encounter (Signed)
Phoned patient to follow-up on scheduling appt with Dr. Geraldo Pitter today. States she's feeling better today and has not thrown up. She agrees to a phone visit, does not have computer or smart phone. Appt being scheduled for this visit now.

## 2018-11-20 NOTE — Telephone Encounter (Signed)
Telephone call to patient. Informed of BMP lab results and recommendations of eating a banana 2-3 times a week and to recheck BMP in 1 week. Patient verbalized understanding

## 2018-11-20 NOTE — Telephone Encounter (Signed)
Patient is really not doing any better than last appt on 11/05/18. Patient declined appt today with Dr. Docia Furl and requested to be scheduled with Dr. Angela Adam. Scheduled for virtual visit with no further questions.

## 2018-11-25 LAB — BASIC METABOLIC PANEL
BUN/Creatinine Ratio: 10 — ABNORMAL LOW (ref 12–28)
BUN: 18 mg/dL (ref 8–27)
CO2: 20 mmol/L (ref 20–29)
Calcium: 8.8 mg/dL (ref 8.7–10.3)
Chloride: 105 mmol/L (ref 96–106)
Creatinine, Ser: 1.86 mg/dL — ABNORMAL HIGH (ref 0.57–1.00)
GFR calc Af Amer: 31 mL/min/{1.73_m2} — ABNORMAL LOW (ref >59)
GFR calc non Af Amer: 27 mL/min/{1.73_m2} — ABNORMAL LOW (ref >59)
Glucose: 154 mg/dL — ABNORMAL HIGH (ref 65–99)
Potassium: 3.4 mmol/L — ABNORMAL LOW (ref 3.5–5.2)
Sodium: 142 mmol/L (ref 134–144)

## 2018-11-26 ENCOUNTER — Telehealth: Payer: Self-pay | Admitting: *Deleted

## 2018-11-26 ENCOUNTER — Encounter: Payer: Self-pay | Admitting: Cardiology

## 2018-11-26 ENCOUNTER — Telehealth (INDEPENDENT_AMBULATORY_CARE_PROVIDER_SITE_OTHER): Payer: Medicare Other | Admitting: Cardiology

## 2018-11-26 ENCOUNTER — Other Ambulatory Visit: Payer: Self-pay

## 2018-11-26 VITALS — BP 146/64 | HR 98 | Ht 66.0 in | Wt 166.0 lb

## 2018-11-26 DIAGNOSIS — Z79899 Other long term (current) drug therapy: Secondary | ICD-10-CM

## 2018-11-26 DIAGNOSIS — I110 Hypertensive heart disease with heart failure: Secondary | ICD-10-CM

## 2018-11-26 DIAGNOSIS — E876 Hypokalemia: Secondary | ICD-10-CM

## 2018-11-26 DIAGNOSIS — Z7901 Long term (current) use of anticoagulants: Secondary | ICD-10-CM

## 2018-11-26 DIAGNOSIS — N184 Chronic kidney disease, stage 4 (severe): Secondary | ICD-10-CM

## 2018-11-26 DIAGNOSIS — I5032 Chronic diastolic (congestive) heart failure: Secondary | ICD-10-CM

## 2018-11-26 DIAGNOSIS — I480 Paroxysmal atrial fibrillation: Secondary | ICD-10-CM

## 2018-11-26 NOTE — Patient Instructions (Addendum)
Medication Instructions:  Your physician recommends that you continue on your current medications as directed. Please refer to the Current Medication list given to you today.  If you need a refill on your cardiac medications before your next appointment, please call your pharmacy.   Lab work: None If you have labs (blood work) drawn today and your tests are completely normal, you will receive your results only by: Marland Kitchen MyChart Message (if you have MyChart) OR . A paper copy in the mail If you have any lab test that is abnormal or we need to change your treatment, we will call you to review the results.  Testing/Procedures: None  Follow-Up: At [facility], you and your health needs are our priority.  As part of our continuing mission to provide you with exceptional heart care, we have created designated Provider Care Teams.  These Care Teams include your primary Cardiologist (physician) and Advanced Practice Providers (APPs -  Physician Assistants and Nurse Practitioners) who all work together to provide you with the care you need, when you need it. You will need a follow up appointment in 3 months: Thursday, 03/04/2019, at 2:30 pm in the [place] office.

## 2018-11-26 NOTE — Telephone Encounter (Signed)
Telephone call to patient . Informed of BMP results and to continue potassium supplement per Dr. Bettina Gavia. Patient verbalized understanding

## 2018-11-26 NOTE — Progress Notes (Signed)
Virtual Visit via Telephone Note  She has no access to Internet or smart phone  This visit type was conducted due to national recommendations for restrictions regarding the COVID-19 Pandemic (e.g. social distancing) in an effort to limit this patient's exposure and mitigate transmission in our community.  Due to her co-morbid illnesses, this patient is at least at moderate risk for complications without adequate follow up.  This format is felt to be most appropriate for this patient at this time.  The patient did not have access to video technology/had technical difficulties with video requiring transitioning to audio format only (telephone).  All issues noted in this document were discussed and addressed.  No physical exam could be performed with this format.  Please refer to the patient's chart for her  consent to telehealth for [facility].   Date:  11/26/2018   ID:  Jade Mclean, DOB 1946/02/27, MRN [id number]  Patient Location: Home Provider Location: Office  PCP:  Algis Greenhouse, MD  Cardiologist:  Shirlee More, MD  Electrophysiologist:  None   Evaluation Performed:  Follow-Up Visit  Chief Complaint:  Televisit for blood pressure issues.   History of Present Illness:    Jade Mclean is a 73 y.o. female with history of PAF on amiodarone , heart failure, DM2, HTN, CKDIV, GI bleed with active PUD, PE on anticoagulation. Last office visit she requested to stop all of her medications. We were able to discuss and agree on a plan to decrease amidoraone 50%, furosemide 50%, stop potassium, stop Zetia and to continue her anticoagulation due to her  history of unprovoked PE. BP at Dr Garlon Hatchet office 11/17/18 175/80.  She has had complaints of uncontrolled HTN since her last OV. Her clonidine patch was stopped and changed to clonidine PO 0.1mg  BID tp allow for titration.  Presently on 0.2 mg twice daily I think her blood pressure today is at a good point with a goal of 1 02-5 50 systolic.   She is having no edema shortness of breath orthopnea palpitation or syncope.  Unfortunately she is sick to her stomach and has been vomiting clear material today no evidence of bleeding and not having abdominal pain.  The patient does not have symptoms concerning for COVID-19 infection (fever, chills, cough, or new shortness of breath).    Past Medical History:  Diagnosis Date   Acute blood loss anemia    Benign hypertension 08/16/2015   CHF (congestive heart failure) (HCC)    Chronic kidney disease, stage 3 ([place]) 02/20/2016   Chronic rheumatic arthritis ([place]) 08/16/2015   Fatigue 05/22/2018   GI bleeding 07/05/2018   Mixed hyperlipidemia 08/16/2015   Obesity (BMI 30-39.9) 09/18/2017   Osteoporosis 02/20/2016   2010: -2.1 2014: -1.8 2018: no fracture, no treatment 2019: -1.2   Type 2 diabetes mellitus without complication, without long-term current use of insulin ([place]) 08/16/2015   Past Surgical History:  Procedure Laterality Date   ESOPHAGOGASTRODUODENOSCOPY (EGD) WITH PROPOFOL N/A 07/05/2018   Procedure: ESOPHAGOGASTRODUODENOSCOPY (EGD) WITH PROPOFOL;  Surgeon: Juanita Craver, MD;  Location: [facility] ENDOSCOPY;  Service: Endoscopy;  Laterality: N/A;   KNEE SURGERY Right      Current Meds  Medication Sig   amiodarone (PACERONE) 200 MG tablet Take 1 tablet (200 mg) daily on Monday, Wednesday, and Friday only.   amLODipine (NORVASC) 10 MG tablet Take 1 tablet (10 mg total) by mouth daily.   apixaban (ELIQUIS) 5 MG TABS tablet Take 1 tablet (5 mg total) by mouth 2 (two)  times daily.   Calcium Carbonate-Vitamin D 600-400 MG-UNIT chew tablet Chew 1 tablet by mouth daily.   cloNIDine (CATAPRES) 0.1 MG tablet Take 2 tablets (0.2 mg total) by mouth 2 (two) times daily.   etanercept (ENBREL) 25 MG injection Inject 25 mg into the skin every Friday.   famotidine (PEPCID) 40 MG tablet Take 1 tablet (40 mg total) by mouth daily.   furosemide (LASIX) 40 MG tablet TAKE 1 TABLET(40 MG) BY  MOUTH DAILY FLUID   hydrALAZINE (APRESOLINE) 50 MG tablet Take 1 tablet (50 mg total) by mouth 4 (four) times daily.   insulin degludec (TRESIBA) 100 UNIT/ML SOPN FlexTouch Pen Inject 12 Units into the skin daily.    labetalol (NORMODYNE) 100 MG tablet Take 1 tablet (100 mg total) by mouth 2 (two) times daily.   Multiple Vitamin (MULTIVITAMIN WITH MINERALS) TABS tablet Take 1 tablet by mouth daily.   pantoprazole (PROTONIX) 40 MG tablet Take 1 tablet (40 mg total) by mouth daily.     Allergies:   Colesevelam; Dulaglutide; Nsaids; Rosuvastatin; and Simvastatin   Social History   Tobacco Use   Smoking status: Never Smoker   Smokeless tobacco: Never Used  Substance Use Topics   Alcohol use: Never    Frequency: Never   Drug use: Never     Family Hx: The patient's family history includes Diabetes in her brother, mother, and sister; Heart attack in her brother; Tuberculosis in her father.  ROS:   Please see the history of present illness.     All other systems reviewed and are negative.   Prior CV studies:   The following studies were reviewed today:    Labs/Other Tests and Data Reviewed:     Recent Labs: 10/23/2018: ALT 7; Hemoglobin 10.9; NT-Pro BNP 1,732; Platelets 167; TSH 3.820 11/24/2018: BUN 18; Creatinine, Ser 1.86; Potassium 3.4; Sodium 142   Recent Lipid Panel 11/17/18 Chol 200, LDL128 HDL 44  Wt Readings from Last 3 Encounters:  11/26/18 166 lb (75.3 kg)  11/05/18 160 lb (72.6 kg)  10/22/18 177 lb (80.3 kg)     Objective:    Vital Signs:  BP (!) 146/64 (BP Location: Left Arm, Patient Position: Sitting)    Pulse 98    Ht 5\' 6"  (1.676 m)    Wt 166 lb (75.3 kg)    BMI 26.79 kg/m    VITAL SIGNS:  reviewed her mood and affect thought and cognition are normal she is alert and oriented x3 no audible wheezing or respiratory distress  ASSESSMENT & PLAN:    1. Atrial fibrillation paroxysmal stable continue low-dose amiodarone 2. Chronic anticoagulation  stable because of massive pulmonary embolism we will keep her on full dose anticoagulant for 6 months and reduce to half dose afterwards with unprovoked unmodifiable event as a precipitant 3. On amiodarone continue low dosage next visit check thyroid liver function.  She has no evidence of toxicity.   4. Hypertensive heart disease improved she has no fluid overload on the lower dose of diuretic and her blood pressure is at a reasonable point on multiple drugs including increased clonidine. 5. Chronic kidney disease stable 6. Hypokalemia is mild she is so close to normal at this time I would not give her potassium supplements with her GI upset.  I did ask her to hold her diuretic until her nausea and vomiting resolves  COVID-19 Education: The signs and symptoms of COVID-19 were discussed with the patient and how to seek care for testing (follow up  with PCP or arrange E-visit).  The importance of social distancing was discussed today.  Time:   Today, I have spent 20 minutes with the patient with telehealth technology discussing the above problems.     Medication Adjustments/Labs and Tests Ordered: Current medicines are reviewed at length with the patient today.  Concerns regarding medicines are outlined above.   Tests Ordered: No orders of the defined types were placed in this encounter.   Medication Changes: No orders of the defined types were placed in this encounter.   Disposition:  Follow up in 3 month(s)  Signed, Shirlee More, MD  11/26/2018 3:57 PM    Will

## 2018-11-26 NOTE — Telephone Encounter (Signed)
-----   Message from Austin Miles, RN sent at 11/26/2018  8:40 AM EDT -----  ----- Message ----- From: Richardo Priest, MD Sent: 11/25/2018   9:44 AM EDT To: Stevan Born, CMA  Normal or stable result  Her renal function actually is somewhat improved her potassium remains low and I like her to stay on her potassium supplement but I am not can increase the dose with stage IV CKD

## 2018-11-27 ENCOUNTER — Telehealth: Payer: Self-pay | Admitting: Cardiology

## 2018-11-27 NOTE — Telephone Encounter (Signed)
Phoned patient's niece Beckie Busing who states that patient is lethargic and sleepy. States patient hasn't been vomiting recently but is nauseous all the time. She did not take any medicine last week, Beckie Busing believes because of nausea but believes she is taking meds now.   Seems that medications may be making her nauseaus because she doesn't eat enough, perhaps a few crackers or bites of food.  BG yesterday was 129 and BP 147/64. No lower extremity edema. Beckie Busing is going to see her aunt this afternoon and will call us back with further assessment information.

## 2018-11-27 NOTE — Telephone Encounter (Signed)
Phoned patient who states she saw Dr. Garlon Hatchet recently and he believes she has a stomach virus. Dr. Bettina Gavia made aware of patient's current level of consciousness and nausea (see previous entry) and doesn't feel that this is cardiac related. Instructed patient to follow up with PCP Dr. Garlon Hatchet. She verbalizes understanding and has no further questions.     Will inform Monique when she calls back to have pt f/u with Dr. Garlon Hatchet

## 2018-11-27 NOTE — Telephone Encounter (Signed)
Gordy Councilman, patient's niece called wanting to discuss patient's current state -- she states patient is very sleepy and wants to sleep a lot.  Says she has been vomiting a lot.  Wants to know if it can be related to medicine changes Please call Monique to discuss.  She is on [place] [phone]

## 2018-11-28 ENCOUNTER — Other Ambulatory Visit: Payer: Self-pay | Admitting: Cardiology

## 2018-12-18 DIAGNOSIS — E860 Dehydration: Secondary | ICD-10-CM

## 2018-12-18 DIAGNOSIS — I361 Nonrheumatic tricuspid (valve) insufficiency: Secondary | ICD-10-CM | POA: Diagnosis not present

## 2018-12-18 DIAGNOSIS — I517 Cardiomegaly: Secondary | ICD-10-CM | POA: Diagnosis not present

## 2018-12-18 DIAGNOSIS — N390 Urinary tract infection, site not specified: Secondary | ICD-10-CM | POA: Diagnosis not present

## 2018-12-18 DIAGNOSIS — K529 Noninfective gastroenteritis and colitis, unspecified: Secondary | ICD-10-CM

## 2018-12-18 DIAGNOSIS — N179 Acute kidney failure, unspecified: Secondary | ICD-10-CM

## 2018-12-18 DIAGNOSIS — I10 Essential (primary) hypertension: Secondary | ICD-10-CM

## 2018-12-19 DIAGNOSIS — K529 Noninfective gastroenteritis and colitis, unspecified: Secondary | ICD-10-CM | POA: Diagnosis not present

## 2018-12-19 DIAGNOSIS — I361 Nonrheumatic tricuspid (valve) insufficiency: Secondary | ICD-10-CM | POA: Diagnosis not present

## 2018-12-19 DIAGNOSIS — E860 Dehydration: Secondary | ICD-10-CM | POA: Diagnosis not present

## 2018-12-19 DIAGNOSIS — N390 Urinary tract infection, site not specified: Secondary | ICD-10-CM | POA: Diagnosis not present

## 2018-12-19 DIAGNOSIS — N179 Acute kidney failure, unspecified: Secondary | ICD-10-CM | POA: Diagnosis not present

## 2018-12-19 DIAGNOSIS — I4891 Unspecified atrial fibrillation: Secondary | ICD-10-CM | POA: Diagnosis not present

## 2018-12-20 DIAGNOSIS — N179 Acute kidney failure, unspecified: Secondary | ICD-10-CM | POA: Diagnosis not present

## 2018-12-20 DIAGNOSIS — E860 Dehydration: Secondary | ICD-10-CM | POA: Diagnosis not present

## 2018-12-20 DIAGNOSIS — K529 Noninfective gastroenteritis and colitis, unspecified: Secondary | ICD-10-CM | POA: Diagnosis not present

## 2018-12-20 DIAGNOSIS — N390 Urinary tract infection, site not specified: Secondary | ICD-10-CM | POA: Diagnosis not present

## 2018-12-21 DIAGNOSIS — N390 Urinary tract infection, site not specified: Secondary | ICD-10-CM | POA: Diagnosis not present

## 2018-12-21 DIAGNOSIS — K529 Noninfective gastroenteritis and colitis, unspecified: Secondary | ICD-10-CM | POA: Diagnosis not present

## 2018-12-21 DIAGNOSIS — N179 Acute kidney failure, unspecified: Secondary | ICD-10-CM | POA: Diagnosis not present

## 2018-12-21 DIAGNOSIS — E860 Dehydration: Secondary | ICD-10-CM | POA: Diagnosis not present

## 2018-12-22 DIAGNOSIS — N390 Urinary tract infection, site not specified: Secondary | ICD-10-CM | POA: Diagnosis not present

## 2018-12-22 DIAGNOSIS — N179 Acute kidney failure, unspecified: Secondary | ICD-10-CM | POA: Diagnosis not present

## 2018-12-22 DIAGNOSIS — E860 Dehydration: Secondary | ICD-10-CM | POA: Diagnosis not present

## 2018-12-22 DIAGNOSIS — K529 Noninfective gastroenteritis and colitis, unspecified: Secondary | ICD-10-CM | POA: Diagnosis not present

## 2018-12-23 DIAGNOSIS — N179 Acute kidney failure, unspecified: Secondary | ICD-10-CM | POA: Diagnosis not present

## 2018-12-23 DIAGNOSIS — N390 Urinary tract infection, site not specified: Secondary | ICD-10-CM | POA: Diagnosis not present

## 2018-12-23 DIAGNOSIS — K529 Noninfective gastroenteritis and colitis, unspecified: Secondary | ICD-10-CM | POA: Diagnosis not present

## 2018-12-23 DIAGNOSIS — E860 Dehydration: Secondary | ICD-10-CM | POA: Diagnosis not present

## 2018-12-24 DIAGNOSIS — K529 Noninfective gastroenteritis and colitis, unspecified: Secondary | ICD-10-CM | POA: Diagnosis not present

## 2018-12-24 DIAGNOSIS — N179 Acute kidney failure, unspecified: Secondary | ICD-10-CM | POA: Diagnosis not present

## 2018-12-24 DIAGNOSIS — E860 Dehydration: Secondary | ICD-10-CM | POA: Diagnosis not present

## 2018-12-24 DIAGNOSIS — N390 Urinary tract infection, site not specified: Secondary | ICD-10-CM | POA: Diagnosis not present

## 2018-12-25 DIAGNOSIS — N179 Acute kidney failure, unspecified: Secondary | ICD-10-CM | POA: Diagnosis not present

## 2018-12-25 DIAGNOSIS — E860 Dehydration: Secondary | ICD-10-CM | POA: Diagnosis not present

## 2018-12-25 DIAGNOSIS — K529 Noninfective gastroenteritis and colitis, unspecified: Secondary | ICD-10-CM | POA: Diagnosis not present

## 2018-12-25 DIAGNOSIS — N390 Urinary tract infection, site not specified: Secondary | ICD-10-CM | POA: Diagnosis not present

## 2018-12-26 ENCOUNTER — Encounter (HOSPITAL_COMMUNITY): Payer: Self-pay | Admitting: Family Medicine

## 2018-12-26 ENCOUNTER — Inpatient Hospital Stay (HOSPITAL_COMMUNITY)
Admission: AD | Admit: 2018-12-26 | Discharge: 2019-01-06 | DRG: 683 | Disposition: A | Discharge status: Hospice | Payer: Medicare Other | Source: Other Acute Inpatient Hospital | Attending: Family Medicine | Admitting: Family Medicine

## 2018-12-26 DIAGNOSIS — I774 Celiac artery compression syndrome: Secondary | ICD-10-CM | POA: Diagnosis present

## 2018-12-26 DIAGNOSIS — Z831 Family history of other infectious and parasitic diseases: Secondary | ICD-10-CM | POA: Diagnosis not present

## 2018-12-26 DIAGNOSIS — Z8249 Family history of ischemic heart disease and other diseases of the circulatory system: Secondary | ICD-10-CM | POA: Diagnosis not present

## 2018-12-26 DIAGNOSIS — Z888 Allergy status to other drugs, medicaments and biological substances status: Secondary | ICD-10-CM

## 2018-12-26 DIAGNOSIS — Z79899 Other long term (current) drug therapy: Secondary | ICD-10-CM

## 2018-12-26 DIAGNOSIS — I130 Hypertensive heart and chronic kidney disease with heart failure and stage 1 through stage 4 chronic kidney disease, or unspecified chronic kidney disease: Secondary | ICD-10-CM | POA: Diagnosis present

## 2018-12-26 DIAGNOSIS — Z9049 Acquired absence of other specified parts of digestive tract: Secondary | ICD-10-CM | POA: Diagnosis not present

## 2018-12-26 DIAGNOSIS — Z1159 Encounter for screening for other viral diseases: Secondary | ICD-10-CM

## 2018-12-26 DIAGNOSIS — I480 Paroxysmal atrial fibrillation: Secondary | ICD-10-CM | POA: Diagnosis present

## 2018-12-26 DIAGNOSIS — E860 Dehydration: Secondary | ICD-10-CM | POA: Diagnosis not present

## 2018-12-26 DIAGNOSIS — Z6831 Body mass index (BMI) 31.0-31.9, adult: Secondary | ICD-10-CM

## 2018-12-26 DIAGNOSIS — E1122 Type 2 diabetes mellitus with diabetic chronic kidney disease: Secondary | ICD-10-CM | POA: Diagnosis present

## 2018-12-26 DIAGNOSIS — E119 Type 2 diabetes mellitus without complications: Secondary | ICD-10-CM

## 2018-12-26 DIAGNOSIS — L89322 Pressure ulcer of left buttock, stage 2: Secondary | ICD-10-CM | POA: Diagnosis present

## 2018-12-26 DIAGNOSIS — D649 Anemia, unspecified: Secondary | ICD-10-CM | POA: Diagnosis not present

## 2018-12-26 DIAGNOSIS — N184 Chronic kidney disease, stage 4 (severe): Secondary | ICD-10-CM | POA: Diagnosis present

## 2018-12-26 DIAGNOSIS — K8309 Other cholangitis: Secondary | ICD-10-CM | POA: Diagnosis present

## 2018-12-26 DIAGNOSIS — K279 Peptic ulcer, site unspecified, unspecified as acute or chronic, without hemorrhage or perforation: Secondary | ICD-10-CM | POA: Diagnosis present

## 2018-12-26 DIAGNOSIS — M810 Age-related osteoporosis without current pathological fracture: Secondary | ICD-10-CM | POA: Diagnosis present

## 2018-12-26 DIAGNOSIS — R1084 Generalized abdominal pain: Secondary | ICD-10-CM | POA: Diagnosis not present

## 2018-12-26 DIAGNOSIS — I509 Heart failure, unspecified: Secondary | ICD-10-CM | POA: Diagnosis present

## 2018-12-26 DIAGNOSIS — N183 Chronic kidney disease, stage 3 (moderate): Secondary | ICD-10-CM | POA: Diagnosis not present

## 2018-12-26 DIAGNOSIS — I10 Essential (primary) hypertension: Secondary | ICD-10-CM | POA: Diagnosis not present

## 2018-12-26 DIAGNOSIS — N179 Acute kidney failure, unspecified: Secondary | ICD-10-CM | POA: Diagnosis present

## 2018-12-26 DIAGNOSIS — E8770 Fluid overload, unspecified: Secondary | ICD-10-CM

## 2018-12-26 DIAGNOSIS — M069 Rheumatoid arthritis, unspecified: Secondary | ICD-10-CM | POA: Diagnosis present

## 2018-12-26 DIAGNOSIS — E872 Acidosis: Secondary | ICD-10-CM | POA: Diagnosis present

## 2018-12-26 DIAGNOSIS — Z86711 Personal history of pulmonary embolism: Secondary | ICD-10-CM | POA: Diagnosis not present

## 2018-12-26 DIAGNOSIS — I160 Hypertensive urgency: Secondary | ICD-10-CM | POA: Diagnosis not present

## 2018-12-26 DIAGNOSIS — E785 Hyperlipidemia, unspecified: Secondary | ICD-10-CM | POA: Diagnosis present

## 2018-12-26 DIAGNOSIS — F05 Delirium due to known physiological condition: Secondary | ICD-10-CM | POA: Diagnosis present

## 2018-12-26 DIAGNOSIS — Z833 Family history of diabetes mellitus: Secondary | ICD-10-CM

## 2018-12-26 DIAGNOSIS — Z5329 Procedure and treatment not carried out because of patient's decision for other reasons: Secondary | ICD-10-CM | POA: Diagnosis present

## 2018-12-26 DIAGNOSIS — E669 Obesity, unspecified: Secondary | ICD-10-CM | POA: Diagnosis present

## 2018-12-26 DIAGNOSIS — N170 Acute kidney failure with tubular necrosis: Principal | ICD-10-CM | POA: Diagnosis present

## 2018-12-26 DIAGNOSIS — D509 Iron deficiency anemia, unspecified: Secondary | ICD-10-CM | POA: Diagnosis present

## 2018-12-26 DIAGNOSIS — Z794 Long term (current) use of insulin: Secondary | ICD-10-CM

## 2018-12-26 DIAGNOSIS — R001 Bradycardia, unspecified: Secondary | ICD-10-CM | POA: Diagnosis not present

## 2018-12-26 DIAGNOSIS — D62 Acute posthemorrhagic anemia: Secondary | ICD-10-CM | POA: Diagnosis present

## 2018-12-26 DIAGNOSIS — K529 Noninfective gastroenteritis and colitis, unspecified: Secondary | ICD-10-CM | POA: Diagnosis not present

## 2018-12-26 DIAGNOSIS — E782 Mixed hyperlipidemia: Secondary | ICD-10-CM | POA: Diagnosis present

## 2018-12-26 DIAGNOSIS — K551 Chronic vascular disorders of intestine: Secondary | ICD-10-CM | POA: Diagnosis present

## 2018-12-26 DIAGNOSIS — N390 Urinary tract infection, site not specified: Secondary | ICD-10-CM | POA: Diagnosis not present

## 2018-12-26 DIAGNOSIS — Z7901 Long term (current) use of anticoagulants: Secondary | ICD-10-CM

## 2018-12-26 DIAGNOSIS — Z8711 Personal history of peptic ulcer disease: Secondary | ICD-10-CM

## 2018-12-26 LAB — GLUCOSE, CAPILLARY: Glucose-Capillary: 123 mg/dL — ABNORMAL HIGH (ref 70–99)

## 2018-12-26 MED ORDER — SODIUM CHLORIDE 0.9% FLUSH
3.0000 mL | Freq: Two times a day (BID) | INTRAVENOUS | Status: DC
  Administered 2018-12-27 – 2019-01-05 (×5): 3 mL via INTRAVENOUS

## 2018-12-26 MED ORDER — SODIUM CHLORIDE 0.9% FLUSH
3.0000 mL | Freq: Two times a day (BID) | INTRAVENOUS | Status: DC
  Administered 2018-12-27 – 2019-01-06 (×12): 3 mL via INTRAVENOUS

## 2018-12-26 MED ORDER — HYDRALAZINE HCL 50 MG PO TABS
50.0000 mg | ORAL_TABLET | Freq: Three times a day (TID) | ORAL | Status: DC
  Administered 2018-12-27: 50 mg via ORAL
  Filled 2018-12-26 (×2): qty 1

## 2018-12-26 MED ORDER — INSULIN ASPART 100 UNIT/ML ~~LOC~~ SOLN
0.0000 [IU] | Freq: Three times a day (TID) | SUBCUTANEOUS | Status: DC
  Administered 2018-12-28 – 2019-01-04 (×8): 1 [IU] via SUBCUTANEOUS
  Administered 2019-01-04: 2 [IU] via SUBCUTANEOUS
  Administered 2019-01-05 (×2): 1 [IU] via SUBCUTANEOUS
  Administered 2019-01-05: 2 [IU] via SUBCUTANEOUS
  Administered 2019-01-06 (×2): 1 [IU] via SUBCUTANEOUS

## 2018-12-26 MED ORDER — AMIODARONE HCL 200 MG PO TABS
200.0000 mg | ORAL_TABLET | ORAL | Status: DC
  Administered 2018-12-28 – 2019-01-06 (×5): 200 mg via ORAL
  Filled 2018-12-26 (×5): qty 1

## 2018-12-26 MED ORDER — ACETAMINOPHEN 650 MG RE SUPP
650.0000 mg | Freq: Four times a day (QID) | RECTAL | Status: DC | PRN
  Administered 2019-01-03: 650 mg via RECTAL
  Filled 2018-12-26: qty 1

## 2018-12-26 MED ORDER — SODIUM CHLORIDE 0.9% FLUSH: 3.0000 mL | INTRAVENOUS | Status: DC | PRN

## 2018-12-26 MED ORDER — INSULIN ASPART 100 UNIT/ML ~~LOC~~ SOLN: 0.0000 [IU] | Freq: Every day | SUBCUTANEOUS | Status: DC

## 2018-12-26 MED ORDER — ONDANSETRON HCL 4 MG PO TABS: 4.0000 mg | ORAL_TABLET | Freq: Four times a day (QID) | ORAL | Status: DC | PRN

## 2018-12-26 MED ORDER — PANTOPRAZOLE SODIUM 40 MG PO TBEC
40.0000 mg | DELAYED_RELEASE_TABLET | Freq: Two times a day (BID) | ORAL | Status: DC
  Administered 2018-12-27 – 2019-01-06 (×20): 40 mg via ORAL
  Filled 2018-12-26 (×21): qty 1

## 2018-12-26 MED ORDER — ONDANSETRON HCL 4 MG/2ML IJ SOLN
4.0000 mg | Freq: Four times a day (QID) | INTRAMUSCULAR | Status: DC | PRN
  Administered 2019-01-01 – 2019-01-06 (×2): 4 mg via INTRAVENOUS
  Filled 2018-12-26 (×2): qty 2

## 2018-12-26 MED ORDER — SODIUM CHLORIDE 0.9 % IV SOLN: 250.0000 mL | INTRAVENOUS | Status: DC | PRN

## 2018-12-26 MED ORDER — LABETALOL HCL 200 MG PO TABS
200.0000 mg | ORAL_TABLET | Freq: Two times a day (BID) | ORAL | Status: DC
  Administered 2018-12-27: 200 mg via ORAL
  Filled 2018-12-26: qty 1

## 2018-12-26 MED ORDER — AMLODIPINE BESYLATE 10 MG PO TABS
10.0000 mg | ORAL_TABLET | Freq: Every day | ORAL | Status: DC
  Administered 2018-12-27 – 2019-01-06 (×11): 10 mg via ORAL
  Filled 2018-12-26 (×11): qty 1

## 2018-12-26 MED ORDER — HYDRALAZINE HCL 25 MG PO TABS: 25.0000 mg | ORAL_TABLET | Freq: Three times a day (TID) | ORAL | Status: DC

## 2018-12-26 MED ORDER — ACETAMINOPHEN 325 MG PO TABS
650.0000 mg | ORAL_TABLET | Freq: Four times a day (QID) | ORAL | Status: DC | PRN
  Administered 2019-01-02 – 2019-01-06 (×2): 650 mg via ORAL
  Filled 2018-12-26 (×3): qty 2

## 2018-12-26 MED ORDER — CLONIDINE HCL 0.2 MG PO TABS
0.2000 mg | ORAL_TABLET | Freq: Two times a day (BID) | ORAL | Status: DC
  Administered 2018-12-27 – 2018-12-28 (×3): 0.2 mg via ORAL
  Filled 2018-12-26 (×3): qty 1

## 2018-12-26 NOTE — H&P (Signed)
History and Physical    Perry Molla UMP:[id number] DOB: 09-May-1946 DOA: 12/26/2018  PCP: Algis Greenhouse, MD   Patient coming from: Home   Chief Complaint: Acute renal failure superimposed on CKD   HPI: Jade Mclean is a 73 y.o. female with medical history significant for hypertension, paroxysmal atrial fibrillation on Eliquis, history of PE, history of peptic ulcer disease, presenting to the [facility] emergency department on 12/18/18 with epigastric discomfort, and N/V, found to have acute UTI and cholangitis, was admitted, completed treatment of the UTI and cholangitis, underwent laparoscopic cholecystectomy, and is now transferred to [facility] for management of acute kidney injury that has developed during the hospitalization.  [facility] course: Patient was admitted to the hospital on 12/18/18, completed treatment for UTI, was treated for cholangitis and underwent cholecystectomy on 12/22/2018, developed severe anemia that was discussed with her gastroenterologist and she had undergone EGD just prior to admission that was negative. Her fecal occult blood testing was also negative.  Her Eliquis was held and she was transfused with 2 units of packed red blood cells.   She has had worsening renal function, creatinine up to 3.7 from 2.5 on admission.  She had a renal ultrasound with atrophic right kidney.  She had reportedly lost significant weight unintentionally recently leading up to the admission, but has gained 11 pounds during the hospital admission. Due to her worsening renal function, hospitalist at [facility] discussed the case with nephrology at [facility] and transferred to the hospitalist service here at [facility] was arranged.  Her COVID-19 test was negative at [facility] and she denies fevers, cough, or SOB.   Review of Systems:  All other systems reviewed and apart from HPI, are negative.  Past Medical History:  Diagnosis Date  . Acute blood loss anemia   . Benign  hypertension 08/16/2015  . CHF (congestive heart failure) ([place])   . Chronic kidney disease, stage 3 ([place]) 02/20/2016  . Chronic rheumatic arthritis ([place]) 08/16/2015  . Fatigue 05/22/2018  . GI bleeding 07/05/2018  . Mixed hyperlipidemia 08/16/2015  . Obesity (BMI 30-39.9) 09/18/2017  . Osteoporosis 02/20/2016   2010: -2.1 2014: -1.8 2018: no fracture, no treatment 2019: -1.2  . Type 2 diabetes mellitus without complication, without long-term current use of insulin ([place]) 08/16/2015    Past Surgical History:  Procedure Laterality Date  . ESOPHAGOGASTRODUODENOSCOPY (EGD) WITH PROPOFOL N/A 07/05/2018   Procedure: ESOPHAGOGASTRODUODENOSCOPY (EGD) WITH PROPOFOL;  Surgeon: Juanita Craver, MD;  Location: [facility] ENDOSCOPY;  Service: Endoscopy;  Laterality: N/A;  . KNEE SURGERY Right      reports that she has never smoked. She has never used smokeless tobacco. She reports that she does not drink alcohol or use drugs.  Allergies  Allergen Reactions  . Colesevelam     Other reaction(s): GI Upset (intolerance)  . Dulaglutide     Other reaction(s): GI Upset (intolerance)  . Nsaids Other (See Comments)    2020: Duo ulcer with hemorrhage  . Rosuvastatin     Other reaction(s): Myalgias (intolerance)  . Simvastatin     Other reaction(s): Myalgias (intolerance)    Family History  Problem Relation Age of Onset  . Diabetes Mother   . Tuberculosis Father   . Diabetes Sister   . Heart attack Brother   . Diabetes Brother      Prior to Admission medications   Medication Sig Start Date End Date Taking? Authorizing Provider  amiodarone (PACERONE) 200 MG tablet Take 1 tablet (200 mg) daily on Monday,  Wednesday, and Friday only. 11/05/18   Richardo Priest, MD  amLODipine (NORVASC) 10 MG tablet Take 1 tablet (10 mg total) by mouth daily. 07/14/18   Thurnell Lose, MD  apixaban (ELIQUIS) 5 MG TABS tablet Take 1 tablet (5 mg total) by mouth 2 (two) times daily. 10/08/18   Richardo Priest, MD  Calcium  Carbonate-Vitamin D 600-400 MG-UNIT chew tablet Chew 1 tablet by mouth daily.    [provider]  cloNIDine (CATAPRES) 0.1 MG tablet Take 2 tablets (0.2 mg total) by mouth 2 (two) times daily. 11/11/18   Richardo Priest, MD  etanercept (ENBREL) 25 MG injection Inject 25 mg into the skin every Friday.    [provider]  famotidine (PEPCID) 40 MG tablet Take 1 tablet (40 mg total) by mouth daily. 10/22/18   Richardo Priest, MD  furosemide (LASIX) 40 MG tablet TAKE 1 TABLET(40 MG) BY MOUTH DAILY FLUID 11/06/18   Richardo Priest, MD  hydrALAZINE (APRESOLINE) 50 MG tablet TAKE 1 TABLET(50 MG) BY MOUTH THREE TIMES DAILY 12/01/18   Richardo Priest, MD  insulin degludec (TRESIBA) 100 UNIT/ML SOPN FlexTouch Pen Inject 12 Units into the skin daily.  01/12/18   [provider]  labetalol (NORMODYNE) 100 MG tablet Take 1 tablet (100 mg total) by mouth 2 (two) times daily. 10/22/18   Richardo Priest, MD  Multiple Vitamin (MULTIVITAMIN WITH MINERALS) TABS tablet Take 1 tablet by mouth daily.    [provider]  pantoprazole (PROTONIX) 40 MG tablet Take 1 tablet (40 mg total) by mouth daily. 10/22/18   Richardo Priest, MD    Physical Exam: There were no vitals filed for this visit.   Constitutional: NAD, calm  Eyes: PERTLA, lids and conjunctivae normal ENMT: Mucous membranes are moist. Posterior pharynx clear of any exudate or lesions.   Neck: normal, supple, no masses, no thyromegaly Respiratory:  no wheezing, no crackles. Normal respiratory effort. No accessory muscle use.  Cardiovascular: S1 & S2 heard, regular rate and rhythm. Trace edema to bilateral LE's. Abdomen: No distension, no tenderness, soft. Bowel sounds normal.  Musculoskeletal: no clubbing / cyanosis. No joint deformity upper and lower extremities.    Skin: no significant rashes, lesions, ulcers. Warm, dry, well-perfused. Neurologic: No gross facial asymmetry. Sensation intact. Moving all extremities.   Psychiatric: Alert and oriented to person, place, and situation. Calm, cooperative.    Labs on Admission: I have personally reviewed following labs and imaging studies  CBC: No results for input(s): WBC, NEUTROABS, HGB, HCT, MCV, PLT in the last 168 hours. Basic Metabolic Panel: No results for input(s): NA, K, CL, CO2, GLUCOSE, BUN, CREATININE, CALCIUM, MG, PHOS in the last 168 hours. GFR: CrCl cannot be calculated (Patient's most recent lab result is older than the maximum 21 days allowed.). Liver Function Tests: No results for input(s): AST, ALT, ALKPHOS, BILITOT, PROT, ALBUMIN in the last 168 hours. No results for input(s): LIPASE, AMYLASE in the last 168 hours. No results for input(s): AMMONIA in the last 168 hours. Coagulation Profile: No results for input(s): INR, PROTIME in the last 168 hours. Cardiac Enzymes: No results for input(s): CKTOTAL, CKMB, CKMBINDEX, TROPONINI in the last 168 hours. BNP (last 3 results) Recent Labs    10/23/18 1020  PROBNP 1,732*   HbA1C: No results for input(s): HGBA1C in the last 72 hours. CBG: No results for input(s): GLUCAP in the last 168 hours. Lipid Profile: No results for input(s): CHOL, HDL, LDLCALC, TRIG, CHOLHDL,  LDLDIRECT in the last 72 hours. Thyroid Function Tests: No results for input(s): TSH, T4TOTAL, FREET4, T3FREE, THYROIDAB in the last 72 hours. Anemia Panel: No results for input(s): VITAMINB12, FOLATE, FERRITIN, TIBC, IRON, RETICCTPCT in the last 72 hours. Urine analysis:    Component Value Date/Time   COLORURINE YELLOW 07/12/2018 0444   APPEARANCEUR HAZY (A) 07/12/2018 0444   LABSPEC 1.021 07/12/2018 0444   PHURINE 5.0 07/12/2018 0444   GLUCOSEU NEGATIVE 07/12/2018 0444   HGBUR MODERATE (A) 07/12/2018 0444   BILIRUBINUR SMALL (A) 07/12/2018 0444   KETONESUR 5 (A) 07/12/2018 0444   PROTEINUR 100 (A) 07/12/2018 0444   NITRITE NEGATIVE 07/12/2018 0444   LEUKOCYTESUR MODERATE (A) 07/12/2018 0444   Sepsis Labs:  @LABRCNTIP (procalcitonin:4,lacticidven:4) )No results found for this or any previous visit (from the past 240 hour(s)).   Radiological Exams on Admission: No results found.  EKG: Ordered.   Assessment/Plan   1. 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 Anemia; history of PUD  - Admission Hgb was 11.2, dropped to 6.5 this am and she was transfused with 2 units RBC's  - She had EGD just prior to admission that was negative, FOBT was negative - Possibly from the surgery on 6/16 and there is also likely a dilutional component given 11 lb wt-gain during admission  - Type and screen, repeat CBC now, resume anticoagulation if H&H remain stable   3. Paroxysmal atrial fibrillation  - Rate-controlled on admission  - CHADS-VASc is 60 (age, gender, HTN, DM)  - Eliquis has been held for severe anemia with plan to resume if H&H remain stable given negative FOBT and recent EGD with resolution in her prior PUD   4. Hypertension  - She was continued on Norvasc, hydralazine, labetalol, and clonidine at [facility], remained hypertensive but this improved after labetalol was increased  - Continue Norvasc, clonidine, hydralazine, labetalol    5. Cholangitis; s/p cholecystectomy on 12/22/18  - Resolved  - Treated at [facility] and underwent laparoscopic cholecystectomy  - Surgical wounds appear to be healing appropriately, abd soft, and patient denies pain on N/V but appetite remains poor    6. UTI  - Resolved, completed treatment at [facility]    7. History of PE  - No chest pain or hypoxia on admission  - Eliquis is held for severe anemia requiring transfusion, plan to resume if H&H remain  stable    PPE: Mask, face shield. Patient wearing mask.  DVT prophylaxis: SCD's. Eliquis pta, held for transfusion-dependent anemia Code Status: Full  Family Communication: Discussed with patient  Consults called: None  Admission status: Inpatient    Vianne Bulls, MD [facility] Pager [phone]  If 7PM-7AM, please contact night-coverage [email or web address] Password [email or web address]  12/26/2018, 11:05 PM

## 2018-12-27 ENCOUNTER — Encounter (HOSPITAL_COMMUNITY): Payer: Self-pay | Admitting: *Deleted

## 2018-12-27 ENCOUNTER — Other Ambulatory Visit: Payer: Self-pay

## 2018-12-27 LAB — CBC WITH DIFFERENTIAL/PLATELET
Abs Immature Granulocytes: 0.05 10*3/uL (ref 0.00–0.07)
Basophils Absolute: 0 10*3/uL (ref 0.0–0.1)
Basophils Relative: 1 %
Eosinophils Absolute: 0.2 10*3/uL (ref 0.0–0.5)
Eosinophils Relative: 5 %
HCT: 22.9 % — ABNORMAL LOW (ref 36.0–46.0)
Hemoglobin: 7.7 g/dL — ABNORMAL LOW (ref 12.0–15.0)
Immature Granulocytes: 1 %
Lymphocytes Relative: 8 %
Lymphs Abs: 0.4 10*3/uL — ABNORMAL LOW (ref 0.7–4.0)
MCH: 29.3 pg (ref 26.0–34.0)
MCHC: 33.6 g/dL (ref 30.0–36.0)
MCV: 87.1 fL (ref 80.0–100.0)
Monocytes Absolute: 0.5 10*3/uL (ref 0.1–1.0)
Monocytes Relative: 10 %
Neutro Abs: 3.9 10*3/uL (ref 1.7–7.7)
Neutrophils Relative %: 75 %
Platelets: 167 10*3/uL (ref 150–400)
RBC: 2.63 MIL/uL — ABNORMAL LOW (ref 3.87–5.11)
RDW: 16.7 % — ABNORMAL HIGH (ref 11.5–15.5)
WBC: 5.2 10*3/uL (ref 4.0–10.5)
nRBC: 0.4 % — ABNORMAL HIGH (ref 0.0–0.2)

## 2018-12-27 LAB — URINALYSIS, COMPLETE (UACMP) WITH MICROSCOPIC
Bilirubin Urine: NEGATIVE
Glucose, UA: NEGATIVE mg/dL
Hgb urine dipstick: NEGATIVE
Ketones, ur: NEGATIVE mg/dL
Leukocytes,Ua: NEGATIVE
Nitrite: NEGATIVE
Protein, ur: 100 mg/dL — AB
Specific Gravity, Urine: 1.009 (ref 1.005–1.030)
pH: 5 (ref 5.0–8.0)

## 2018-12-27 LAB — MAGNESIUM: Magnesium: 1.6 mg/dL — ABNORMAL LOW (ref 1.7–2.4)

## 2018-12-27 LAB — COMPREHENSIVE METABOLIC PANEL
ALT: 25 U/L (ref 0–44)
AST: 31 U/L (ref 15–41)
Albumin: 2.3 g/dL — ABNORMAL LOW (ref 3.5–5.0)
Alkaline Phosphatase: 48 U/L (ref 38–126)
Anion gap: 10 (ref 5–15)
BUN: 41 mg/dL — ABNORMAL HIGH (ref 8–23)
CO2: 17 mmol/L — ABNORMAL LOW (ref 22–32)
Calcium: 8.2 mg/dL — ABNORMAL LOW (ref 8.9–10.3)
Chloride: 110 mmol/L (ref 98–111)
Creatinine, Ser: 4.09 mg/dL — ABNORMAL HIGH (ref 0.44–1.00)
GFR calc Af Amer: 12 mL/min — ABNORMAL LOW (ref >60)
GFR calc non Af Amer: 10 mL/min — ABNORMAL LOW (ref >60)
Glucose, Bld: 139 mg/dL — ABNORMAL HIGH (ref 70–99)
Potassium: 3.9 mmol/L (ref 3.5–5.1)
Sodium: 137 mmol/L (ref 135–145)
Total Bilirubin: 0.8 mg/dL (ref 0.3–1.2)
Total Protein: 5.4 g/dL — ABNORMAL LOW (ref 6.5–8.1)

## 2018-12-27 LAB — GLUCOSE, CAPILLARY
Glucose-Capillary: 120 mg/dL — ABNORMAL HIGH (ref 70–99)
Glucose-Capillary: 118 mg/dL — ABNORMAL HIGH (ref 70–99)
Glucose-Capillary: 115 mg/dL — ABNORMAL HIGH (ref 70–99)
Glucose-Capillary: 102 mg/dL — ABNORMAL HIGH (ref 70–99)

## 2018-12-27 LAB — SODIUM, URINE, RANDOM: Sodium, Ur: 72 mmol/L

## 2018-12-27 LAB — CREATININE, URINE, RANDOM: Creatinine, Urine: 68.68 mg/dL

## 2018-12-27 LAB — TYPE AND SCREEN
ABO/RH(D): O NEG
Antibody Screen: NEGATIVE

## 2018-12-27 MED ORDER — HYDRALAZINE HCL 50 MG PO TABS: 100.0000 mg | ORAL_TABLET | Freq: Three times a day (TID) | ORAL | Status: DC

## 2018-12-27 MED ORDER — HYDRALAZINE HCL 50 MG PO TABS
100.0000 mg | ORAL_TABLET | Freq: Three times a day (TID) | ORAL | Status: DC
  Administered 2018-12-27 – 2019-01-06 (×29): 100 mg via ORAL
  Filled 2018-12-27 (×31): qty 2

## 2018-12-27 MED ORDER — CARVEDILOL 6.25 MG PO TABS: 6.2500 mg | ORAL_TABLET | Freq: Two times a day (BID) | ORAL | Status: DC

## 2018-12-27 NOTE — Progress Notes (Signed)
Received via stretcher from [facility]. No family with pt. Only belongings brought with patient are: tennis shoes, clothes, hand sanitizer and bottled water. Patient stated she had glasses and pocketbook but none found. Spoke with [facility], none brought with them. POA is niece, Deliah Goody" [phone]. She would like no information given out regarding patient and would like a password :Sallie to be used. Patient also has a Film/video editor in [place], Dr. Bettina Gavia, a GI doctor in [place], [place], and a Nephrologist in [place], Dr. Neta Ehlers. Patient lives with brother, Jenny Reichmann [phone]. Assessment as charted.

## 2018-12-27 NOTE — Progress Notes (Signed)
En route to another patient, chaplain responded to patient calling for help with phone. Pt states that phone is not working. However, pt had trouble remembering number and dialing herself.   Pt is agitated, anxious, afraid that her niece is trying to [employer] ("$[phone],[id number]").  At times her speech is rambling and difficult to understand.  Assisted pt in finding contact numbers and calling sister. Pt spoke to sister, but pt says, "The problem is, no one believes me." Pt appears to have racing thoughts and worries, possibly veering into paranoid thinking.  She states that she wants to be moved to an individual room where the TV is not blaring. When chaplain explains that this an individual room, and the TV is off, pt states that she will pay any amount to get a different room.    Chaplain offered prayer (pt is [place]) and encouraged pt to rest.  She has fallen asleep at this time. Chaplain will refer pt for follow-up spiritual care.  Pt wants assistance to call bank, etc.    Note: pt mentioned that her current RN is a very good person, taking good care of her and she appreciates him a lot.        12/27/18 1800  Clinical Encounter Type  Visited With Patient  Visit Type Initial;Psychological support  Referral From Patient  Consult/Referral To Other (Comment) (pt calling for help with phone)  Stress Factors  Patient Stress Factors Family relationships;Financial concerns

## 2018-12-27 NOTE — Progress Notes (Signed)
PER NIECE,  "MONIQUE", she is the POA for pt

## 2018-12-27 NOTE — Consult Note (Signed)
[facility]    NEPHROLOGY CONSULTATION NOTE  PATIENT ID:  Jade Mclean, DOB:  1946/05/01  HPI: The patient is a 73 y.o. year old female with a past medical history significant for hypertension, paroxysmal atrial fibrillation on Eliquis, PE, and peptic ulcer disease presented to the [facility] emergency department on 12/18/2018 with epigastric discomfort and nausea and vomiting.  She was found to have an acute urinary tract infection and cholangitis.  She was admitted and was treated with antibiotics and underwent a laparoscopic cholecystectomy.  She developed severe anemia during that hospitalization requiring 2 units of packed red blood cells.  She had just undergone an EGD prior to that admission that was negative, and her fecal occult blood test were also negative.  Her serum creatinine had worsened to 3.7 from 2.5 on admission (1.82 in May 2020).  A renal ultrasound revealed an atrophic right kidney.  She has gained 11 pounds since her hospitalization.  The hospitalist at [facility] requested further intervention and management by nephrology.  She was subsequently transferred to [facility] for further evaluation.   Past Medical History:  Diagnosis Date  . Acute blood loss anemia   . Benign hypertension 08/16/2015  . CHF (congestive heart failure) ([place])   . Chronic kidney disease, stage 3 ([place]) 02/20/2016  . Chronic rheumatic arthritis ([place]) 08/16/2015  . Fatigue 05/22/2018  . GI bleeding 07/05/2018  . Mixed hyperlipidemia 08/16/2015  . Obesity (BMI 30-39.9) 09/18/2017  . Osteoporosis 02/20/2016   2010: -2.1 2014: -1.8 2018: no fracture, no treatment 2019: -1.2  . Type 2 diabetes mellitus without complication, without long-term current use of insulin ([place]) 08/16/2015    Past Surgical History:  Procedure Laterality Date  . ESOPHAGOGASTRODUODENOSCOPY (EGD) WITH PROPOFOL N/A 07/05/2018   Procedure: ESOPHAGOGASTRODUODENOSCOPY (EGD) WITH PROPOFOL;  Surgeon: Juanita Craver, MD;  Location: [facility]  ENDOSCOPY;  Service: Endoscopy;  Laterality: N/A;  . KNEE SURGERY Right     Family History  Problem Relation Age of Onset  . Diabetes Mother   . Tuberculosis Father   . Diabetes Sister   . Heart attack Brother   . Diabetes Brother     Social History   Tobacco Use  . Smoking status: Never Smoker  . Smokeless tobacco: Never Used  Substance Use Topics  . Alcohol use: Never    Frequency: Never  . Drug use: Never    REVIEW OF SYSTEMS: General: Positive fatigue, no weakness Head:  no headaches Eyes:  no blurred vision ENT:  no sore throat Neck:  no masses CV:  no chest pain, no orthopnea, no lower extremity edema  lungs:  no shortness of breath, no cough GI:  no nausea or vomiting, no diarrhea GU:  no dysuria or hematuria Skin:  no rashes or lesions Neuro:  no focal numbness or weakness Psych:  no depression or anxiety    PHYSICAL EXAM:  Vitals:   12/26/18 2347 12/27/18 1022  BP: (!) 172/44 (!) 174/56  Pulse: (!) 51 (!) 52  Resp: 19 18  Temp: 98.2 F (36.8 C) 98.1 F (36.7 C)  SpO2: 91% 94%   I/O last 3 completed shifts: In: 72 [P.O.:120] Out: 100 [Urine:100]   General:  AAOx3 NAD HEENT: MMM [place] AT anicteric sclera Neck:  No JVD, no adenopathy CV:  Heart RRR  Lungs:  L/S CTA bilaterally Abd:  abd SNT/ND with normal BS, obese GU:  Bladder non-palpable Extremities: Trace bilateral lower extremity edema Skin:  No skin rash Psych:  normal mood and  affect Neuro:  no focal deficits   CURRENT MEDICATIONS:  . [START ON 12/28/2018] amiodarone  200 mg Oral Q M,W,F  . amLODipine  10 mg Oral Daily  . cloNIDine  0.2 mg Oral BID  . hydrALAZINE  50 mg Oral Q8H  . insulin aspart  0-5 Units Subcutaneous QHS  . insulin aspart  0-9 Units Subcutaneous TID WC  . labetalol  200 mg Oral BID  . pantoprazole  40 mg Oral BID  . sodium chloride flush  3 mL Intravenous Q12H  . sodium chloride flush  3 mL Intravenous Q12H     HOME MEDICATIONS:  Prior to Admission  medications   Medication Sig Start Date End Date Taking? Authorizing Provider  amiodarone (PACERONE) 200 MG tablet Take 1 tablet (200 mg) daily on Monday, Wednesday, and Friday only. 11/05/18  Yes Richardo Priest, MD  amLODipine (NORVASC) 10 MG tablet Take 1 tablet (10 mg total) by mouth daily. 07/14/18  Yes Thurnell Lose, MD  apixaban (ELIQUIS) 5 MG TABS tablet Take 1 tablet (5 mg total) by mouth 2 (two) times daily. 10/08/18  Yes Richardo Priest, MD  Calcium Carbonate-Vitamin D 600-400 MG-UNIT chew tablet Chew 1 tablet by mouth daily.   Yes [provider]  cloNIDine (CATAPRES) 0.1 MG tablet Take 2 tablets (0.2 mg total) by mouth 2 (two) times daily. 11/11/18  Yes Richardo Priest, MD  etanercept (ENBREL) 25 MG injection Inject 25 mg into the skin every Friday.   Yes [provider]  famotidine (PEPCID) 40 MG tablet Take 1 tablet (40 mg total) by mouth daily. 10/22/18  Yes Richardo Priest, MD  furosemide (LASIX) 40 MG tablet TAKE 1 TABLET(40 MG) BY MOUTH DAILY FLUID Patient taking differently: Take 20 mg by mouth daily.  11/06/18  Yes Richardo Priest, MD  hydrALAZINE (APRESOLINE) 50 MG tablet TAKE 1 TABLET(50 MG) BY MOUTH THREE TIMES DAILY Patient taking differently: Take 50 mg by mouth 3 (three) times daily.  12/01/18  Yes Richardo Priest, MD  labetalol (NORMODYNE) 100 MG tablet Take 1 tablet (100 mg total) by mouth 2 (two) times daily. 10/22/18  Yes Richardo Priest, MD  Multiple Vitamin (MULTIVITAMIN WITH MINERALS) TABS tablet Take 1 tablet by mouth daily.   Yes [provider]  pantoprazole (PROTONIX) 40 MG tablet Take 1 tablet (40 mg total) by mouth daily. Patient taking differently: Take 40 mg by mouth 2 (two) times daily.  10/22/18  Yes Richardo Priest, MD  insulin degludec (TRESIBA) 100 UNIT/ML SOPN FlexTouch Pen Inject 12 Units into the skin daily.  01/12/18   [provider]       LABS:  CBC Latest Ref Rng & Units 12/27/2018 10/23/2018 07/14/2018  WBC 4.0 -  10.5 K/uL 5.2 4.4 2.4(L)  Hemoglobin 12.0 - 15.0 g/dL 7.7(L) 10.9(L) 9.8(L)  Hematocrit 36.0 - 46.0 % 22.9(L) 34.0 31.8(L)  Platelets 150 - 400 K/uL 167 167 172    CMP Latest Ref Rng & Units 12/27/2018 11/24/2018 11/16/2018  Glucose 70 - 99 mg/dL 139(H) 154(H) 94  BUN 8 - 23 mg/dL 41(H) 18 24  Creatinine 0.44 - 1.00 mg/dL 4.09(H) 1.86(H) 2.14(H)  Sodium 135 - 145 mmol/L 137 142 139  Potassium 3.5 - 5.1 mmol/L 3.9 3.4(L) 3.3(L)  Chloride 98 - 111 mmol/L 110 105 102  CO2 22 - 32 mmol/L 17(L) 20 21  Calcium 8.9 - 10.3 mg/dL 8.2(L) 8.8 9.1  Total Protein 6.5 - 8.1 g/dL 5.4(L) - -  Total Bilirubin 0.3 - 1.2 mg/dL 0.8 - -  Alkaline Phos 38 - 126 U/L 48 - -  AST 15 - 41 U/L 31 - -  ALT 0 - 44 U/L 25 - -    Lab Results  Component Value Date   PTH 19 07/14/2018   CALCIUM 8.2 (L) 12/27/2018   PHOS 2.3 (L) 07/14/2018       Component Value Date/Time   COLORURINE YELLOW 12/27/2018 0118   APPEARANCEUR HAZY (A) 12/27/2018 0118   LABSPEC 1.009 12/27/2018 0118   PHURINE 5.0 12/27/2018 0118   GLUCOSEU NEGATIVE 12/27/2018 0118   HGBUR NEGATIVE 12/27/2018 0118   BILIRUBINUR NEGATIVE 12/27/2018 0118   KETONESUR NEGATIVE 12/27/2018 0118   PROTEINUR 100 (A) 12/27/2018 0118   NITRITE NEGATIVE 12/27/2018 0118   LEUKOCYTESUR NEGATIVE 12/27/2018 0118      Component Value Date/Time   PHART 7.412 07/06/2018 0850   PCO2ART 34.9 07/06/2018 0850   PO2ART 206 (H) 07/06/2018 0850   HCO3 21.9 07/06/2018 0850   ACIDBASEDEF 2.1 (H) 07/06/2018 0850   O2SAT 99.9 07/06/2018 0850       Component Value Date/Time   IRON 29 07/07/2018 1825   TIBC 179 (L) 07/07/2018 1825   FERRITIN 495 (H) 07/07/2018 1825   IRONPCTSAT 16 07/07/2018 1825       ASSESSMENT/PLAN:    The patient is a 73 y.o. year old female with a past medical history significant for hypertension, paroxysmal atrial fibrillation on Eliquis, PE, and peptic ulcer disease presented to the [facility] emergency department on 12/18/2018 with epigastric  discomfort and nausea and vomiting.  She was found to have an acute urinary tract infection and cholangitis.  She was admitted and was treated with antibiotics and underwent a laparoscopic cholecystectomy.  She developed severe anemia during that hospitalization requiring 2 units of packed red blood cells.  She had just undergone an EGD prior to that admission that was negative, and her fecal occult blood test were also negative.  Her serum creatinine had worsened to 3.7 from 2.5 on admission (1.82 in May 2020).  A renal ultrasound revealed an atrophic right kidney.  She has gained 11 pounds since her hospitalization.  The hospitalist at [facility] requested further intervention and management by nephrology.  She was subsequently transferred to [facility] for further evaluation.  1.  Chronic kidney disease stage III.  Her baseline serum creatinine was 1.82 in May 2020.  I suspect her chronic kidney disease on the basis of longstanding hypertension.  Will check a urinalysis.  She does have a documented right atrophic kidney, suggesting some renal vascular disease.  2.  Acute kidney injury.  I suspect this is hemodynamic in nature.  She had significant anemia requiring packed RBCs.  She also underwent a cholecystectomy.  While her weight has increased during her hospitalization, she is oxygenating well on room air and does not examine markedly fluid overloaded.  I see no urgent indication for dialysis.  Will monitor urine output closely.  Would hold off on aggressive diuresis.  3.  Cholangitis status post cholecystectomy on 12/22/2018.  4.  Hypertension.  Will change labetalol to carvedilol and uptitrate.  Would avoid ACE inhibition in the setting of acute kidney injury.  5.  Anemia.  Likely secondary to acute blood loss.  Did received packed red blood cell transfusion at [facility].  Eliquis is currently on hold.  [place], DO, [place]

## 2018-12-27 NOTE — Progress Notes (Signed)
Spoke with POA with updates on patient.

## 2018-12-27 NOTE — ED Notes (Signed)
Pt states that she wants to change POA and no longer wants her niece Beckie Busing to have this responsibility.  See previous Spiritual Care note.  Chaplain did not attempt education about AD or that an HCPOA is not related to financial matters because pt was agitated and exhausted.  Minus Liberty, [place]

## 2018-12-27 NOTE — Progress Notes (Signed)
PROGRESS NOTE    Jade Mclean  VQM:[id number] DOB: 04-05-1946 DOA: 12/26/2018 PCP: Algis Greenhouse, MD      Brief Narrative:  Mrs. Toves is a 73 y.o. F with hx HTN, DM, CKD baseline 1.8-2.2, pAF on Eliquis, RA previously on Enbrel, hx PE who presented on 6/12 (8 days PTA here) with epigastric discomfort, N/V and found to have cholangitis.  Admitted and treated for cholangitis and "UTI", underwent lap chole and then developed AKI, so transferred to [facility].  Cr 2.4 on admission there, trended up to 3.7.  Renal US showed atrophic right kidney.  Also anemia requiring transfusion 2 units at OSH.       Assessment & Plan: AKI on CKD IV Non-anion gap metabolic acidosis Cr baseline 1.8-2.2.  2.5 on admission: 2.5 --> 2.7 --> 3.4 (6/14) --> 3.5 --> 3.7 (day of surgery) --> 3.7 --> 4.0 --> 3.9 --> 3.7 (on 6/20 in AM) --> 4.0 on arrival here overnight 6/20  Renal US shows atrophic right kidney per report. Acidosis due to renal funciton. Given lasix 20 mg IV on 6/20  -Hold Lasix -Follow up FeUrea -Hold nephrotoxins -Consult Nephrology -Daily BMP -Strict I/Os   Paroxysmal atrial fibrillation Stable -Continue amiodarone -Hold Eliquis until anemia clearer  Delirium Post-operative delirium persists.  Anemia Unclear cause.   Has hx PUD but reportedly had a normal EGD shortly prior to admission (report not available) and negative FOBT at OSH x1.   11.2 g/dL on admission to OSH, trended down to 8.1 prior to surgery, then trended further to 6.5 post-surgery.   Received 2 units PRBCs prior to transfer.  Hgb 7.7 on arrival here, less than expected increase in Hgb.  -Transfusion threshold 7 g/dL -Repeat FOBT -Check iron studies, B12 and folate, EPO level  Recent cholangitis Completed treatment at [facility]  S/p cholecystectomy on 12/22/18  Hypertension Poorly Controlled overnight -Continue clonidine, amlodipine, hydralazine, labetalol  Diabetes Glucoses well controlled -Continue  SSI correction insulin  RA Previously on Enbrel, she is currently too confused to remember how recently she was on this.  Stage II pressure injury, L buttocks, POA  Other medications -Continue pantoprazole  Hypomagnesemia -Supplement mag        MDM and disposition: The below labs and imaging reports were reviewed and summarized above.  Medication management as above.  The patient was admitted with acute renal failure.         DVT prophylaxis: SCDs Code Status: FULL Family Communication: Patient requested not to call her family    Consultants:   Nephrology  Procedures:   US Renal at OSH  Antimicrobials:   None here    Subjective: Patient with nonfocal diffuse abdominal pain.  No vomtiing.  No diarrhea.  Still confused.  No fever.  Making urine.    Objective: Vitals:   12/26/18 2347 12/27/18 0500  BP: (!) 172/44   Pulse: (!) 51   Resp: 19   Temp: 98.2 F (36.8 C)   TempSrc: Oral   SpO2: 91%   Weight: 86.2 kg 86.2 kg  Height: 5\' 6"  (1.676 m)     Intake/Output Summary (Last 24 hours) at 12/27/2018 0857 Last data filed at 12/27/2018 0600 Gross per 24 hour  Intake 120 ml  Output 100 ml  Net 20 ml   Filed Weights   12/26/18 2347 12/27/18 0500  Weight: 86.2 kg 86.2 kg    Examination: General appearance: older adult female, alert and in no acute distress.   HEENT: Anicteric, conjunctiva pink, lids  and lashes normal. No nasal deformity, discharge, epistaxis.  Lips moist.   Skin: Warm and dry. No suspicious rashes or lesions. Cardiac: RRR, nl S1-S2, no murmurs appreciated.  Capillary refill is brisk.  JVP not visible.  No LE edema.  Radial pulses 2+ and symmetric. Respiratory: Shallow due to pain.  No rales or wheezes. Abdomen: Abdomen soft.  Mild nonfocal TTP with guarding. Surgical wounds appears clean. No ascites, distension, hepatosplenomegaly.   MSK: No deformities or effusions. Neuro: Awake and alert but confused.  EOMI, moves all  extremities with symmetric weakness. Speech fluent.    Psych: Sensorium intact and responding to questions, attention diminished. Affect normal.  Judgment and insight appear slightly impaired.    Data Reviewed: I have personally reviewed following labs and imaging studies:  CBC: Recent Labs  Lab 12/27/18 0038  WBC 5.2  NEUTROABS 3.9  HGB 7.7*  HCT 22.9*  MCV 87.1  PLT 408   Basic Metabolic Panel: Recent Labs  Lab 12/27/18 0038  NA 137  K 3.9  CL 110  CO2 17*  GLUCOSE 139*  BUN 41*  CREATININE 4.09*  CALCIUM 8.2*  MG 1.6*   GFR: Estimated Creatinine Clearance: 14 mL/min (A) (by C-G formula based on SCr of 4.09 mg/dL (H)). Liver Function Tests: Recent Labs  Lab 12/27/18 0038  AST 31  ALT 25  ALKPHOS 48  BILITOT 0.8  PROT 5.4*  ALBUMIN 2.3*   No results for input(s): LIPASE, AMYLASE in the last 168 hours. No results for input(s): AMMONIA in the last 168 hours. Coagulation Profile: No results for input(s): INR, PROTIME in the last 168 hours. Cardiac Enzymes: No results for input(s): CKTOTAL, CKMB, CKMBINDEX, TROPONINI in the last 168 hours. BNP (last 3 results) Recent Labs    10/23/18 1020  PROBNP 1,732*   HbA1C: No results for input(s): HGBA1C in the last 72 hours. CBG: Recent Labs  Lab 12/26/18 2354 12/27/18 0713  GLUCAP 123* 120*   Lipid Profile: No results for input(s): CHOL, HDL, LDLCALC, TRIG, CHOLHDL, LDLDIRECT in the last 72 hours. Thyroid Function Tests: No results for input(s): TSH, T4TOTAL, FREET4, T3FREE, THYROIDAB in the last 72 hours. Anemia Panel: No results for input(s): VITAMINB12, FOLATE, FERRITIN, TIBC, IRON, RETICCTPCT in the last 72 hours. Urine analysis:    Component Value Date/Time   COLORURINE YELLOW 12/27/2018 0118   APPEARANCEUR HAZY (A) 12/27/2018 0118   LABSPEC 1.009 12/27/2018 0118   PHURINE 5.0 12/27/2018 0118   GLUCOSEU NEGATIVE 12/27/2018 0118   HGBUR NEGATIVE 12/27/2018 0118   BILIRUBINUR NEGATIVE 12/27/2018  0118   KETONESUR NEGATIVE 12/27/2018 0118   PROTEINUR 100 (A) 12/27/2018 0118   NITRITE NEGATIVE 12/27/2018 0118   LEUKOCYTESUR NEGATIVE 12/27/2018 0118   Sepsis Labs: @LABRCNTIP (procalcitonin:4,lacticacidven:4)  )No results found for this or any previous visit (from the past 240 hour(s)).       Radiology Studies: No results found.      Scheduled Meds: . [START ON 12/28/2018] amiodarone  200 mg Oral Q M,W,F  . amLODipine  10 mg Oral Daily  . cloNIDine  0.2 mg Oral BID  . hydrALAZINE  50 mg Oral Q8H  . insulin aspart  0-5 Units Subcutaneous QHS  . insulin aspart  0-9 Units Subcutaneous TID WC  . labetalol  200 mg Oral BID  . pantoprazole  40 mg Oral BID  . sodium chloride flush  3 mL Intravenous Q12H  . sodium chloride flush  3 mL Intravenous Q12H   Continuous Infusions: . sodium chloride  LOS: 1 day    Time spent: 25 minutes    Edwin Dada, MD [facility] 12/27/2018, 8:57 AM     Please page through [employer]:  [email or web address] Password [email or web address] If 7PM-7AM, please contact night-coverage

## 2018-12-28 LAB — CBC
HCT: 24.4 % — ABNORMAL LOW (ref 36.0–46.0)
Hemoglobin: 8.1 g/dL — ABNORMAL LOW (ref 12.0–15.0)
MCH: 29.1 pg (ref 26.0–34.0)
MCHC: 33.2 g/dL (ref 30.0–36.0)
MCV: 87.8 fL (ref 80.0–100.0)
Platelets: 187 10*3/uL (ref 150–400)
RBC: 2.78 MIL/uL — ABNORMAL LOW (ref 3.87–5.11)
RDW: 16.6 % — ABNORMAL HIGH (ref 11.5–15.5)
WBC: 5.8 10*3/uL (ref 4.0–10.5)
nRBC: 0.3 % — ABNORMAL HIGH (ref 0.0–0.2)

## 2018-12-28 LAB — IRON AND TIBC
Iron: 37 ug/dL (ref 28–170)
Saturation Ratios: 23 % (ref 10.4–31.8)
TIBC: 161 ug/dL — ABNORMAL LOW (ref 250–450)
UIBC: 124 ug/dL

## 2018-12-28 LAB — BASIC METABOLIC PANEL
Anion gap: 13 (ref 5–15)
BUN: 44 mg/dL — ABNORMAL HIGH (ref 8–23)
CO2: 16 mmol/L — ABNORMAL LOW (ref 22–32)
Calcium: 8.4 mg/dL — ABNORMAL LOW (ref 8.9–10.3)
Chloride: 109 mmol/L (ref 98–111)
Creatinine, Ser: 3.92 mg/dL — ABNORMAL HIGH (ref 0.44–1.00)
GFR calc Af Amer: 13 mL/min — ABNORMAL LOW (ref >60)
GFR calc non Af Amer: 11 mL/min — ABNORMAL LOW (ref >60)
Glucose, Bld: 134 mg/dL — ABNORMAL HIGH (ref 70–99)
Potassium: 4 mmol/L (ref 3.5–5.1)
Sodium: 138 mmol/L (ref 135–145)

## 2018-12-28 LAB — GLUCOSE, CAPILLARY
Glucose-Capillary: 120 mg/dL — ABNORMAL HIGH (ref 70–99)
Glucose-Capillary: 128 mg/dL — ABNORMAL HIGH (ref 70–99)
Glucose-Capillary: 115 mg/dL — ABNORMAL HIGH (ref 70–99)
Glucose-Capillary: 137 mg/dL — ABNORMAL HIGH (ref 70–99)

## 2018-12-28 LAB — FERRITIN: Ferritin: 566 ng/mL — ABNORMAL HIGH (ref 11–307)

## 2018-12-28 LAB — VITAMIN B12: Vitamin B-12: 821 pg/mL (ref 180–914)

## 2018-12-28 LAB — UREA NITROGEN, URINE: Urea Nitrogen, Ur: 223 mg/dL

## 2018-12-28 MED ORDER — ENSURE ENLIVE PO LIQD: 237.0000 mL | Freq: Two times a day (BID) | ORAL | Status: DC

## 2018-12-28 MED ORDER — LABETALOL HCL 200 MG PO TABS
200.0000 mg | ORAL_TABLET | Freq: Two times a day (BID) | ORAL | Status: DC
  Administered 2018-12-28 (×2): 200 mg via ORAL
  Filled 2018-12-28 (×2): qty 1

## 2018-12-28 MED ORDER — ADULT MULTIVITAMIN W/MINERALS CH
1.0000 | ORAL_TABLET | Freq: Every day | ORAL | Status: DC
  Administered 2018-12-29 – 2019-01-06 (×9): 1 via ORAL
  Filled 2018-12-28 (×9): qty 1

## 2018-12-28 MED ORDER — HALOPERIDOL 1 MG PO TABS
2.0000 mg | ORAL_TABLET | Freq: Every evening | ORAL | Status: DC | PRN
  Filled 2018-12-28: qty 1

## 2018-12-28 MED ORDER — CLONIDINE HCL 0.1 MG PO TABS
0.1000 mg | ORAL_TABLET | Freq: Two times a day (BID) | ORAL | Status: DC
  Administered 2018-12-28: 0.1 mg via ORAL
  Filled 2018-12-28: qty 1

## 2018-12-28 MED ORDER — FUROSEMIDE 10 MG/ML IJ SOLN
40.0000 mg | Freq: Two times a day (BID) | INTRAMUSCULAR | Status: DC
  Administered 2018-12-29 (×2): 40 mg via INTRAVENOUS
  Filled 2018-12-28 (×2): qty 4

## 2018-12-28 NOTE — Progress Notes (Signed)
[facility]    NEPHROLOGY PROGRESS NOTE  SUBJECTIVE: Patient seen and examined today.  Patient denies any acute complaints.  Denies chest pain, shortness of breath, nausea, vomiting, diarrhea or dysuria.  All other review of systems are negative.  Excellent urine output noted.   OBJECTIVE:  Vitals:   12/28/18 0351 12/28/18 0900  BP: (!) 182/49 (!) 155/65  Pulse: (!) 48 60  Resp: 18 16  Temp: 98.4 F (36.9 C) 98 F (36.7 C)  SpO2: 97% 100%    Intake/Output Summary (Last 24 hours) at 12/28/2018 1632 Last data filed at 12/28/2018 1340 Gross per 24 hour  Intake 500 ml  Output 2225 ml  Net -1725 ml      General:  AAOx3 NAD HEENT: MMM [place] AT anicteric sclera Neck: Positive JVD, no adenopathy CV:  Heart RRR  Lungs: Fine bibasilar Rales Abd:  abd SNT/ND with normal BS GU:  Bladder non-palpable Extremities:  No LE edema. Skin:  No skin rash  MEDICATIONS:  . amiodarone  200 mg Oral Q M,W,F  . amLODipine  10 mg Oral Daily  . cloNIDine  0.2 mg Oral BID  . feeding supplement (ENSURE ENLIVE)  237 mL Oral BID BM  . hydrALAZINE  100 mg Oral Q8H  . insulin aspart  0-5 Units Subcutaneous QHS  . insulin aspart  0-9 Units Subcutaneous TID WC  . labetalol  200 mg Oral BID  . multivitamin with minerals  1 tablet Oral Daily  . pantoprazole  40 mg Oral BID  . sodium chloride flush  3 mL Intravenous Q12H  . sodium chloride flush  3 mL Intravenous Q12H       LABS:   CBC Latest Ref Rng & Units 12/28/2018 12/27/2018 10/23/2018  WBC 4.0 - 10.5 K/uL 5.8 5.2 4.4  Hemoglobin 12.0 - 15.0 g/dL 8.1(L) 7.7(L) 10.9(L)  Hematocrit 36.0 - 46.0 % 24.4(L) 22.9(L) 34.0  Platelets 150 - 400 K/uL 187 167 167    CMP Latest Ref Rng & Units 12/28/2018 12/27/2018 11/24/2018  Glucose 70 - 99 mg/dL 134(H) 139(H) 154(H)  BUN 8 - 23 mg/dL 44(H) 41(H) 18  Creatinine 0.44 - 1.00 mg/dL 3.92(H) 4.09(H) 1.86(H)  Sodium 135 - 145 mmol/L 138 137 142  Potassium 3.5 - 5.1 mmol/L 4.0 3.9 3.4(L)   Chloride 98 - 111 mmol/L 109 110 105  CO2 22 - 32 mmol/L 16(L) 17(L) 20  Calcium 8.9 - 10.3 mg/dL 8.4(L) 8.2(L) 8.8  Total Protein 6.5 - 8.1 g/dL - 5.4(L) -  Total Bilirubin 0.3 - 1.2 mg/dL - 0.8 -  Alkaline Phos 38 - 126 U/L - 48 -  AST 15 - 41 U/L - 31 -  ALT 0 - 44 U/L - 25 -    Lab Results  Component Value Date   PTH 19 07/14/2018   CALCIUM 8.4 (L) 12/28/2018   PHOS 2.3 (L) 07/14/2018       Component Value Date/Time   COLORURINE YELLOW 12/27/2018 0118   APPEARANCEUR HAZY (A) 12/27/2018 0118   LABSPEC 1.009 12/27/2018 0118   PHURINE 5.0 12/27/2018 0118   GLUCOSEU NEGATIVE 12/27/2018 0118   HGBUR NEGATIVE 12/27/2018 0118   BILIRUBINUR NEGATIVE 12/27/2018 0118   KETONESUR NEGATIVE 12/27/2018 0118   PROTEINUR 100 (A) 12/27/2018 0118   NITRITE NEGATIVE 12/27/2018 0118   LEUKOCYTESUR NEGATIVE 12/27/2018 0118      Component Value Date/Time   PHART 7.412 07/06/2018 0850   PCO2ART 34.9 07/06/2018 0850   PO2ART 206 (H) 07/06/2018 0850  HCO3 21.9 07/06/2018 0850   ACIDBASEDEF 2.1 (H) 07/06/2018 0850   O2SAT 99.9 07/06/2018 0850       Component Value Date/Time   IRON 37 12/28/2018 0538   TIBC 161 (L) 12/28/2018 0538   FERRITIN 566 (H) 12/28/2018 0538   IRONPCTSAT 23 12/28/2018 0538       ASSESSMENT/PLAN:    The patient is a 73 y.o. year old female with a past medical history significant for hypertension, paroxysmal atrial fibrillation on Eliquis, PE, and peptic ulcer disease presented to the [facility] emergency department on 12/18/2018 with epigastric discomfort and nausea and vomiting.  She was found to have an acute urinary tract infection and cholangitis.  She was admitted and was treated with antibiotics and underwent a laparoscopic cholecystectomy.  She developed severe anemia during that hospitalization requiring 2 units of packed red blood cells.  She had just undergone an EGD prior to that admission that was negative, and her fecal occult blood test were also  negative.  Her serum creatinine had worsened to 3.7 from 2.5 on admission (1.82 in May 2020).  A renal ultrasound revealed an atrophic right kidney.  She has gained 11 pounds since her hospitalization.  The hospitalist at [facility] requested further intervention and management by nephrology.  She was subsequently transferred to [facility] for further evaluation.  1.  Chronic kidney disease stage III.  Her baseline serum creatinine was 1.82 in May 2020.  I suspect her chronic kidney disease on the basis of longstanding hypertension.  Will check a urinalysis.  She does have a documented right atrophic kidney, suggesting some renal vascular disease.  2.  Acute kidney injury.  I suspect this is hemodynamic in nature.  She had significant anemia requiring packed RBCs.  She also underwent a cholecystectomy.  While her weight has increased during her hospitalization, she is oxygenating well on room air and does not examine markedly fluid overloaded.  I see no urgent indication for dialysis.  Will monitor urine output closely.    Will restart gentle diuresis.  Creatinine is downtrending.  BUN is stable.  3.  Cholangitis status post cholecystectomy on 12/22/2018.  4.  Hypertension.  Would avoid ACE inhibition in the setting of acute kidney injury.  Heart rate low, so we will decrease clonidine to 0.1 twice daily.  Hydralazine increased late yesterday.  Will add diuretic.  5.  Anemia.  Likely secondary to acute blood loss.  Did received packed red blood cell transfusion at [facility].  Eliquis is currently on hold.    [place], DO, [place]

## 2018-12-28 NOTE — Progress Notes (Signed)
Initial Nutrition Assessment  DOCUMENTATION CODES:   Not applicable  INTERVENTION:    Ensure Enlive po BID, each supplement provides 350 kcal and 20 grams of protein  MVI daily  NUTRITION DIAGNOSIS:   Increased nutrient needs related to post-op healing as evidenced by estimated needs.  GOAL:   Patient will meet greater than or equal to 90% of their needs  MONITOR:   PO intake, Supplement acceptance, Labs, Weight trends, I & O's, Skin  REASON FOR ASSESSMENT:   Malnutrition Screening Tool    ASSESSMENT:   Patient with PMH significant for HTN, DM, CKD III, CHF, and osteoporosis. Admitted to [facility] for management of AKI and cholangitis. Underwent lap chole on 6/16. Transferred to [facility] for further evaluation of AKI on CKD.   RD working remotely.  Pt with delirium this am. Unable to obtain nutrition/weight history at this time. Meal completions charted as 0-40% for pt's last 4 meals. RD to provide supplementation to promote post-op healing.   Pt with noted weight gain from 75.3 on 5/21 to 88.9 kg this admission. Suspect some of this is fluid related.   Generalized +1 pitting edema noted.   I/O: -2,095 ml since admit UOP: 1,075 ml x 24 hrs   Medications: SS novolog Labs: Cr 3.92- trending down Mg 1.6 (L)    Diet Order:   Diet Order            Diet heart healthy/carb modified Room service appropriate? Yes; Fluid consistency: Thin  Diet effective now              EDUCATION NEEDS:   Not appropriate for education at this time  Skin:  Skin Assessment: Skin Integrity Issues: Skin Integrity Issues:: Stage II Stage II: buttocks  Last BM:  6/20  Height:   Ht Readings from Last 1 Encounters:  12/26/18 5\' 6"  (1.676 m)    Weight:   Wt Readings from Last 1 Encounters:  12/28/18 88.9 kg    Ideal Body Weight:  59.1 kg  BMI:  Body mass index is 31.63 kg/m.  Estimated Nutritional Needs:   Kcal:  1650-1850 kcal  Protein:  80-95  grams  Fluid:  >/= 1.6 L/day   Mariana Single RD, LDN Clinical Nutrition Pager # - [phone]

## 2018-12-28 NOTE — Progress Notes (Signed)
Patients niece Gordy Councilman called and stated that she is the healthcare POA and that she has not spoken with a doctor. I paged MD and gave him the nieces number to call her.

## 2018-12-28 NOTE — Plan of Care (Signed)
  Problem: Nutrition: Goal: Adequate nutrition will be maintained Outcome: Progressing   

## 2018-12-28 NOTE — Progress Notes (Signed)
PROGRESS NOTE    Jade Mclean  PJK:[id number] DOB: 05/02/1946 DOA: 12/26/2018 PCP: Algis Greenhouse, MD      Brief Narrative:  Jade Mclean is a 73 y.o. F with hx HTN, DM, CKD baseline 1.8-2.2, pAF on Eliquis, RA previously on Enbrel, hx PE who presented on 6/12 (8 days PTA here) with epigastric discomfort, N/V and found to have cholangitis.  Admitted and treated for cholangitis and "UTI", underwent lap chole and then developed AKI, so transferred to [facility].  Cr 2.4 on admission there, trended up to 3.7.  Renal US showed atrophic right kidney.  Also anemia requiring transfusion 2 units at OSH.       Assessment & Plan: AKI on CKD IV Non-anion gap metabolic acidosis Cr baseline 1.8-2.2.   2.5 on admission to OSH 6/12 Then trended up:  2.7  3.4 (6/14)  3.5 3.7 (day of surgery)  3.7  4.0  3.9  3.7 (on 6/20 in AM)  4.0 on arrival here overnight 6/20  Renal US shows atrophic right kidney per report.  Made good UOP overnight.  Cr slightly down to 3.9, bicarb down to 16, nongap.  Appears to be ishcemic ATN.  Hopefully starting to turn around. -Follow up Story nephrotoxins -Consult Nephrology, appreciate expert guidance -Daily BMP -Strict I/Os   Paroxysmal atrial fibrillation Stable -Continue amiodarone -Hold Eliquis until anemia clearer  Delirium Per reports she had some delirium preoperatively as well as postoperatively.  Overnight last night this worsened.     Delirium precautions:   -Lights and TV off, minimize interruptions at night  -Blinds open and lights on during day  -Frequent reorientation  -PT/OT  -Avoid sedation medications/Beers list medications -Haldol 2 mg PO QHS PRN for agitation, delirium  Anemia Unclear cause.   Has hx PUD but reportedly had a normal EGD shortly prior to admission (report not available) and negative FOBT at OSH x1.   11.2 g/dL on admission to OSH, trended down to 8.1 prior to surgery, then trended further to 6.5  post-surgery.   Received 2 units PRBCs prior to transfer.  Hgb 7.7 on arrival here, less than expected increase in Hgb. Up to 8.1 g/dL today.  Iron studies are replete.  B12 normal.  EPO pending -Transfusion threshold 7 g/dL -Repeat FOBT and if negative, restart Eliquis  Recent cholangitis Completed treatment at [facility].  S/p cholecystectomy on 12/22/18 Afebrile, WBC normal overnight  Hypertension Poorly Controlled overnight -Continue clonidine, amlodipine, hydralazine -Restart labetalol  Diabetes Glucoses good control -Continue SSI correction insulin  RA On Enbrel prior to admission -Hold Enbrel given infection  Stage II pressure injury, L buttocks, POA -WOC consult  Other medications -Continue pantoprazole  Hypomagnesemia Supplemented        MDM and disposition: The below labs and imaging studies were reviewed and summarized above.  Medication management as above.  Patient was admitted with cholangitis, she is now undergone cholecystectomy.  Prior to surgery, the patient started to develop renal failure, and this is worsened postoperatively.  Now appears to be stabilized, hopefully she is in the recovery phase of ATN.  Her urine output is good.  Continue to monitor renal function closely as well as hemoglobin.  Potentially her renal function will start to steadily improve in the next several days we can plan for discharge to rehab in the next 3 to 4 days.       DVT prophylaxis: SCDs Code Status: FULL Family Communication: Patient requested not to call her family  Consultants:   Nephrology  Procedures:   US Renal at OSH  Antimicrobials:   None here    Subjective: Patient is cold, but no fever, chills.  No sweats.  No vomiting, no diarrhea, no increase in abdominal pain.  She was very confused overnight and delirious.       Objective: Vitals:   12/27/18 2132 12/28/18 0351 12/28/18 0606 12/28/18 0900  BP: (!) 187/51 (!) 182/49  (!)  155/65  Pulse: (!) 54 (!) 48  60  Resp: 18 18  16   Temp: 98 F (36.7 C) 98.4 F (36.9 C)  98 F (36.7 C)  TempSrc: Oral Oral  Oral  SpO2: 95% 97%  100%  Weight:   88.9 kg   Height:        Intake/Output Summary (Last 24 hours) at 12/28/2018 1343 Last data filed at 12/28/2018 1340 Gross per 24 hour  Intake 500 ml  Output 1825 ml  Net -1325 ml   Filed Weights   12/26/18 2347 12/27/18 0500 12/28/18 0606  Weight: 86.2 kg 86.2 kg 88.9 kg    Examination: General appearance: Adult female, lying in bed, sleeping, easily arousable, no acute distress, sluggish.   HEENT: Anicteric, conjunctival pink, lids and lashes normal.  No nasal deformity, discharge, or epistaxis.  Lips moist, dentition normal, oropharynx moist, no oral lesions.  Hearing normal Skin: Warm and dry, postsurgical wounds appear clean dry and intact. Cardiac: Regular rate and rhythm, no murmurs, JVP not visible, no lower extremity edema. Respiratory: Respirations shallow due to pain, but no rales or wheezes. Abdomen: Abdomen soft, no tenderness to light palpation, no guarding, no distention, no hepatosplenomegaly. MSK: Classic rheumatoid deformities.  Normal muscle bulk and tone. Neuro: Awake and alert but confused, extraocular movements intact, generally does not follow commands to sluggishness, but appears symmetrically weak in both arms upper extremities.  Speech fluent. Psych: Awakens easily and responds to questions, attention diminished, affect blunted, judgment and insight appear slightly impaired.  Oriented to place, situation.    Data Reviewed: I have personally reviewed following labs and imaging studies:  CBC: Recent Labs  Lab 12/27/18 0038 12/28/18 0538  WBC 5.2 5.8  NEUTROABS 3.9  --   HGB 7.7* 8.1*  HCT 22.9* 24.4*  MCV 87.1 87.8  PLT 167 024   Basic Metabolic Panel: Recent Labs  Lab 12/27/18 0038 12/28/18 0538  NA 137 138  K 3.9 4.0  CL 110 109  CO2 17* 16*  GLUCOSE 139* 134*  BUN 41*  44*  CREATININE 4.09* 3.92*  CALCIUM 8.2* 8.4*  MG 1.6*  --    GFR: Estimated Creatinine Clearance: 14.8 mL/min (A) (by C-G formula based on SCr of 3.92 mg/dL (H)). Liver Function Tests: Recent Labs  Lab 12/27/18 0038  AST 31  ALT 25  ALKPHOS 48  BILITOT 0.8  PROT 5.4*  ALBUMIN 2.3*   No results for input(s): LIPASE, AMYLASE in the last 168 hours. No results for input(s): AMMONIA in the last 168 hours. Coagulation Profile: No results for input(s): INR, PROTIME in the last 168 hours. Cardiac Enzymes: No results for input(s): CKTOTAL, CKMB, CKMBINDEX, TROPONINI in the last 168 hours. BNP (last 3 results) Recent Labs    10/23/18 1020  PROBNP 1,732*   HbA1C: No results for input(s): HGBA1C in the last 72 hours. CBG: Recent Labs  Lab 12/27/18 1130 12/27/18 1616 12/27/18 2132 12/28/18 0638 12/28/18 1124  GLUCAP 118* 115* 102* 120* 128*   Lipid Profile: No results for input(s): CHOL,  HDL, LDLCALC, TRIG, CHOLHDL, LDLDIRECT in the last 72 hours. Thyroid Function Tests: No results for input(s): TSH, T4TOTAL, FREET4, T3FREE, THYROIDAB in the last 72 hours. Anemia Panel: Recent Labs    12/28/18 0538  VITAMINB12 821  FERRITIN 566*  TIBC 161*  IRON 37   Urine analysis:    Component Value Date/Time   COLORURINE YELLOW 12/27/2018 0118   APPEARANCEUR HAZY (A) 12/27/2018 0118   LABSPEC 1.009 12/27/2018 0118   PHURINE 5.0 12/27/2018 0118   GLUCOSEU NEGATIVE 12/27/2018 0118   HGBUR NEGATIVE 12/27/2018 0118   BILIRUBINUR NEGATIVE 12/27/2018 0118   KETONESUR NEGATIVE 12/27/2018 0118   PROTEINUR 100 (A) 12/27/2018 0118   NITRITE NEGATIVE 12/27/2018 0118   LEUKOCYTESUR NEGATIVE 12/27/2018 0118   Sepsis Labs: @LABRCNTIP (procalcitonin:4,lacticacidven:4)  )No results found for this or any previous visit (from the past 240 hour(s)).       Radiology Studies: No results found.      Scheduled Meds: . amiodarone  200 mg Oral Q M,W,F  . amLODipine  10 mg Oral  Daily  . cloNIDine  0.2 mg Oral BID  . hydrALAZINE  100 mg Oral Q8H  . insulin aspart  0-5 Units Subcutaneous QHS  . insulin aspart  0-9 Units Subcutaneous TID WC  . labetalol  200 mg Oral BID  . pantoprazole  40 mg Oral BID  . sodium chloride flush  3 mL Intravenous Q12H  . sodium chloride flush  3 mL Intravenous Q12H   Continuous Infusions: . sodium chloride       LOS: 2 days    Time spent: 25 minutes    Edwin Dada, MD [facility] 12/28/2018, 1:43 PM     Please page through [place]:  [email or web address] Password [email or web address] If 7PM-7AM, please contact night-coverage

## 2018-12-29 LAB — CBC
HCT: 22.1 % — ABNORMAL LOW (ref 36.0–46.0)
Hemoglobin: 7.4 g/dL — ABNORMAL LOW (ref 12.0–15.0)
MCH: 29.6 pg (ref 26.0–34.0)
MCHC: 33.5 g/dL (ref 30.0–36.0)
MCV: 88.4 fL (ref 80.0–100.0)
Platelets: 177 10*3/uL (ref 150–400)
RBC: 2.5 MIL/uL — ABNORMAL LOW (ref 3.87–5.11)
RDW: 16.3 % — ABNORMAL HIGH (ref 11.5–15.5)
WBC: 5.2 10*3/uL (ref 4.0–10.5)
nRBC: 0.4 % — ABNORMAL HIGH (ref 0.0–0.2)

## 2018-12-29 LAB — FOLATE RBC
Folate, Hemolysate: 274 ng/mL
Folate, RBC: 1109 ng/mL (ref >498)
Hematocrit: 24.7 % — ABNORMAL LOW (ref 34.0–46.6)

## 2018-12-29 LAB — BASIC METABOLIC PANEL
Anion gap: 12 (ref 5–15)
BUN: 44 mg/dL — ABNORMAL HIGH (ref 8–23)
CO2: 16 mmol/L — ABNORMAL LOW (ref 22–32)
Calcium: 8.2 mg/dL — ABNORMAL LOW (ref 8.9–10.3)
Chloride: 108 mmol/L (ref 98–111)
Creatinine, Ser: 3.76 mg/dL — ABNORMAL HIGH (ref 0.44–1.00)
GFR calc Af Amer: 13 mL/min — ABNORMAL LOW (ref >60)
GFR calc non Af Amer: 11 mL/min — ABNORMAL LOW (ref >60)
Glucose, Bld: 130 mg/dL — ABNORMAL HIGH (ref 70–99)
Potassium: 4.1 mmol/L (ref 3.5–5.1)
Sodium: 136 mmol/L (ref 135–145)

## 2018-12-29 LAB — GLUCOSE, CAPILLARY
Glucose-Capillary: 130 mg/dL — ABNORMAL HIGH (ref 70–99)
Glucose-Capillary: 117 mg/dL — ABNORMAL HIGH (ref 70–99)
Glucose-Capillary: 106 mg/dL — ABNORMAL HIGH (ref 70–99)

## 2018-12-29 LAB — ERYTHROPOIETIN: Erythropoietin: 15.6 m[IU]/mL (ref 2.6–18.5)

## 2018-12-29 MED ORDER — ISOSORBIDE MONONITRATE ER 30 MG PO TB24
15.0000 mg | ORAL_TABLET | Freq: Every day | ORAL | Status: DC
  Administered 2018-12-29 – 2018-12-31 (×3): 15 mg via ORAL
  Filled 2018-12-29 (×3): qty 1

## 2018-12-29 MED ORDER — CLONIDINE HCL 0.1 MG PO TABS
0.1000 mg | ORAL_TABLET | Freq: Once | ORAL | Status: AC
  Administered 2018-12-29: 0.1 mg via ORAL
  Filled 2018-12-29: qty 1

## 2018-12-29 MED ORDER — SODIUM CHLORIDE 0.9 % IV SOLN
510.0000 mg | Freq: Once | INTRAVENOUS | Status: AC
  Administered 2018-12-29: 510 mg via INTRAVENOUS
  Filled 2018-12-29: qty 17

## 2018-12-29 MED ORDER — CLONIDINE HCL 0.1 MG PO TABS
0.1000 mg | ORAL_TABLET | Freq: Two times a day (BID) | ORAL | Status: DC
  Administered 2018-12-29 – 2018-12-30 (×3): 0.1 mg via ORAL
  Filled 2018-12-29 (×3): qty 1

## 2018-12-29 NOTE — Progress Notes (Signed)
On call provider made aware. Awaiting orders. RN will continue to monitor.     12/29/18 0450  Vitals  BP (!) 176/39  MAP (mmHg) 74  BP Location Left Arm  BP Method Automatic

## 2018-12-29 NOTE — Progress Notes (Signed)
[facility]    NEPHROLOGY PROGRESS NOTE  SUBJECTIVE:  Feels ok today.  Had 1.7 liters UOP over 6/22.  States her BP is quite hard to control.   Review of systems;  Denies n/v Denies shortness of breath or chest pain  OBJECTIVE:  Vitals:   12/29/18 0843 12/29/18 1750  BP: (!) 155/65 (!) 186/50  Pulse: (!) 55 (!) 58  Resp: 20 18  Temp: 98.5 F (36.9 C) 98 F (36.7 C)  SpO2: 100% 94%    Intake/Output Summary (Last 24 hours) at 12/29/2018 1839 Last data filed at 12/29/2018 0601 Gross per 24 hour  Intake 0 ml  Output 50 ml  Net -50 ml      General:  AAOx3 NAD HEENT: MMM [place] AT anicteric sclera CV:  S1S2; no rub Lungs: Fine bibasilar Rales Abd:  abd SNT/ND with normal BS Extremities:  No LE edema. Skin:  No skin rash  MEDICATIONS:  . amiodarone  200 mg Oral Q M,W,F  . amLODipine  10 mg Oral Daily  . cloNIDine  0.1 mg Oral BID  . feeding supplement (ENSURE ENLIVE)  237 mL Oral BID BM  . furosemide  40 mg Intravenous Q12H  . hydrALAZINE  100 mg Oral Q8H  . insulin aspart  0-5 Units Subcutaneous QHS  . insulin aspart  0-9 Units Subcutaneous TID WC  . isosorbide mononitrate  15 mg Oral Daily  . multivitamin with minerals  1 tablet Oral Daily  . pantoprazole  40 mg Oral BID  . sodium chloride flush  3 mL Intravenous Q12H  . sodium chloride flush  3 mL Intravenous Q12H       LABS:   CBC Latest Ref Rng & Units 12/29/2018 12/28/2018 12/28/2018  WBC 4.0 - 10.5 K/uL 5.2 5.8 -  Hemoglobin 12.0 - 15.0 g/dL 7.4(L) 8.1(L) -  Hematocrit 36.0 - 46.0 % 22.1(L) 24.4(L) 24.7(L)  Platelets 150 - 400 K/uL 177 187 -    CMP Latest Ref Rng & Units 12/29/2018 12/28/2018 12/27/2018  Glucose 70 - 99 mg/dL 130(H) 134(H) 139(H)  BUN 8 - 23 mg/dL 44(H) 44(H) 41(H)  Creatinine 0.44 - 1.00 mg/dL 3.76(H) 3.92(H) 4.09(H)  Sodium 135 - 145 mmol/L 136 138 137  Potassium 3.5 - 5.1 mmol/L 4.1 4.0 3.9  Chloride 98 - 111 mmol/L 108 109 110  CO2 22 - 32 mmol/L 16(L) 16(L) 17(L)   Calcium 8.9 - 10.3 mg/dL 8.2(L) 8.4(L) 8.2(L)  Total Protein 6.5 - 8.1 g/dL - - 5.4(L)  Total Bilirubin 0.3 - 1.2 mg/dL - - 0.8  Alkaline Phos 38 - 126 U/L - - 48  AST 15 - 41 U/L - - 31  ALT 0 - 44 U/L - - 25    Lab Results  Component Value Date   PTH 19 07/14/2018   CALCIUM 8.2 (L) 12/29/2018   PHOS 2.3 (L) 07/14/2018       Component Value Date/Time   COLORURINE YELLOW 12/27/2018 0118   APPEARANCEUR HAZY (A) 12/27/2018 0118   LABSPEC 1.009 12/27/2018 0118   PHURINE 5.0 12/27/2018 0118   GLUCOSEU NEGATIVE 12/27/2018 0118   HGBUR NEGATIVE 12/27/2018 0118   BILIRUBINUR NEGATIVE 12/27/2018 0118   KETONESUR NEGATIVE 12/27/2018 0118   PROTEINUR 100 (A) 12/27/2018 0118   NITRITE NEGATIVE 12/27/2018 0118   LEUKOCYTESUR NEGATIVE 12/27/2018 0118      Component Value Date/Time   PHART 7.412 07/06/2018 0850   PCO2ART 34.9 07/06/2018 0850   PO2ART 206 (H) 07/06/2018 0850  HCO3 21.9 07/06/2018 0850   ACIDBASEDEF 2.1 (H) 07/06/2018 0850   O2SAT 99.9 07/06/2018 0850       Component Value Date/Time   IRON 37 12/28/2018 0538   TIBC 161 (L) 12/28/2018 0538   FERRITIN 566 (H) 12/28/2018 0538   IRONPCTSAT 23 12/28/2018 0538       ASSESSMENT/PLAN:    The patient is a 73 y.o. year old female with a past medical history significant for hypertension, paroxysmal atrial fibrillation on Eliquis, PE, and peptic ulcer disease presented to the [facility] emergency department on 12/18/2018 with epigastric discomfort and nausea and vomiting.  She was found to have an acute urinary tract infection and cholangitis.  She was admitted and was treated with antibiotics and underwent a laparoscopic cholecystectomy.  She developed severe anemia during that hospitalization requiring 2 units of packed red blood cells.  She had just undergone an EGD prior to that admission that was negative, and her fecal occult blood test were also negative.  Her serum creatinine had worsened to 3.7 from 2.5 on admission (1.82  in May 2020).  A renal ultrasound revealed an atrophic right kidney.  She has gained 11 pounds since her hospitalization.  The hospitalist at [facility] requested further intervention and management by nephrology.  She was subsequently transferred to [facility] for further evaluation.  1.  Acute kidney injury 2/2 hemodynamically mediated with pre-renal and ischemic insults.  Hx significant anemia requiring packed RBCs.  She also underwent a cholecystectomy.  While her weight has increased during her hospitalization, she is oxygenating well on room air and does not examine markedly fluid overloaded.  Discontinue lasix for now.  No hematuria and 100 mg/dL protein.  Check up/cr ratio.  Creatinine is improving.    2.  Chronic kidney disease stage III.  Her baseline serum creatinine was 1.82 in May 2020.  Secondary in part to HTN as well as decreased nephron mass with right atrophic kidney, suggesting some renal vascular disease  3.  Cholangitis status post cholecystectomy on 12/22/2018.  4.  Hypertension.  Would avoid ACE inhibition in the setting of acute kidney injury.  Clonidine was decreased due to bradycardia.  Hydralazine increased.  Discontinue lasix for now (got two doses on 6/23).  Note imdur added.  Off labetalol for bradycardia, as well.  On amlodipine.  Hope to be able to increase clonidine back once beta blocker is out of system (bradycardia).  Clonidine once now.   5.  Anemia.  Likely secondary to acute blood loss.  Did received packed red blood cell transfusion at [facility].  Eliquis is currently on hold.  No acute indication for PRBC's but would keep Hb above 7.  Feraheme x 1

## 2018-12-29 NOTE — Progress Notes (Signed)
Patient was confused this morning not sure why she was here and how she got here. She was updated to those questions. Jade Mclean was called and was updated about her aunt. [place]

## 2018-12-29 NOTE — Plan of Care (Signed)
  Problem: Activity: Goal: Risk for activity intolerance will decrease Outcome: Progressing   

## 2018-12-29 NOTE — Progress Notes (Signed)
Occupational Therapy Evaluation Patient Details Name: [facility] MRN: [id number] DOB: 1945-07-09 Today's Date: 12/29/2018    History of Present Illness Mrs. Hornbaker is a 73 y.o. female with PMH of hypertension, paroxysmal atrial fibrillation on Eliquis,  PE, peptic ulcer disease, presenting from  [facility] ED while transferred to [facility] for management of acute kidney injury.    Clinical Impression   Pt presents with above description. PTA pt PLOF as mentioned in chart review of mod I with use of AE. Pt observed to be a poor historian, stating she lives with brothers and is "here for surgery", Ox1/ Pt is pleasantly confused and continuously mentions pain in opposite LE due to RA. Pt currently requires Max A in LB dressing, Mod A (+2 for safety bed mobility) due to BUE weakness. Pt will benefit from continued acute OT to address deficits and maximize independence in ADLs prior to DC to SNF setting. OT will continue to follow acutely.     Follow Up Recommendations  SNF;Supervision/Assistance - 24 hour    Equipment Recommendations  3 in 1 bedside commode    Recommendations for Other Services       Precautions / Restrictions Restrictions Weight Bearing Restrictions: No      Mobility Bed Mobility Overal bed mobility: Needs Assistance Bed Mobility: Supine to Sit     Supine to sit: Mod assist     General bed mobility comments: bed pad used to adjust sitting at EOB. required assistance to elevate trunk due to BUE weakness.  Transfers                 General transfer comment: deferred    Balance Overall balance assessment: Needs assistance Sitting-balance support: Bilateral upper extremity supported;Feet supported Sitting balance-Leahy Scale: Poor                                     ADL either performed or assessed with clinical judgement   ADL Overall ADL's : Needs assistance/impaired Eating/Feeding: Set up;Sitting   Grooming: Set  up;Wash/dry hands;Wash/dry face;Oral care;Sitting   Upper Body Bathing: Minimal assistance;Cueing for sequencing;Sitting   Lower Body Bathing: Maximal assistance   Upper Body Dressing : Minimal assistance   Lower Body Dressing: Maximal assistance;Sitting/lateral leans   Toilet Transfer: Maximal assistance;+2 for physical assistance;+2 for safety/equipment Toilet Transfer Details (indicate cue type and reason): Pt attempted 2 trials to perform simulated toilet transfer to recliner. Max A+2 required, unsuccessful due to weakness to stand up right and pain limiting engagement.  Toileting- Clothing Manipulation and Hygiene: Maximal assistance;Bed level Toileting - Clothing Manipulation Details (indicate cue type and reason): During transfer from supine to sit at EOB. Pt observed to have incontinence while entering in bed, unaware.        General ADL Comments: transferred deferred due to weakness and inability to tolerated standing with +2 assist.      Vision         Perception     Praxis      Pertinent Vitals/Pain Pain Assessment: Faces Faces Pain Scale: Hurts little more Pain Location: BLE Pain Descriptors / Indicators: Aching Pain Intervention(s): Limited activity within patient's tolerance;Monitored during session;Repositioned     Hand Dominance     Extremity/Trunk Assessment Upper Extremity Assessment Upper Extremity Assessment: Generalized weakness   Lower Extremity Assessment Lower Extremity Assessment: Defer to PT evaluation       Communication Communication Communication: No difficulties  Cognition Arousal/Alertness: Awake/alert Behavior During Therapy: WFL for tasks assessed/performed Overall Cognitive Status: Impaired/Different from baseline Area of Impairment: Orientation;Memory;Safety/judgement                 Orientation Level: Disoriented to;Place;Situation;Time(reports being in hospital for surgery.)   Memory: Decreased short-term memory    Safety/Judgement: Decreased awareness of safety;Decreased awareness of deficits     General Comments: Pt pleasantly confused required cues and reminders for engagement.    General Comments       Exercises     Shoulder Instructions      Home Living Family/patient expects to be discharged to:: Private residence Living Arrangements: Other relatives Available Help at Discharge: Family Type of Home: House Home Access: Stairs to enter [employer] of Steps: 2 Entrance Stairs-Rails: None Home Layout: One level               Home Equipment: [place] - single point;Walker - 2 wheels;Wheelchair - manual   Additional Comments: Information obtained from prior admission. Pt is observed to be a poor historian, demonstrating confusion and state of condition.       Prior Functioning/Environment Level of Independence: Needs assistance  Gait / Transfers Assistance Needed: walker ADL's / Homemaking Assistance Needed: requires assistance from brother with LB dressing and bathing            OT Problem List: Decreased strength;Decreased activity tolerance;Impaired balance (sitting and/or standing);Decreased safety awareness;Decreased knowledge of use of DME or AE;Decreased knowledge of precautions      OT Treatment/Interventions: Self-care/ADL training;Therapeutic exercise;DME and/or AE instruction;Therapeutic activities;Patient/family education;Balance training    OT Goals(Current goals can be found in the care plan section) Acute Rehab OT Goals Patient Stated Goal: no goal stated OT Goal Formulation: Patient unable to participate in goal setting Time For Goal Achievement: 01/12/19 Potential to Achieve Goals: Fair  OT Frequency: Min 2X/week   Barriers to D/C:            Co-evaluation              AM-PAC OT "6 Clicks" Daily Activity     Outcome Measure Help from another person eating meals?: A Little Help from another person taking care of personal grooming?: A  Little Help from another person toileting, which includes using toliet, bedpan, or urinal?: A Lot Help from another person bathing (including washing, rinsing, drying)?: A Lot Help from another person to put on and taking off regular upper body clothing?: A Little Help from another person to put on and taking off regular lower body clothing?: A Lot 6 Click Score: 15   End of Session Equipment Utilized During Treatment: Gait belt;Rolling walker Nurse Communication: Mobility status  Activity Tolerance: Patient limited by pain Patient left: in bed;with call bell/phone within reach;with nursing/sitter in room  OT Visit Diagnosis: Unsteadiness on feet (R26.81);Muscle weakness (generalized) (M62.81)                Time: 1610-9604 OT Time Calculation (min): 19 min Charges:  OT General Charges $OT Visit: 1 Visit OT Evaluation $OT Eval Moderate Complexity: [place], MSOT, OTR/L  Supplemental Rehabilitation Services  [phone]   [name] 12/29/2018, 11:42 AM

## 2018-12-29 NOTE — Progress Notes (Signed)
PROGRESS NOTE    Alinda Egolf  AST:[id number] DOB: 29-Jul-1945 DOA: 12/26/2018 PCP: Algis Greenhouse, MD      Brief Narrative:  [facility] is a 73 y.o. F with hx HTN, DM, CKD baseline 1.8-2.2, pAF on Eliquis, RA previously on Enbrel, hx PE who presented on 6/12 (8 days PTA here) with epigastric discomfort, N/V and found to have cholangitis.  Admitted and treated for cholangitis and "UTI", underwent lap chole and then developed AKI, so transferred to [facility].  Cr 2.4 on admission there, trended up to 3.7.  Renal US showed atrophic right kidney.  Also anemia requiring transfusion 2 units at OSH.       Assessment & Plan: AKI on CKD IV Non-anion gap metabolic acidosis Cr baseline 1.8-2.2.   2.5 on admission to OSH 6/12, trended up to 4.0.  UOP good, Cr trending down last 2 days.  K normal.  Bicarb 16.  Renal US at OSH showed atrophic right kidney  -Avoid nephrotoxins -Consult Nephrology, appreciate expert guidance -Daily BMP -Strict I/Os   Paroxysmal atrial fibrillation Stable -Continue amiodarone -Hold Eliquis until anemia clearer -FOBT pending  Bradycardia HR 40s overnight after restarting home labetalol. -Hold labetalol  Delirium Mild overnight.  No Haldol given. -Haldol 2 mg PO QHS PRN for agitation, delirium  Delirium precautions:   -Lights and TV off, minimize interruptions at night  -Blinds open and lights on during day  -Frequent reorientation  -PT/OT  -Avoid sedation medications/Beers list medications  Anemia Unclear cause.   Has hx PUD but reportedly had a normal EGD shortly prior to admission (report not available) and negative FOBT at OSH x1.   11.2 g/dL on admission to OSH, trended down to 8.1 prior to surgery, then trended further to 6.5 post-surgery.   Received 2 units PRBCs prior to transfer.  Hgb 7.7 on arrival here, less than expected increase in Hgb. Fluctuating 7s to 8s.    Iron studies are replete.  B12 normal.  EPO pending -Transfusion  threshold 7 g/dL -Repeat FOBT and if negative, restart Eliquis -Follow EPO level  Recent cholangitis Completed treatment at [facility].  S/p cholecystectomy on 12/22/18 Afebrile. WBC remains normal.  Hypertension Still severe range -Continue clonidine, amlodipine, hydralazine -Continue Lasix -Stop labetalol -Start Imdur, risks and benefits discussed with patient, niece  Diabetes Glucose normal, no hypoglycemia today -Continue SSI correction insulin  RA On Enbrel prior to admission -Hold Enbrel given infection  Stage II pressure injury, L buttocks, POA -WOC consult  Other medications -Continue pantoprazole  Hypomagnesemia Supplemented        MDM and disposition: Imaging studies reviewed and summarized above.  Medication management as above.  The patient is admitted with cholangitis, and is now undergone cholecystectomy.  Perioperatively, she has developed worsening renal failure, probably from ATN.  However her urine output is good, and creatinine started to improve.  We will continue to watch her renal function and hemoglobin closely.  If this remained stable over the next several days, we may be able to plan for discharge in the next 3 to 4 days.       DVT prophylaxis: SCDs Code Status: FULL Family Communication: Niece by phone    Consultants:   Nephrology  Procedures:   US Renal at OSH  Antimicrobials:   None here    Subjective: No fever, chills, sweats.  Vomited once today no diarrhea, abdominal pain.  No cough, dyspnea.  Mildly disoriented overnight, oriented today.       Objective: Vitals:  12/29/18 0450 12/29/18 0500 12/29/18 0638 12/29/18 0843  BP: (!) 176/39  (!) 178/45 (!) 155/65  Pulse: (!) 44  (!) 42 (!) 55  Resp: (!) 21   20  Temp: 98.6 F (37 C)   98.5 F (36.9 C)  TempSrc: Oral   Oral  SpO2: 95%  97% 100%  Weight:  87.5 kg    Height:        Intake/Output Summary (Last 24 hours) at 12/29/2018 1502 Last data filed at  12/29/2018 0601 Gross per 24 hour  Intake 0 ml  Output 50 ml  Net -50 ml   Filed Weights   12/28/18 0606 12/28/18 2013 12/29/18 0500  Weight: 88.9 kg 87.5 kg 87.5 kg    Examination: General appearance: Adult female, lying in bed, sluggish, easily arousable, no acute distress. HEENT: Anicteric, conjunctival pink, lids and lashes normal.  No nasal deformity, discharge, or epistaxis.  Lips moist, dentition normal, oropharynx moist, no oral lesions.  Hearing normal Skin: Warm and dry, postsurgical wounds appear clean dry and intact. Cardiac: Regular rate and rhythm, no murmurs, JVP not visible, no lower extremity edema. Respiratory: Respirations shallow, no rales or wheezes. Abdomen: Soft, no tenderness to light palpation, discomfort with deeper palpation, nonfocal.  No rigidity or guarding MSK: Classic rheumatoid deformities, normal muscle bulk and tone for age. Neuro: Sleeping but easily arousable, somewhat slowed psychomotor, but no confusion, oriented to person, place, and time.  Sluggish and weak but symmetric strength in bilateral upper and lower extremities.  Speech fluent.Marland Kitchen Psych: Attention diminished, affect blunted, judgment and insight appear slightly impaired, oriented to place, situation.  Provides consistent, logical and goal-directed answers to questions, just slowed psychomotor.   Data Reviewed: I have personally reviewed following labs and imaging studies:  CBC: Recent Labs  Lab 12/27/18 0038 12/28/18 0538 12/29/18 0542  WBC 5.2 5.8 5.2  NEUTROABS 3.9  --   --   HGB 7.7* 8.1* 7.4*  HCT 22.9* 24.4*  24.7* 22.1*  MCV 87.1 87.8 88.4  PLT 167 187 536   Basic Metabolic Panel: Recent Labs  Lab 12/27/18 0038 12/28/18 0538 12/29/18 0542  NA 137 138 136  K 3.9 4.0 4.1  CL 110 109 108  CO2 17* 16* 16*  GLUCOSE 139* 134* 130*  BUN 41* 44* 44*  CREATININE 4.09* 3.92* 3.76*  CALCIUM 8.2* 8.4* 8.2*  MG 1.6*  --   --    GFR: Estimated Creatinine Clearance: 15.3  mL/min (A) (by C-G formula based on SCr of 3.76 mg/dL (H)). Liver Function Tests: Recent Labs  Lab 12/27/18 0038  AST 31  ALT 25  ALKPHOS 48  BILITOT 0.8  PROT 5.4*  ALBUMIN 2.3*   No results for input(s): LIPASE, AMYLASE in the last 168 hours. No results for input(s): AMMONIA in the last 168 hours. Coagulation Profile: No results for input(s): INR, PROTIME in the last 168 hours. Cardiac Enzymes: No results for input(s): CKTOTAL, CKMB, CKMBINDEX, TROPONINI in the last 168 hours. BNP (last 3 results) Recent Labs    10/23/18 1020  PROBNP 1,732*   HbA1C: No results for input(s): HGBA1C in the last 72 hours. CBG: Recent Labs  Lab 12/28/18 1124 12/28/18 1643 12/28/18 2026 12/29/18 0648 12/29/18 1132  GLUCAP 128* 115* 137* 130* 117*   Lipid Profile: No results for input(s): CHOL, HDL, LDLCALC, TRIG, CHOLHDL, LDLDIRECT in the last 72 hours. Thyroid Function Tests: No results for input(s): TSH, T4TOTAL, FREET4, T3FREE, THYROIDAB in the last 72 hours. Anemia Panel: Recent  Labs    12/28/18 0538  VITAMINB12 821  FERRITIN 566*  TIBC 161*  IRON 37   Urine analysis:    Component Value Date/Time   COLORURINE YELLOW 12/27/2018 0118   APPEARANCEUR HAZY (A) 12/27/2018 0118   LABSPEC 1.009 12/27/2018 0118   PHURINE 5.0 12/27/2018 0118   GLUCOSEU NEGATIVE 12/27/2018 0118   HGBUR NEGATIVE 12/27/2018 0118   BILIRUBINUR NEGATIVE 12/27/2018 0118   KETONESUR NEGATIVE 12/27/2018 0118   PROTEINUR 100 (A) 12/27/2018 0118   NITRITE NEGATIVE 12/27/2018 0118   LEUKOCYTESUR NEGATIVE 12/27/2018 0118   Sepsis Labs: @LABRCNTIP (procalcitonin:4,lacticacidven:4)  )No results found for this or any previous visit (from the past 240 hour(s)).       Radiology Studies: No results found.      Scheduled Meds: . amiodarone  200 mg Oral Q M,W,F  . amLODipine  10 mg Oral Daily  . cloNIDine  0.1 mg Oral BID  . feeding supplement (ENSURE ENLIVE)  237 mL Oral BID BM  . furosemide   40 mg Intravenous Q12H  . hydrALAZINE  100 mg Oral Q8H  . insulin aspart  0-5 Units Subcutaneous QHS  . insulin aspart  0-9 Units Subcutaneous TID WC  . isosorbide mononitrate  15 mg Oral Daily  . multivitamin with minerals  1 tablet Oral Daily  . pantoprazole  40 mg Oral BID  . sodium chloride flush  3 mL Intravenous Q12H  . sodium chloride flush  3 mL Intravenous Q12H   Continuous Infusions: . sodium chloride       LOS: 3 days    Time spent: 25 minutes    Edwin Dada, MD [facility] 12/29/2018, 3:02 PM     Please page through [place]:  [email or web address] Password [email or web address] If 7PM-7AM, please contact night-coverage

## 2018-12-29 NOTE — Care Management Important Message (Signed)
Important Message  Patient Details  Name: Jade Mclean MRN: [id number] Date of Birth: 1945-12-15   Medicare Important Message Given:  Yes  Due to illness patient was not able to sign.  Unsigned copy left.   Kaity Pitstick 12/29/2018, 12:14 PM

## 2018-12-29 NOTE — Evaluation (Addendum)
Physical Therapy Evaluation Patient Details Name: Jade Mclean MRN: [id number] DOB: 1946-01-02 Today's Date: 12/29/2018   History of Present Illness  Jade Mclean is a 73 y.o. female with PMH of hypertension, paroxysmal atrial fibrillation on Eliquis,  PE, peptic ulcer disease, presenting from  [facility] ED while transferred to [facility] for management of acute kidney injury.     Clinical Impression  Pt admitted with above diagnosis. Pt currently with functional limitations due to the deficits listed below (see PT Problem List). PTA, pt reports living with her brother, able to transfer on and off w/c with his assistance. Today, pt confused and fixated on eating her dessert. Worked on bed mobility and repositioning in bed, patient requiring mod to max A. States she is too weak to attempt standing today. Agree for SNF placement at this time. Pt will benefit from skilled PT to increase their independence and safety with mobility to allow discharge to the venue listed below.       Follow Up Recommendations SNF    Equipment Recommendations    TBD next venue    Recommendations for Other Services       Precautions / Restrictions Restrictions Weight Bearing Restrictions: No      Mobility  Bed Mobility Overal bed mobility: Needs Assistance Bed Mobility: Supine to Sit     Supine to sit: Mod assist     General bed mobility comments: bed pad used to adjust sitting at EOB. required assistance to elevate trunk due to BUE weakness.  Transfers                 General transfer comment: deferred  Ambulation/Gait                Stairs            Wheelchair Mobility    Modified Rankin (Stroke Patients Only)       Balance Overall balance assessment: Needs assistance Sitting-balance support: Bilateral upper extremity supported;Feet supported Sitting balance-Leahy Scale: Poor                                       Pertinent  Vitals/Pain Pain Assessment: Faces Faces Pain Scale: Hurts little more Pain Location: BLE Pain Descriptors / Indicators: Aching    Home Living Family/patient expects to be discharged to:: Private residence Living Arrangements: Other relatives Available Help at Discharge: Family Type of Home: House Home Access: Stairs to enter Entrance Stairs-Rails: None Entrance Stairs-Number of Steps: 2 Home Layout: One level Home Equipment: Cane - single point;Walker - 2 wheels;Wheelchair - manual Additional Comments: Information obtained from prior admission. Pt is observed to be a poor historian, demonstrating confusion and state of condition.     Prior Function Level of Independence: Needs assistance   Gait / Transfers Assistance Needed: walker  ADL's / Homemaking Assistance Needed: requires assistance from brother with LB dressing and bathing        Hand Dominance        Extremity/Trunk Assessment   Upper Extremity Assessment Upper Extremity Assessment: Generalized weakness    Lower Extremity Assessment Lower Extremity Assessment: Generalized weakness       Communication   Communication: No difficulties  Cognition Arousal/Alertness: Awake/alert Behavior During Therapy: WFL for tasks assessed/performed Overall Cognitive Status: Impaired/Different from baseline Area of Impairment: Orientation;Memory;Safety/judgement                 Orientation  Level: Disoriented to;Place;Situation;Time(reports being in [facility] for surgery.)   Memory: Decreased short-term memory   Safety/Judgement: Decreased awareness of safety;Decreased awareness of deficits     General Comments: Pt pleasantly confused required cues and reminders for engagement.       General Comments      Exercises     Assessment/Plan    PT Assessment Patient needs continued PT services  PT Problem List Decreased strength       PT Treatment Interventions DME instruction;Gait training;Stair  training;Functional mobility training;Therapeutic activities;Therapeutic exercise;Balance training    PT Goals (Current goals can be found in the Care Plan section)  Acute Rehab PT Goals Patient Stated Goal: no goal stated PT Goal Formulation: With patient Potential to Achieve Goals: Fair    Frequency Min 2X/week   Barriers to discharge        Co-evaluation               AM-PAC PT "6 Clicks" Mobility  Outcome Measure Help needed turning from your back to your side while in a flat bed without using bedrails?: A Lot Help needed moving from lying on your back to sitting on the side of a flat bed without using bedrails?: A Lot Help needed moving to and from a bed to a chair (including a wheelchair)?: A Lot Help needed standing up from a chair using your arms (e.g., wheelchair or bedside chair)?: A Lot Help needed to walk in [facility] room?: Total Help needed climbing 3-5 steps with a railing? : Total 6 Click Score: 10    End of Session   Activity Tolerance: Patient tolerated treatment well Patient left: in bed;with call bell/phone within reach Nurse Communication: Mobility status PT Visit Diagnosis: Unsteadiness on feet (R26.81)    Time: 3748-2707 PT Time Calculation (min) (ACUTE ONLY): 20 min   Charges:   PT Evaluation $PT Eval Moderate Complexity: 1 Mod          Reinaldo Berber, PT, DPT Acute Rehabilitation Services Pager: [phone] Office: [phone]   Reinaldo Berber 12/29/2018, 5:09 PM

## 2018-12-30 LAB — GLUCOSE, CAPILLARY
Glucose-Capillary: 119 mg/dL — ABNORMAL HIGH (ref 70–99)
Glucose-Capillary: 108 mg/dL — ABNORMAL HIGH (ref 70–99)
Glucose-Capillary: 114 mg/dL — ABNORMAL HIGH (ref 70–99)
Glucose-Capillary: 112 mg/dL — ABNORMAL HIGH (ref 70–99)
Glucose-Capillary: 113 mg/dL — ABNORMAL HIGH (ref 70–99)

## 2018-12-30 LAB — CBC
HCT: 22.1 % — ABNORMAL LOW (ref 36.0–46.0)
Hemoglobin: 7.3 g/dL — ABNORMAL LOW (ref 12.0–15.0)
MCH: 29 pg (ref 26.0–34.0)
MCHC: 33 g/dL (ref 30.0–36.0)
MCV: 87.7 fL (ref 80.0–100.0)
Platelets: 188 K/uL (ref 150–400)
RBC: 2.52 MIL/uL — ABNORMAL LOW (ref 3.87–5.11)
RDW: 16.1 % — ABNORMAL HIGH (ref 11.5–15.5)
WBC: 5.8 K/uL (ref 4.0–10.5)
nRBC: 0.3 % — ABNORMAL HIGH (ref 0.0–0.2)

## 2018-12-30 LAB — BASIC METABOLIC PANEL WITH GFR
Anion gap: 11 (ref 5–15)
BUN: 43 mg/dL — ABNORMAL HIGH (ref 8–23)
CO2: 18 mmol/L — ABNORMAL LOW (ref 22–32)
Calcium: 8.6 mg/dL — ABNORMAL LOW (ref 8.9–10.3)
Chloride: 108 mmol/L (ref 98–111)
Creatinine, Ser: 3.53 mg/dL — ABNORMAL HIGH (ref 0.44–1.00)
GFR calc Af Amer: 14 mL/min — ABNORMAL LOW (ref >60)
GFR calc non Af Amer: 12 mL/min — ABNORMAL LOW (ref >60)
Glucose, Bld: 110 mg/dL — ABNORMAL HIGH (ref 70–99)
Potassium: 3.7 mmol/L (ref 3.5–5.1)
Sodium: 137 mmol/L (ref 135–145)

## 2018-12-30 MED ORDER — CLONIDINE HCL 0.2 MG PO TABS
0.2000 mg | ORAL_TABLET | Freq: Two times a day (BID) | ORAL | Status: DC
  Administered 2018-12-30 – 2019-01-05 (×11): 0.2 mg via ORAL
  Filled 2018-12-30 (×12): qty 1

## 2018-12-30 MED ORDER — SODIUM CHLORIDE 0.9 % IV SOLN
INTRAVENOUS | Status: AC
  Administered 2018-12-30: 20:00:00 via INTRAVENOUS

## 2018-12-30 MED ORDER — HYDRALAZINE HCL 20 MG/ML IJ SOLN
5.0000 mg | Freq: Four times a day (QID) | INTRAMUSCULAR | Status: DC | PRN
  Administered 2018-12-31: 5 mg via INTRAVENOUS
  Filled 2018-12-30: qty 1

## 2018-12-30 NOTE — NC FL2 (Signed)
[place] MEDICAID FL2 LEVEL OF CARE SCREENING TOOL     IDENTIFICATION  Patient Name: Jade Mclean Birthdate: 11-Oct-1945 Sex: female Admission Date (Current Location): 12/26/2018  [facility] and [place] Number:  Herbalist and Address:  [facility] [place], [place] [address] [place], [place] [address]      Provider Number: [id number]  Attending Physician Name and Address:  Mariel Aloe, MD  Relative Name and Phone Number:  Bertram Millard - [phone]; Noreen Mackintosh- brother, [phone]    Current Level of Care: Hospital Recommended Level of Care: [place] Prior Approval Number:    Date Approved/Denied:   PASRR Number: [id number]  Discharge Plan: SNF    Current Diagnoses: Patient Active Problem List   Diagnosis Date Noted  . Acute renal failure superimposed on stage 4 chronic kidney disease ([place]) 12/26/2018  . Anemia 12/26/2018  . Ascending cholangitis 12/26/2018  . Accelerated hypertension 12/26/2018  . History of pulmonary embolism 12/26/2018  . PUD (peptic ulcer disease) 12/26/2018  . Chronic anticoagulation 11/05/2018  . Hypokalemia 10/26/2018  . PAF (paroxysmal atrial fibrillation) ([place]) 09/15/2018  . On amiodarone therapy 09/15/2018  . Pulmonary embolism ([place]) 09/15/2018  . Acute blood loss anemia   . GI bleeding 07/05/2018  . Fatigue 05/22/2018  . Obesity (BMI 30-39.9) 09/18/2017  . CKD (chronic kidney disease) stage 4, GFR 15-29 ml/min (HCC) 02/20/2016  . Osteoporosis 02/20/2016  . Hypertensive heart disease 08/16/2015  . Chronic rheumatic arthritis ([place]) 08/16/2015  . Mixed hyperlipidemia 08/16/2015  . Type 2 diabetes mellitus without complication, without long-term current use of insulin ([place]) 08/16/2015    Orientation RESPIRATION BLADDER Height & Weight     Self, Place  Normal Continent, External catheter(placed 6/22) Weight: 191 lb 12.8 oz (87 kg) Height:  5\' 6"  (167.6 cm)  BEHAVIORAL  SYMPTOMS/MOOD NEUROLOGICAL BOWEL NUTRITION STATUS        Diet(Heart health - carb modified)  AMBULATORY STATUS COMMUNICATION OF NEEDS Skin   (Patient was unable to ambulate with PT during eval on 6/23)   Other (Comment)(MASD to right buttocks with foam dressing; Stage 2 pressure injury to left buttock)                       Personal Care Assistance Level of Assistance  Bathing, Feeding, Dressing Bathing Assistance: Maximum assistance(Upper body bathing min assist) Feeding assistance: Independent(Assistance with set-up) Dressing Assistance: Maximum assistance(Upper body dressing min assist)     Functional Limitations Info  Sight, Hearing, Speech Sight Info: Impaired(wears glasses) Hearing Info: Adequate Speech Info: Adequate    SPECIAL CARE FACTORS FREQUENCY  PT (By licensed PT), OT (By licensed OT)     PT Frequency: Evaluation 6/23 at hospital. PT at SNF Eval and Treat OT Frequency: Evaluation 6/23 at hospital. OT at SNF Eval and Treat            Contractures Contractures Info: Not present    Additional Factors Info  Code Status, Allergies, Insulin Sliding Scale Code Status Info: Full Allergies Info: Colesevelam, Dulaglutide, Nsaids, Rosuvastatin, Simvastin   Insulin Sliding Scale Info: 0-9 Units 3 times per day with meals; 0-5 Units daily at bedtime       Current Medications (12/30/2018):  This is the current hospital active medication list Current Facility-Administered Medications  Medication Dose Route Frequency Provider Last Rate Last Dose  . 0.9 %  sodium chloride infusion  250 mL Intravenous PRN Opyd, Ilene Qua, MD      .  acetaminophen (TYLENOL) tablet 650 mg  650 mg Oral Q6H PRN Opyd, Ilene Qua, MD       Or  . acetaminophen (TYLENOL) suppository 650 mg  650 mg Rectal Q6H PRN Opyd, Ilene Qua, MD      . amiodarone (PACERONE) tablet 200 mg  200 mg Oral Q M,W,F Opyd, Ilene Qua, MD   200 mg at 12/28/18 1027  . amLODipine (NORVASC) tablet 10 mg  10 mg Oral Daily  Opyd, Ilene Qua, MD   10 mg at 12/29/18 1019  . cloNIDine (CATAPRES) tablet 0.1 mg  0.1 mg Oral BID Edwin Dada, MD   0.1 mg at 12/29/18 2105  . feeding supplement (ENSURE ENLIVE) (ENSURE ENLIVE) liquid 237 mL  237 mL Oral BID BM Danford, Christopher P, MD      . haloperidol (HALDOL) tablet 2 mg  2 mg Oral QHS PRN Danford, Suann Larry, MD      . hydrALAZINE (APRESOLINE) tablet 100 mg  100 mg Oral Q8H Finnigan, Nancy A, DO   100 mg at 12/30/18 0840  . insulin aspart (novoLOG) injection 0-5 Units  0-5 Units Subcutaneous QHS Opyd, Timothy S, MD      . insulin aspart (novoLOG) injection 0-9 Units  0-9 Units Subcutaneous TID WC Opyd, Ilene Qua, MD   1 Units at 12/29/18 0835  . isosorbide mononitrate (IMDUR) 24 hr tablet 15 mg  15 mg Oral Daily Danford, Suann Larry, MD   15 mg at 12/29/18 1015  . multivitamin with minerals tablet 1 tablet  1 tablet Oral Daily Danford, Suann Larry, MD   1 tablet at 12/29/18 1019  . ondansetron (ZOFRAN) tablet 4 mg  4 mg Oral Q6H PRN Opyd, Ilene Qua, MD       Or  . ondansetron (ZOFRAN) injection 4 mg  4 mg Intravenous Q6H PRN Opyd, Ilene Qua, MD      . pantoprazole (PROTONIX) EC tablet 40 mg  40 mg Oral BID Opyd, Ilene Qua, MD   40 mg at 12/29/18 2105  . sodium chloride flush (NS) 0.9 % injection 3 mL  3 mL Intravenous Q12H Opyd, Ilene Qua, MD   3 mL at 12/29/18 1807  . sodium chloride flush (NS) 0.9 % injection 3 mL  3 mL Intravenous Q12H Opyd, Ilene Qua, MD   3 mL at 12/29/18 2105  . sodium chloride flush (NS) 0.9 % injection 3 mL  3 mL Intravenous PRN Opyd, Ilene Qua, MD         Discharge Medications: Please see discharge summary for a list of discharge medications.  Relevant Imaging Results:  Relevant Lab Results:   Additional Information WF#[id number]  Sable Feil, LCSW

## 2018-12-30 NOTE — Progress Notes (Addendum)
[facility]    NEPHROLOGY PROGRESS NOTE  SUBJECTIVE:  Feels ok today.  900 mL UOP over 6/23.  Foley cath appears to have been discontinued 6/22.   Review of systems Denies n/v Denies shortness of breath or chest pain  OBJECTIVE:  Vitals:   12/30/18 1338 12/30/18 1735  BP: (!) 184/53 (!) 176/73  Pulse: 61 63  Resp:  19  Temp:  98.2 F (36.8 C)  SpO2:  94%    Intake/Output Summary (Last 24 hours) at 12/30/2018 1827 Last data filed at 12/30/2018 1745 Gross per 24 hour  Intake 900 ml  Output 900 ml  Net 0 ml      General:  AAOx3 NAD HEENT: MMM [place] AT anicteric sclera CV:  S1S2; no rub Lungs: clear and unlabored  Abd:  abd SNT/ND with normal BS Extremities:  No LE edema. Skin:  No skin rash GU - purewick in place   MEDICATIONS:  . amiodarone  200 mg Oral Q M,W,F  . amLODipine  10 mg Oral Daily  . cloNIDine  0.1 mg Oral BID  . feeding supplement (ENSURE ENLIVE)  237 mL Oral BID BM  . hydrALAZINE  100 mg Oral Q8H  . insulin aspart  0-5 Units Subcutaneous QHS  . insulin aspart  0-9 Units Subcutaneous TID WC  . isosorbide mononitrate  15 mg Oral Daily  . multivitamin with minerals  1 tablet Oral Daily  . pantoprazole  40 mg Oral BID  . sodium chloride flush  3 mL Intravenous Q12H  . sodium chloride flush  3 mL Intravenous Q12H       LABS:   CBC Latest Ref Rng & Units 12/30/2018 12/29/2018 12/28/2018  WBC 4.0 - 10.5 K/uL 5.8 5.2 5.8  Hemoglobin 12.0 - 15.0 g/dL 7.3(L) 7.4(L) 8.1(L)  Hematocrit 36.0 - 46.0 % 22.1(L) 22.1(L) 24.4(L)  Platelets 150 - 400 K/uL 188 177 187    CMP Latest Ref Rng & Units 12/30/2018 12/29/2018 12/28/2018  Glucose 70 - 99 mg/dL 110(H) 130(H) 134(H)  BUN 8 - 23 mg/dL 43(H) 44(H) 44(H)  Creatinine 0.44 - 1.00 mg/dL 3.53(H) 3.76(H) 3.92(H)  Sodium 135 - 145 mmol/L 137 136 138  Potassium 3.5 - 5.1 mmol/L 3.7 4.1 4.0  Chloride 98 - 111 mmol/L 108 108 109  CO2 22 - 32 mmol/L 18(L) 16(L) 16(L)  Calcium 8.9 - 10.3 mg/dL 8.6(L)  8.2(L) 8.4(L)  Total Protein 6.5 - 8.1 g/dL - - -  Total Bilirubin 0.3 - 1.2 mg/dL - - -  Alkaline Phos 38 - 126 U/L - - -  AST 15 - 41 U/L - - -  ALT 0 - 44 U/L - - -    Lab Results  Component Value Date   PTH 19 07/14/2018   CALCIUM 8.6 (L) 12/30/2018   PHOS 2.3 (L) 07/14/2018       Component Value Date/Time   COLORURINE YELLOW 12/27/2018 0118   APPEARANCEUR HAZY (A) 12/27/2018 0118   LABSPEC 1.009 12/27/2018 0118   PHURINE 5.0 12/27/2018 0118   GLUCOSEU NEGATIVE 12/27/2018 0118   HGBUR NEGATIVE 12/27/2018 0118   BILIRUBINUR NEGATIVE 12/27/2018 0118   KETONESUR NEGATIVE 12/27/2018 0118   PROTEINUR 100 (A) 12/27/2018 0118   NITRITE NEGATIVE 12/27/2018 0118   LEUKOCYTESUR NEGATIVE 12/27/2018 0118      Component Value Date/Time   PHART 7.412 07/06/2018 0850   PCO2ART 34.9 07/06/2018 0850   PO2ART 206 (H) 07/06/2018 0850   HCO3 21.9 07/06/2018 0850   ACIDBASEDEF 2.1 (  H) 07/06/2018 0850   O2SAT 99.9 07/06/2018 0850       Component Value Date/Time   IRON 37 12/28/2018 0538   TIBC 161 (L) 12/28/2018 0538   FERRITIN 566 (H) 12/28/2018 0538   IRONPCTSAT 23 12/28/2018 0538       ASSESSMENT/PLAN:    The patient is a 73 y.o. year old female with a past medical history significant for hypertension, paroxysmal atrial fibrillation on Eliquis, PE, and peptic ulcer disease presented to the [facility] emergency department on 12/18/2018 with epigastric discomfort and nausea and vomiting.  She was found to have an acute urinary tract infection and cholangitis.  She was admitted and was treated with antibiotics and underwent a laparoscopic cholecystectomy.  She developed severe anemia during that hospitalization requiring 2 units of packed red blood cells.  She had just undergone an EGD prior to that admission that was negative, and her fecal occult blood test were also negative.  Her serum creatinine had worsened to 3.7 from 2.5 on admission (1.82 in May 2020).  A renal ultrasound revealed  an atrophic right kidney.  She has gained 11 pounds since her hospitalization.  The hospitalist at [facility] requested further intervention and management by nephrology.  She was subsequently transferred to [facility] for further evaluation.  1.  Acute kidney injury 2/2 hemodynamically mediated with pre-renal and ischemic insults.  Hx significant anemia requiring packed RBCs.  She also underwent a cholecystectomy.  No hematuria and 100 mg/dL protein.   - Improving  - Check up/cr ratio - still needs to be collected.  Ok to in/out cath - Holding lasix for now    - Check bladder scan and place foley if retains over 300 mL urine  - normal saline at 75 ml/hr x 8 hours  2.  Chronic kidney disease stage III.  Her baseline serum creatinine was 1.82 in May 2020.  Secondary in part to HTN as well as decreased nephron mass with right atrophic kidney, suggesting some renal vascular disease  3.  Cholangitis status post cholecystectomy on 12/22/2018.  4.  Hypertension.  Would avoid ACE inhibition in the setting of acute kidney injury.  Clonidine was decreased due to bradycardia.  Hydralazine increased.  Discontinue lasix for now (got two doses on 6/23).  Note imdur added.  Off labetalol for bradycardia, as well.  On amlodipine.  Increased clonidine to 0.2 mg BID   5.  Anemia.  Acute blood loss.  Did receive packed red blood cell transfusion at [facility].  Eliquis is currently on hold.  No acute indication for PRBC's but would keep Hb above 7.  Feraheme x 1

## 2018-12-30 NOTE — TOC Initial Note (Signed)
Transition of Care [facility]) - Initial/Assessment Note    Patient Details  Name: Jade Mclean MRN: [id number] Date of Birth: 03-24-1946  [facility]) CM/SW Contact:    Jade Feil, LCSW Phone Number: 12/30/2018, 5:23 PM  Clinical Narrative:  On 6/23 CSW visited with patient to discuss her discharge plan. [name] was sitting up in bed and was awake, alert and agreeable to talking with CSW regarding her discharge. Although patient is noted to not be fully oriented, patient was able to appropriately engage with CSW and indicate agreement with going to a facility for rehab and also informed  CSW informed that she lives with her brother Jade Mclean. [name] informed CSW of her facility preference - Jade Mclean. When asked, patient responded that she has been to this facility before a few month ago. [name] also informed CSW that she lives across the street from the facility.                  Expected Discharge Plan: Jade Mclean     Patient Goals and CMS Choice Patient states their goals for this hospitalization and ongoing recovery are:: Get stronger with rehab and go home CMS Medicare.gov Compare Post Acute Care list provided to:: Other (Comment Required)(Patient provided CSW with facility preference) Choice offered to / list presented to : (Patient provided facility preference)  Expected Discharge Plan and Services Expected Discharge Plan: [place] arrangements for the past 2 months: Single Family Home Expected Discharge Date: 01/05/19                                    Prior Living Arrangements/Services Living arrangements for the past 2 months: Single Family Home Lives with:: Siblings Patient language and need for interpreter reviewed:: No Do you feel safe going back to the place where you live?: No(Patient agreeable with SNF for rehab)      Need for Family Participation in Patient Care: Yes  (Comment) Care giver support system in place?: Yes (comment)   Criminal Activity/Legal Involvement Pertinent to Current Situation/Hospitalization: No - Comment as needed  Activities of Daily Living   ADL Screening (condition at time of admission) Patient's cognitive ability adequate to safely complete daily activities?: No(A/O X 4  WITH SOME CONFUSION) Is the patient deaf or have difficulty hearing?: No Does the patient have difficulty seeing, even when wearing glasses/contacts?: No Does the patient have difficulty concentrating, remembering, or making decisions?: Yes Patient able to express need for assistance with ADLs?: Yes Does the patient have difficulty dressing or bathing?: Yes Independently performs ADLs?: No Communication: Independent Dressing (OT): Needs assistance Is this a change from baseline?: Change from baseline, expected to last <3days Grooming: Needs assistance Is this a change from baseline?: Change from baseline, expected to last <3 days Feeding: Independent Bathing: Needs assistance Is this a change from baseline?: Change from baseline, expected to last <3 days Toileting: Needs assistance Is this a change from baseline?: Change from baseline, expected to last <3 days In/Out Bed: Needs assistance Is this a change from baseline?: Change from baseline, expected to last <3 days Walks in Home: Needs assistance Is this a change from baseline?: Pre-admission baseline Does the patient have difficulty walking or climbing stairs?: Yes(USES WALKER) Weakness of Legs: Both Weakness of Arms/Hands: None  Permission Sought/Granted Permission sought to share information with : Other (comment)(HCPOA)  Share Information with NAME: Jade Mclean - niece and POA - [phone]. Brother Jade Mclean - [phone]     Permission granted to share info w Relationship: Niece and HCPOA and brother  Permission granted to share info w Contact Information: Niece - [phone];  Jade Mclean - [phone]  Emotional Assessment Appearance:: Appears stated age Attitude/Demeanor/Rapport: Other (comment)(Appropriate) Affect (typically observed): Accepting, Appropriate, Pleasant Orientation: : Oriented to Self, Oriented to Place Alcohol / Substance Use: Never Used Psych Involvement: No (comment)  Admission diagnosis:  RENAL FAIL Patient Active Problem List   Diagnosis Date Noted  . Acute renal failure superimposed on stage 4 chronic kidney disease ([place]) 12/26/2018  . Anemia 12/26/2018  . Ascending cholangitis 12/26/2018  . Accelerated hypertension 12/26/2018  . History of pulmonary embolism 12/26/2018  . PUD (peptic ulcer disease) 12/26/2018  . Chronic anticoagulation 11/05/2018  . Hypokalemia 10/26/2018  . PAF (paroxysmal atrial fibrillation) ([place]) 09/15/2018  . On amiodarone therapy 09/15/2018  . Pulmonary embolism ([place]) 09/15/2018  . Acute blood loss anemia   . GI bleeding 07/05/2018  . Fatigue 05/22/2018  . Obesity (BMI 30-39.9) 09/18/2017  . CKD (chronic kidney disease) stage 4, GFR 15-29 ml/min (HCC) 02/20/2016  . Osteoporosis 02/20/2016  . Hypertensive heart disease 08/16/2015  . Chronic rheumatic arthritis ([place]) 08/16/2015  . Mixed hyperlipidemia 08/16/2015  . Type 2 diabetes mellitus without complication, without long-term current use of insulin ([place]) 08/16/2015   PCP:  Jade Greenhouse, MD Pharmacy:   [facility] [place], [place] - [address] AT [address] [place] [address] Phone: [phone] Fax: [phone]     Social Determinants of Health (SDOH) Interventions  No interventions needed at this time  Readmission Risk Interventions No flowsheet data found.

## 2018-12-30 NOTE — Progress Notes (Signed)
PROGRESS NOTE    Jade Mclean  FVC:[id number] DOB: 05-09-1946 DOA: 12/26/2018 PCP: Algis Greenhouse, MD   Brief Narrative: Jade Mclean is a 73 y.o. female with a history HTN, DM, CKD baseline 1.8-2.2, pAF on Eliquis, RA on Enbrel, history of PE. Patient presented secondary to AKI.   Assessment & Plan:   Principal Problem:   Acute renal failure superimposed on stage 4 chronic kidney disease (HCC) Active Problems:   Type 2 diabetes mellitus without complication, without long-term current use of insulin (HCC)   PAF (paroxysmal atrial fibrillation) (HCC)   Anemia   Ascending cholangitis   Accelerated hypertension   History of pulmonary embolism   PUD (peptic ulcer disease)   Acute kidney injury on CKD stage IV Non-anion gap metabolic acidosis Baseline creatinine of around 2.4 with peak of 4.09 currently. Nephrology on board. UOP of 900 ml over the last 24 hours. Weight was 190 lbs on admission. Currently 191 lbs. Renal ultrasound significant for kidneys left larger than right.  Paroxysmal atrial fibrillation Stable. On Amiodarone and Eliquis as an outpatient. Eliquis held secondary to anemia. -Continue amiodarone  Anemia Unknown etiology. Per chart review, patient has a history of normal EGD and negative FOBT at OSH. FOBT ordered here and is pending. Possibly related to recent cholecystecomy. -Daily CBC  History of cholangitis Patient is s/p cholecystectomy and antibiotics. Recently managed.  Essential hypertension On amlodipine, clonidine, hydralazine, Imdur, labetalol as an outpatient. Difficult to control. Possibly related to RAS although no formal diagnosis. -Continue amlodipine, clonidine, hydralazine, labetalol -Start hydralazine IV prn  Diabetes mellitus, type 2 On Tresiba -SSI  Rheumatoid arthritis Patient on Enbrel which has been held secondary to recent infection. Follow-up outpatient.  Hypomagnesemia Given supplementation  Pressure injury Left buttock,  POA    DVT prophylaxis: SCDs Code Status:   Code Status: Full Code Family Communication: None Disposition Plan: Discharge pending improvement of AKI   Consultants:   Nephrology  Procedures:   None  Antimicrobials:  None    Subjective: Some abdominal pain. Nausea. No other issues.  Objective: Vitals:   12/29/18 2128 12/30/18 0409 12/30/18 1008 12/30/18 1008  BP: (!) 168/40 (!) 175/40 (!) 196/57 (!) 196/57  Pulse: (!) 58 (!) 57  61  Resp: 20 20  20   Temp: 98.2 F (36.8 C) 98.6 F (37 C)  98.1 F (36.7 C)  TempSrc: Oral Oral  Oral  SpO2: 95% 96%  95%  Weight: 87 kg     Height:        Intake/Output Summary (Last 24 hours) at 12/30/2018 1236 Last data filed at 12/30/2018 0834 Gross per 24 hour  Intake 480 ml  Output 900 ml  Net -420 ml   Filed Weights   12/28/18 2013 12/29/18 0500 12/29/18 2128  Weight: 87.5 kg 87.5 kg 87 kg    Examination:  General exam: Appears calm and comfortable Respiratory system: Clear to auscultation. Respiratory effort normal. Cardiovascular system: S1 & S2 heard, RRR. No murmurs, rubs, gallops or clicks. Gastrointestinal system: Abdomen is nondistended, soft and nontender. No organomegaly or masses felt. Normal bowel sounds heard. Central nervous system: Alert and oriented. No focal neurological deficits. Extremities: No edema. No calf tenderness Skin: No cyanosis. No rashes Psychiatry: Judgement and insight appear normal. Mood & affect appropriate.     Data Reviewed: I have personally reviewed following labs and imaging studies  CBC: Recent Labs  Lab 12/27/18 0038 12/28/18 0538 12/29/18 0542 12/30/18 0658  WBC 5.2 5.8 5.2 5.8  NEUTROABS  3.9  --   --   --   HGB 7.7* 8.1* 7.4* 7.3*  HCT 22.9* 24.4*  24.7* 22.1* 22.1*  MCV 87.1 87.8 88.4 87.7  PLT 167 187 177 622   Basic Metabolic Panel: Recent Labs  Lab 12/27/18 0038 12/28/18 0538 12/29/18 0542 12/30/18 0658  NA 137 138 136 137  K 3.9 4.0 4.1 3.7  CL 110  109 108 108  CO2 17* 16* 16* 18*  GLUCOSE 139* 134* 130* 110*  BUN 41* 44* 44* 43*  CREATININE 4.09* 3.92* 3.76* 3.53*  CALCIUM 8.2* 8.4* 8.2* 8.6*  MG 1.6*  --   --   --    GFR: Estimated Creatinine Clearance: 16.2 mL/min (A) (by C-G formula based on SCr of 3.53 mg/dL (H)). Liver Function Tests: Recent Labs  Lab 12/27/18 0038  AST 31  ALT 25  ALKPHOS 48  BILITOT 0.8  PROT 5.4*  ALBUMIN 2.3*   BNP (last 3 results) Recent Labs    10/23/18 1020  PROBNP 1,732*   HbA1C: No results for input(s): HGBA1C in the last 72 hours. CBG: Recent Labs  Lab 12/29/18 1132 12/29/18 1644 12/29/18 2129 12/30/18 0657 12/30/18 1118  GLUCAP 117* 119* 106* 108* 114*   Lipid Profile: No results for input(s): CHOL, HDL, LDLCALC, TRIG, CHOLHDL, LDLDIRECT in the last 72 hours. Thyroid Function Tests: No results for input(s): TSH, T4TOTAL, FREET4, T3FREE, THYROIDAB in the last 72 hours. Anemia Panel: Recent Labs    12/28/18 0538  VITAMINB12 821  FERRITIN 566*  TIBC 161*  IRON 37   Sepsis Labs: No results for input(s): PROCALCITON, LATICACIDVEN in the last 168 hours.  No results found for this or any previous visit (from the past 240 hour(s)).       Radiology Studies: No results found.      Scheduled Meds: . amiodarone  200 mg Oral Q M,W,F  . amLODipine  10 mg Oral Daily  . cloNIDine  0.1 mg Oral BID  . feeding supplement (ENSURE ENLIVE)  237 mL Oral BID BM  . hydrALAZINE  100 mg Oral Q8H  . insulin aspart  0-5 Units Subcutaneous QHS  . insulin aspart  0-9 Units Subcutaneous TID WC  . isosorbide mononitrate  15 mg Oral Daily  . multivitamin with minerals  1 tablet Oral Daily  . pantoprazole  40 mg Oral BID  . sodium chloride flush  3 mL Intravenous Q12H  . sodium chloride flush  3 mL Intravenous Q12H   Continuous Infusions: . sodium chloride       LOS: 4 days     Cordelia Poche, MD [facility] 12/30/2018, 12:36 PM  If 7PM-7AM, please contact  night-coverage [email or web address]

## 2018-12-30 NOTE — Progress Notes (Signed)
Patients update given to the niece Gordy Councilman and answered all her questions.

## 2018-12-31 LAB — CBC
HCT: 22.9 % — ABNORMAL LOW (ref 36.0–46.0)
Hemoglobin: 7.5 g/dL — ABNORMAL LOW (ref 12.0–15.0)
MCH: 28.6 pg (ref 26.0–34.0)
MCHC: 32.8 g/dL (ref 30.0–36.0)
MCV: 87.4 fL (ref 80.0–100.0)
Platelets: 196 10*3/uL (ref 150–400)
RBC: 2.62 MIL/uL — ABNORMAL LOW (ref 3.87–5.11)
RDW: 16 % — ABNORMAL HIGH (ref 11.5–15.5)
WBC: 6.6 10*3/uL (ref 4.0–10.5)
nRBC: 0.3 % — ABNORMAL HIGH (ref 0.0–0.2)

## 2018-12-31 LAB — BASIC METABOLIC PANEL
Anion gap: 12 (ref 5–15)
BUN: 42 mg/dL — ABNORMAL HIGH (ref 8–23)
CO2: 17 mmol/L — ABNORMAL LOW (ref 22–32)
Calcium: 8.3 mg/dL — ABNORMAL LOW (ref 8.9–10.3)
Chloride: 107 mmol/L (ref 98–111)
Creatinine, Ser: 3.23 mg/dL — ABNORMAL HIGH (ref 0.44–1.00)
GFR calc Af Amer: 16 mL/min — ABNORMAL LOW (ref >60)
GFR calc non Af Amer: 14 mL/min — ABNORMAL LOW (ref >60)
Glucose, Bld: 135 mg/dL — ABNORMAL HIGH (ref 70–99)
Potassium: 3.7 mmol/L (ref 3.5–5.1)
Sodium: 136 mmol/L (ref 135–145)

## 2018-12-31 LAB — GLUCOSE, CAPILLARY
Glucose-Capillary: 125 mg/dL — ABNORMAL HIGH (ref 70–99)
Glucose-Capillary: 107 mg/dL — ABNORMAL HIGH (ref 70–99)
Glucose-Capillary: 87 mg/dL (ref 70–99)
Glucose-Capillary: 104 mg/dL — ABNORMAL HIGH (ref 70–99)

## 2018-12-31 LAB — NOVEL CORONAVIRUS, NAA (HOSP ORDER, SEND-OUT TO REF LAB; TAT 18-24 HRS): SARS-CoV-2, NAA: NOT DETECTED

## 2018-12-31 LAB — PROTEIN / CREATININE RATIO, URINE
Creatinine, Urine: 119.77 mg/dL
Protein Creatinine Ratio: 2.97 mg/mg{Cre} — ABNORMAL HIGH (ref 0.00–0.15)
Total Protein, Urine: 356 mg/dL

## 2018-12-31 MED ORDER — ISOSORBIDE MONONITRATE ER 30 MG PO TB24
15.0000 mg | ORAL_TABLET | Freq: Once | ORAL | Status: AC
  Administered 2018-12-31: 15 mg via ORAL
  Filled 2018-12-31: qty 1

## 2018-12-31 MED ORDER — ISOSORBIDE MONONITRATE ER 30 MG PO TB24
30.0000 mg | ORAL_TABLET | Freq: Every day | ORAL | Status: DC
  Administered 2019-01-01 – 2019-01-06 (×6): 30 mg via ORAL
  Filled 2018-12-31 (×7): qty 1

## 2018-12-31 MED ORDER — LABETALOL HCL 100 MG PO TABS
100.0000 mg | ORAL_TABLET | Freq: Two times a day (BID) | ORAL | Status: DC
  Administered 2018-12-31 – 2019-01-01 (×2): 100 mg via ORAL
  Filled 2018-12-31 (×3): qty 1

## 2018-12-31 MED ORDER — SODIUM CHLORIDE 0.9 % IV SOLN
INTRAVENOUS | Status: AC
  Administered 2018-12-31: 13:00:00 via INTRAVENOUS

## 2018-12-31 MED ORDER — SODIUM BICARBONATE 650 MG PO TABS
650.0000 mg | ORAL_TABLET | Freq: Two times a day (BID) | ORAL | Status: DC
  Administered 2018-12-31 – 2019-01-01 (×2): 650 mg via ORAL
  Filled 2018-12-31 (×3): qty 1

## 2018-12-31 NOTE — Progress Notes (Signed)
Physical Therapy Treatment Patient Details Name: Jade Mclean MRN: [id number] DOB: 1946-02-26 Today's Date: 12/31/2018    History of Present Illness Jade Mclean is a 73 y.o. female with PMH of hypertension, paroxysmal atrial fibrillation on Eliquis,  PE, peptic ulcer disease, presenting from  [facility] ED while transferred to [facility] for management of acute kidney injury.     PT Comments    Patient sleeping upon entry, agreeable to bed level therex but declining adamantly OOB mobility. Educated on benefits of further mobility, pt with low motivation this visit. cont to rec SNF.    Follow Up Recommendations  SNF     Equipment Recommendations       Recommendations for Other Services       Precautions / Restrictions Precautions Precautions: Fall Restrictions Weight Bearing Restrictions: No    Mobility  Bed Mobility Overal bed mobility: Needs Assistance Bed Mobility: Supine to Sit     Supine to sit: Mod assist     General bed mobility comments: bed pad used to adjust sitting at EOB. required assistance to elevate trunk due to BUE weakness.  Transfers                 General transfer comment: declined  Ambulation/Gait                 Stairs             Wheelchair Mobility    Modified Rankin (Stroke Patients Only)       Balance Overall balance assessment: Needs assistance Sitting-balance support: Bilateral upper extremity supported;Feet supported Sitting balance-Leahy Scale: Poor                                      Cognition Arousal/Alertness: Awake/alert Behavior During Therapy: WFL for tasks assessed/performed Overall Cognitive Status: Impaired/Different from baseline Area of Impairment: Orientation;Memory;Safety/judgement                 Orientation Level: Disoriented to;Place;Situation;Time(reports being in hospital for surgery.)   Memory: Decreased short-term memory   Safety/Judgement:  Decreased awareness of safety;Decreased awareness of deficits     General Comments: Pt pleasantly confused required cues and reminders for engagement.       Exercises General Exercises - Lower Extremity Ankle Circles/Pumps: 20 reps Quad Sets: 20 reps Heel Slides: 20 reps Hip ABduction/ADduction: 20 reps Straight Leg Raises: 20 reps    General Comments        Pertinent Vitals/Pain Faces Pain Scale: Hurts little more Pain Location: BLE Pain Descriptors / Indicators: Aching    Home Living                      Prior Function            PT Goals (current goals can now be found in the care plan section) Acute Rehab PT Goals Patient Stated Goal: no goal stated PT Goal Formulation: With patient Potential to Achieve Goals: Fair Progress towards PT goals: Progressing toward goals    Frequency    Min 2X/week      PT Plan      Co-evaluation              AM-PAC PT "6 Clicks" Mobility   Outcome Measure  Help needed turning from your back to your side while in a flat bed without using bedrails?: A Lot Help needed moving from lying  on your back to sitting on the side of a flat bed without using bedrails?: A Lot Help needed moving to and from a bed to a chair (including a wheelchair)?: A Lot Help needed standing up from a chair using your arms (e.g., wheelchair or bedside chair)?: A Lot Help needed to walk in hospital room?: Total Help needed climbing 3-5 steps with a railing? : Total 6 Click Score: 10    End of Session   Activity Tolerance: Patient tolerated treatment well Patient left: in bed;with call bell/phone within reach Nurse Communication: Mobility status PT Visit Diagnosis: Unsteadiness on feet (R26.81)     Time: 8280-0349 PT Time Calculation (min) (ACUTE ONLY): 15 min  Charges:  $Therapeutic Exercise: 8-22 mins                     Reinaldo Berber, PT, DPT Acute Rehabilitation Services Pager: [phone] Office:  [place] 12/31/2018, 12:07 PM

## 2018-12-31 NOTE — Progress Notes (Signed)
On call provider made aware. RN will continue to monitor pt.     12/31/18 0538  Vitals  Temp 98.7 F (37.1 C)  Temp Source Oral  BP (!) 205/48  MAP (mmHg) 92  BP Location Left Arm

## 2018-12-31 NOTE — Progress Notes (Signed)
[facility]    NEPHROLOGY PROGRESS NOTE  SUBJECTIVE:  Feels ok today.  Strict ins/outs are not available.  Her urine protein/cr ratio still hasn't been collected.   Bladder scan was 30 mL.  States that her blood pressure has been uncontrolled all of her life.  She is not sure of her home medications - I called her niece and did not get an answer.   Review of systems  Denies n/v Denies shortness of breath or chest pain  OBJECTIVE:  Vitals:   12/31/18 0538 12/31/18 0922  BP: (!) 205/48 (!) 188/48  Pulse: 63 60  Resp: (!) 24 20  Temp: 98.7 F (37.1 C) 98.2 F (36.8 C)  SpO2: 97% 99%    Intake/Output Summary (Last 24 hours) at 12/31/2018 1245 Last data filed at 12/31/2018 0848 Gross per 24 hour  Intake 766.48 ml  Output 600 ml  Net 166.48 ml      General:  AAOx3 NAD HEENT: MMM [place] AT anicteric sclera CV:  S1S2; no rub Lungs: clear and unlabored  Abd:  abd SNT/ND with normal BS Extremities:  No LE edema. Skin:  No skin rash GU - purewick in place   MEDICATIONS:  . amiodarone  200 mg Oral Q M,W,F  . amLODipine  10 mg Oral Daily  . cloNIDine  0.2 mg Oral BID  . feeding supplement (ENSURE ENLIVE)  237 mL Oral BID BM  . hydrALAZINE  100 mg Oral Q8H  . insulin aspart  0-5 Units Subcutaneous QHS  . insulin aspart  0-9 Units Subcutaneous TID WC  . isosorbide mononitrate  15 mg Oral Daily  . multivitamin with minerals  1 tablet Oral Daily  . pantoprazole  40 mg Oral BID  . sodium chloride flush  3 mL Intravenous Q12H  . sodium chloride flush  3 mL Intravenous Q12H       LABS:   CBC Latest Ref Rng & Units 12/31/2018 12/30/2018 12/29/2018  WBC 4.0 - 10.5 K/uL 6.6 5.8 5.2  Hemoglobin 12.0 - 15.0 g/dL 7.5(L) 7.3(L) 7.4(L)  Hematocrit 36.0 - 46.0 % 22.9(L) 22.1(L) 22.1(L)  Platelets 150 - 400 K/uL 196 188 177    CMP Latest Ref Rng & Units 12/31/2018 12/30/2018 12/29/2018  Glucose 70 - 99 mg/dL 135(H) 110(H) 130(H)  BUN 8 - 23 mg/dL 42(H) 43(H) 44(H)   Creatinine 0.44 - 1.00 mg/dL 3.23(H) 3.53(H) 3.76(H)  Sodium 135 - 145 mmol/L 136 137 136  Potassium 3.5 - 5.1 mmol/L 3.7 3.7 4.1  Chloride 98 - 111 mmol/L 107 108 108  CO2 22 - 32 mmol/L 17(L) 18(L) 16(L)  Calcium 8.9 - 10.3 mg/dL 8.3(L) 8.6(L) 8.2(L)  Total Protein 6.5 - 8.1 g/dL - - -  Total Bilirubin 0.3 - 1.2 mg/dL - - -  Alkaline Phos 38 - 126 U/L - - -  AST 15 - 41 U/L - - -  ALT 0 - 44 U/L - - -    Lab Results  Component Value Date   PTH 19 07/14/2018   CALCIUM 8.3 (L) 12/31/2018   PHOS 2.3 (L) 07/14/2018       Component Value Date/Time   COLORURINE YELLOW 12/27/2018 0118   APPEARANCEUR HAZY (A) 12/27/2018 0118   LABSPEC 1.009 12/27/2018 0118   PHURINE 5.0 12/27/2018 0118   GLUCOSEU NEGATIVE 12/27/2018 0118   HGBUR NEGATIVE 12/27/2018 0118   BILIRUBINUR NEGATIVE 12/27/2018 0118   KETONESUR NEGATIVE 12/27/2018 0118   PROTEINUR 100 (A) 12/27/2018 0118   NITRITE NEGATIVE  12/27/2018 0118   LEUKOCYTESUR NEGATIVE 12/27/2018 0118      Component Value Date/Time   PHART 7.412 07/06/2018 0850   PCO2ART 34.9 07/06/2018 0850   PO2ART 206 (H) 07/06/2018 0850   HCO3 21.9 07/06/2018 0850   ACIDBASEDEF 2.1 (H) 07/06/2018 0850   O2SAT 99.9 07/06/2018 0850       Component Value Date/Time   IRON 37 12/28/2018 0538   TIBC 161 (L) 12/28/2018 0538   FERRITIN 566 (H) 12/28/2018 0538   IRONPCTSAT 23 12/28/2018 0538       ASSESSMENT/PLAN:    The patient is a 73 y.o. year old female with a past medical history significant for hypertension, paroxysmal atrial fibrillation on Eliquis, PE, and peptic ulcer disease presented to the [facility] emergency department on 12/18/2018 with epigastric discomfort and nausea and vomiting.  She was found to have an acute urinary tract infection and cholangitis.  She was admitted and was treated with antibiotics and underwent a laparoscopic cholecystectomy.  She developed severe anemia during that hospitalization requiring 2 units of packed red blood  cells.  She had just undergone an EGD prior to that admission that was negative, and her fecal occult blood test were also negative.  Her serum creatinine had worsened to 3.7 from 2.5 on admission (1.82 in May 2020).  A renal ultrasound revealed an atrophic right kidney.  She has gained 11 pounds since her hospitalization.  The hospitalist at [facility] requested further intervention and management by nephrology.  She was subsequently transferred to [facility] for further evaluation.  1.  Acute kidney injury 2/2 hemodynamically mediated with pre-renal and ischemic insults.  Hx significant anemia requiring packed RBCs.  She also underwent a cholecystectomy.  No hematuria and 100 mg/dL protein.   - Slowly Improving and no acute indication for HD - Check up/cr ratio - still needs to be collected.  Spoke with nursing.  - normal saline at 75 ml/hr x 12 hours  2.  Chronic kidney disease stage III.  Her baseline serum creatinine was 1.82 in May 2020.  Secondary in part to HTN as well as decreased nephron mass with right atrophic kidney, suggesting some renal vascular disease  3.  Hypertension.  Would avoid ACE inhibition in the setting of acute kidney injury.  Clonidine was initially decreased due to bradycardia but now back at 0.2 mg BID.  Hydralazine increased. Low dose Imdur was added - will increase.  On amlodipine.  Added back labetalol at reduced dose of 100 mg BID.  Will obtain a renal artery duplex to assess for renal artery stenosis.  Fluid and sodium bicarb working against her pressure a bit but for AKI and acidosis   4.  Cholangitis status post cholecystectomy on 12/22/2018.  5.  Anemia.  Acute blood loss.  Did receive packed red blood cell transfusion at [facility].  Eliquis is currently on hold.  No acute indication for PRBC's but would keep Hb above 7.  Feraheme x 1.  Defer aranesp today with uncontrolled HTN    6. Metabolic acidosis - no abd pain.  Setting of AKI.  Start sodium bicarb 650 mg  BID

## 2018-12-31 NOTE — Progress Notes (Signed)
Pt c/o sob. O2 wnl on RA. On call provider made aware. RN will continue to monitor.

## 2018-12-31 NOTE — Plan of Care (Signed)
  Problem: Activity: Goal: Risk for activity intolerance will decrease Outcome: Progressing   

## 2018-12-31 NOTE — Progress Notes (Signed)
PROGRESS NOTE    Jade Mclean  XHB:[id number] DOB: 09-20-1945 DOA: 12/26/2018 PCP: Algis Greenhouse, MD   Brief Narrative: Jade Mclean is a 73 y.o. female with a history HTN, DM, CKD baseline 1.8-2.2, pAF on Eliquis, RA on Enbrel, history of PE. Patient presented secondary to AKI.   Assessment & Plan:   Principal Problem:   Acute renal failure superimposed on stage 4 chronic kidney disease (HCC) Active Problems:   Type 2 diabetes mellitus without complication, without long-term current use of insulin (HCC)   PAF (paroxysmal atrial fibrillation) (HCC)   Anemia   Ascending cholangitis   Accelerated hypertension   History of pulmonary embolism   PUD (peptic ulcer disease)   Acute kidney injury on CKD stage IV Non-anion gap metabolic acidosis Baseline creatinine of around 2.4 with peak of 4.09 currently. Nephrology on board. UOP of 900 ml over the last 24 hours. Weight was 190 lbs on admission. Currently 191 lbs. Renal ultrasound significant for kidneys left larger than right. -Management per nephrology  Paroxysmal atrial fibrillation Stable. On Amiodarone and Eliquis as an outpatient. Eliquis held secondary to anemia. -Continue amiodarone  Anemia Unknown etiology but possible secondary to chronic kidney disease. Per chart review, patient has a history of normal EGD and negative FOBT at OSH. FOBT ordered here and is pending. Possibly related to recent cholecystecomy. Stable. -Daily CBC  History of cholangitis Patient is s/p cholecystectomy and antibiotics. Recently managed.  Essential hypertension On amlodipine, clonidine, hydralazine, Imdur, labetalol as an outpatient. Difficult to control. Possibly related to RAS although no formal diagnosis. -Continue amlodipine, clonidine (increased back to 0.2 mg BID), hydralazine -Continue hydralazine IV prn  Diabetes mellitus, type 2 On Tresiba -SSI  Rheumatoid arthritis Patient on Enbrel which has been held secondary to recent  infection. Follow-up outpatient.  Hypomagnesemia Given supplementation  Pressure injury Left buttock, POA    DVT prophylaxis: SCDs Code Status:   Code Status: Full Code Family Communication: None Disposition Plan: Discharge to SNF pending improvement of AKI   Consultants:   Nephrology  Procedures:   None  Antimicrobials:  None    Subjective: Abdominal pain improved. Decreased appetite. No other concerns  Objective: Vitals:   12/31/18 0005 12/31/18 0043 12/31/18 0046 12/31/18 0538  BP:  (!) 199/48 (!) 185/41 (!) 205/48  Pulse:  61 (!) 54 63  Resp: 20   (!) 24  Temp:    98.7 F (37.1 C)  TempSrc:    Oral  SpO2:    97%  Weight:   85.1 kg   Height:        Intake/Output Summary (Last 24 hours) at 12/31/2018 0922 Last data filed at 12/31/2018 0848 Gross per 24 hour  Intake 886.48 ml  Output 600 ml  Net 286.48 ml   Filed Weights   12/29/18 0500 12/29/18 2128 12/31/18 0046  Weight: 87.5 kg 87 kg 85.1 kg    Examination:  General exam: Appears calm and comfortable Respiratory system: Clear to auscultation. Respiratory effort normal. Cardiovascular system: S1 & S2 heard, RRR. Systolic murmur Gastrointestinal system: Abdomen is nondistended, soft and nontender. No organomegaly or masses felt. Normal bowel sounds heard. Central nervous system: Alert and oriented. No focal neurological deficits. Extremities: No edema. No calf tenderness Skin: No cyanosis. Hypopigmented patches noted on face and chest Psychiatry: Judgement and insight appear normal. Mood & affect appropriate.      Data Reviewed: I have personally reviewed following labs and imaging studies  CBC: Recent Labs  Lab 12/27/18 0038  12/28/18 0538 12/29/18 0542 12/30/18 0658 12/31/18 0518  WBC 5.2 5.8 5.2 5.8 6.6  NEUTROABS 3.9  --   --   --   --   HGB 7.7* 8.1* 7.4* 7.3* 7.5*  HCT 22.9* 24.4*  24.7* 22.1* 22.1* 22.9*  MCV 87.1 87.8 88.4 87.7 87.4  PLT 167 187 177 188 400   Basic  Metabolic Panel: Recent Labs  Lab 12/27/18 0038 12/28/18 0538 12/29/18 0542 12/30/18 0658 12/31/18 0518  NA 137 138 136 137 136  K 3.9 4.0 4.1 3.7 3.7  CL 110 109 108 108 107  CO2 17* 16* 16* 18* 17*  GLUCOSE 139* 134* 130* 110* 135*  BUN 41* 44* 44* 43* 42*  CREATININE 4.09* 3.92* 3.76* 3.53* 3.23*  CALCIUM 8.2* 8.4* 8.2* 8.6* 8.3*  MG 1.6*  --   --   --   --    GFR: Estimated Creatinine Clearance: 17.6 mL/min (A) (by C-G formula based on SCr of 3.23 mg/dL (H)). Liver Function Tests: Recent Labs  Lab 12/27/18 0038  AST 31  ALT 25  ALKPHOS 48  BILITOT 0.8  PROT 5.4*  ALBUMIN 2.3*   BNP (last 3 results) Recent Labs    10/23/18 1020  PROBNP 1,732*   HbA1C: No results for input(s): HGBA1C in the last 72 hours. CBG: Recent Labs  Lab 12/30/18 0657 12/30/18 1118 12/30/18 1627 12/30/18 2218 12/31/18 0651  GLUCAP 108* 114* 112* 113* 125*   Lipid Profile: No results for input(s): CHOL, HDL, LDLCALC, TRIG, CHOLHDL, LDLDIRECT in the last 72 hours. Thyroid Function Tests: No results for input(s): TSH, T4TOTAL, FREET4, T3FREE, THYROIDAB in the last 72 hours. Anemia Panel: No results for input(s): VITAMINB12, FOLATE, FERRITIN, TIBC, IRON, RETICCTPCT in the last 72 hours. Sepsis Labs: No results for input(s): PROCALCITON, LATICACIDVEN in the last 168 hours.  Recent Results (from the past 240 hour(s))  Novel Coronavirus,NAA,(SEND-OUT TO REF LAB - TAT 24-48 hrs); Hosp Order     Status: None   Collection Time: 12/27/18 12:05 AM   Specimen: Nasopharyngeal Swab; Respiratory  Result Value Ref Range Status   SARS-CoV-2, NAA NOT DETECTED NOT DETECTED Final    Comment: (NOTE) This test was developed and its performance characteristics determined by [employer]. This test has not been FDA cleared or approved. This test has been authorized by FDA under an Emergency Use Authorization (EUA). This test is only authorized for the duration of time the declaration that  circumstances exist justifying the authorization of the emergency use of in vitro diagnostic tests for detection of SARS-CoV-2 virus and/or diagnosis of COVID-19 infection under section 564(b)(1) of the Act, 21 U.S.C. 867YPP-5(K)(9), unless the authorization is terminated or revoked sooner. When diagnostic testing is negative, the possibility of a false negative result should be considered in the context of a patient's recent exposures and the presence of clinical signs and symptoms consistent with COVID-19. An individual without symptoms of COVID-19 and who is not shedding SARS-CoV-2 virus would expect to have a negative (not detected) result in this assay. Performed  At: [facility] [address] [place], [place] [id number] Rush Farmer MD KD:[id number]    [place]  Final    Comment: Performed at [facility], [place] [address] [place], [place] [address]         Radiology Studies: No results found.      Scheduled Meds: . amiodarone  200 mg Oral Q M,W,F  . amLODipine  10 mg Oral Daily  .  cloNIDine  0.2 mg Oral BID  . feeding supplement (ENSURE ENLIVE)  237 mL Oral BID BM  . hydrALAZINE  100 mg Oral Q8H  . insulin aspart  0-5 Units Subcutaneous QHS  . insulin aspart  0-9 Units Subcutaneous TID WC  . isosorbide mononitrate  15 mg Oral Daily  . multivitamin with minerals  1 tablet Oral Daily  . pantoprazole  40 mg Oral BID  . sodium chloride flush  3 mL Intravenous Q12H  . sodium chloride flush  3 mL Intravenous Q12H   Continuous Infusions: . sodium chloride       LOS: 5 days     Cordelia Poche, MD [facility] 12/31/2018, 9:22 AM  If 7PM-7AM, please contact night-coverage [email or web address]

## 2019-01-01 ENCOUNTER — Inpatient Hospital Stay (HOSPITAL_COMMUNITY): Payer: Medicare Other

## 2019-01-01 DIAGNOSIS — I160 Hypertensive urgency: Secondary | ICD-10-CM

## 2019-01-01 LAB — CBC
HCT: 21.7 % — ABNORMAL LOW (ref 36.0–46.0)
Hemoglobin: 7 g/dL — ABNORMAL LOW (ref 12.0–15.0)
MCH: 28.7 pg (ref 26.0–34.0)
MCHC: 32.3 g/dL (ref 30.0–36.0)
MCV: 88.9 fL (ref 80.0–100.0)
Platelets: 195 10*3/uL (ref 150–400)
RBC: 2.44 MIL/uL — ABNORMAL LOW (ref 3.87–5.11)
RDW: 16.1 % — ABNORMAL HIGH (ref 11.5–15.5)
WBC: 7 10*3/uL (ref 4.0–10.5)
nRBC: 0 % (ref 0.0–0.2)

## 2019-01-01 LAB — BASIC METABOLIC PANEL
Anion gap: 11 (ref 5–15)
BUN: 41 mg/dL — ABNORMAL HIGH (ref 8–23)
CO2: 17 mmol/L — ABNORMAL LOW (ref 22–32)
Calcium: 8.3 mg/dL — ABNORMAL LOW (ref 8.9–10.3)
Chloride: 110 mmol/L (ref 98–111)
Creatinine, Ser: 3.13 mg/dL — ABNORMAL HIGH (ref 0.44–1.00)
GFR calc Af Amer: 17 mL/min — ABNORMAL LOW (ref >60)
GFR calc non Af Amer: 14 mL/min — ABNORMAL LOW (ref >60)
Glucose, Bld: 104 mg/dL — ABNORMAL HIGH (ref 70–99)
Potassium: 4 mmol/L (ref 3.5–5.1)
Sodium: 138 mmol/L (ref 135–145)

## 2019-01-01 LAB — PREPARE RBC (CROSSMATCH)

## 2019-01-01 LAB — GLUCOSE, CAPILLARY
Glucose-Capillary: 112 mg/dL — ABNORMAL HIGH (ref 70–99)
Glucose-Capillary: 118 mg/dL — ABNORMAL HIGH (ref 70–99)
Glucose-Capillary: 127 mg/dL — ABNORMAL HIGH (ref 70–99)
Glucose-Capillary: 123 mg/dL — ABNORMAL HIGH (ref 70–99)

## 2019-01-01 MED ORDER — HYDRALAZINE HCL 20 MG/ML IJ SOLN
10.0000 mg | Freq: Four times a day (QID) | INTRAMUSCULAR | Status: DC | PRN
  Administered 2019-01-01 – 2019-01-03 (×2): 10 mg via INTRAVENOUS
  Filled 2019-01-01 (×2): qty 1

## 2019-01-01 MED ORDER — SODIUM BICARBONATE 650 MG PO TABS
1300.0000 mg | ORAL_TABLET | Freq: Two times a day (BID) | ORAL | Status: DC
  Administered 2019-01-01 – 2019-01-06 (×10): 1300 mg via ORAL
  Filled 2019-01-01 (×11): qty 2

## 2019-01-01 MED ORDER — SODIUM CHLORIDE 0.9% IV SOLUTION: Freq: Once | INTRAVENOUS | Status: DC

## 2019-01-01 NOTE — Clinical Social Work Note (Signed)
CSW talked with patient regarding her discharge disposition. Had been informed during morning progression meeting that patient now wants to discharge home. CSW visited with patient at the bedside and she was lying in bed and was awake and alert. Ms. Jurado confirmed that she does not want to discharge to a nursing facility but wants to go home and requested home health. Patient added that she wants someone at home with her and this was discussed. Patient informed that over and above what home health provided, an aide with her for however long she requested would be private pay and patient expressed understanding. When asked, patient reported that she has had home health before but could not remember the name of the agency. CSW informed by patient that she has a housekeeper that comes three times per month. Ms. Mccreadie also gave CSW permission to contact her niece and POA. [employer].   Call made to Ms. Staley and she expressed awareness of her aunt wanting to discharge home and home health and aide services requested by patient . Ms. Staley expressed concern regarding her aunt returning home instead of going to a facility for ST rehab, but understands her aunts desire to be at home. Niece added that she does live very close to her aunt and uncle. Ms. [name] reported that her aunt received [place] services previously from [facility]. Ms. [name] added that she has received [facility] services through [facility]. She was informed of how to get the [facility] list through [email or web address]. CSW will continue to follow, provide SW intervention services as needed and assist with discharge home when medically stable,  Lyndsay Talamante Givens, MSW, [facility] Licensed Clinical Social Worker [place] [phone]

## 2019-01-01 NOTE — Progress Notes (Signed)
[facility]    NEPHROLOGY PROGRESS NOTE  SUBJECTIVE:  Tells me she feels better now but refused her meds (clondine and hydralazine) last night because she was tired.  Refused oral hydralazine this morning.  Just took AM meds at 10.  States that her BP has been 818 systolic at home for years.  She agrees to getting a unit of blood - we discussed risks/benefits and indications.  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  Has a purewick and had 250 mL UOP charted for 6/25. States she feels she went more than this.  Review of systems  Denies n/v Denies shortness of breath or chest pain No rash   OBJECTIVE:  Vitals:   01/01/19 0457 01/01/19 0844  BP: (!) 187/45 (!) 193/45  Pulse: (!) 57 (!) 51  Resp: 18 18  Temp: 99 F (37.2 C) 98.5 F (36.9 C)  SpO2: 98% 99%    Intake/Output Summary (Last 24 hours) at 01/01/2019 1209 Last data filed at 01/01/2019 0820 Gross per 24 hour  Intake 1092.5 ml  Output 150 ml  Net 942.5 ml      General:  AAOx3 NAD  HEENT: MMM [place] AT anicteric sclera CV:  S1S2; no rub Lungs: clear and unlabored  Abd:  abd SNT/ND with normal BS Extremities:  No LE edema. Skin:  No skin rash GU - purewick in place   MEDICATIONS:  . amiodarone  200 mg Oral Q M,W,F  . amLODipine  10 mg Oral Daily  . cloNIDine  0.2 mg Oral BID  . feeding supplement (ENSURE ENLIVE)  237 mL Oral BID BM  . hydrALAZINE  100 mg Oral Q8H  . insulin aspart  0-5 Units Subcutaneous QHS  . insulin aspart  0-9 Units Subcutaneous TID WC  . isosorbide mononitrate  30 mg Oral Daily  . labetalol  100 mg Oral BID  . multivitamin with minerals  1 tablet Oral Daily  . pantoprazole  40 mg Oral BID  . sodium bicarbonate  650 mg Oral BID  . sodium chloride flush  3 mL Intravenous Q12H  . sodium chloride flush  3  mL Intravenous Q12H       LABS:   CBC Latest Ref Rng & Units 01/01/2019 12/31/2018 12/30/2018  WBC 4.0 - 10.5 K/uL 7.0 6.6 5.8  Hemoglobin 12.0 - 15.0 g/dL 7.0(L) 7.5(L) 7.3(L)  Hematocrit 36.0 - 46.0 % 21.7(L) 22.9(L) 22.1(L)  Platelets 150 - 400 K/uL 195 196 188    CMP Latest Ref Rng & Units 01/01/2019 12/31/2018 12/30/2018  Glucose 70 - 99 mg/dL 104(H) 135(H) 110(H)  BUN 8 - 23 mg/dL 41(H) 42(H) 43(H)  Creatinine 0.44 - 1.00 mg/dL 3.13(H) 3.23(H) 3.53(H)  Sodium 135 - 145 mmol/L 138 136 137  Potassium 3.5 - 5.1 mmol/L 4.0 3.7 3.7  Chloride 98 - 111 mmol/L 110 107 108  CO2 22 - 32 mmol/L 17(L) 17(L) 18(L)  Calcium 8.9 - 10.3 mg/dL 8.3(L) 8.3(L) 8.6(L)  Total Protein 6.5 - 8.1 g/dL - - -  Total Bilirubin 0.3 - 1.2 mg/dL - - -  Alkaline Phos 38 - 126 U/L - - -  AST 15 - 41 U/L - - -  ALT 0 - 44 U/L - - -    Lab Results  Component Value Date   PTH 19  07/14/2018   CALCIUM 8.3 (L) 01/01/2019   PHOS 2.3 (L) 07/14/2018       Component Value Date/Time   COLORURINE YELLOW 12/27/2018 0118   APPEARANCEUR HAZY (A) 12/27/2018 0118   LABSPEC 1.009 12/27/2018 0118   PHURINE 5.0 12/27/2018 0118   GLUCOSEU NEGATIVE 12/27/2018 0118   HGBUR NEGATIVE 12/27/2018 0118   BILIRUBINUR NEGATIVE 12/27/2018 0118   KETONESUR NEGATIVE 12/27/2018 0118   PROTEINUR 100 (A) 12/27/2018 0118   NITRITE NEGATIVE 12/27/2018 0118   LEUKOCYTESUR NEGATIVE 12/27/2018 0118      Component Value Date/Time   PHART 7.412 07/06/2018 0850   PCO2ART 34.9 07/06/2018 0850   PO2ART 206 (H) 07/06/2018 0850   HCO3 21.9 07/06/2018 0850   ACIDBASEDEF 2.1 (H) 07/06/2018 0850   O2SAT 99.9 07/06/2018 0850       Component Value Date/Time   IRON 37 12/28/2018 0538   TIBC 161 (L) 12/28/2018 0538   FERRITIN 566 (H) 12/28/2018 0538   IRONPCTSAT 23 12/28/2018 0538       ASSESSMENT/PLAN:    The patient is a 73 y.o. year old female with a past medical history significant for hypertension, paroxysmal atrial fibrillation  on Eliquis, PE, and peptic ulcer disease presented to the [facility] emergency department on 12/18/2018 with epigastric discomfort and nausea and vomiting.  She was found to have an acute urinary tract infection and cholangitis.  She was admitted and was treated with antibiotics and underwent a laparoscopic cholecystectomy.  She developed severe anemia during that hospitalization requiring 2 units of packed red blood cells.  She had just undergone an EGD prior to that admission that was negative, and her fecal occult blood test were also negative.  Her serum creatinine had worsened to 3.7 from 2.5 on admission (1.82 in May 2020).  A renal ultrasound revealed an atrophic right kidney.  She has gained 11 pounds since her hospitalization.  The hospitalist at [facility] requested further intervention and management by nephrology.  She was subsequently transferred to [facility] for further evaluation.  1.  Acute kidney injury felt hemodynamically mediated with pre-renal and ischemic insults.  Recent significant anemia requiring packed RBCs and cholecystectomy.  No hematuria and 100 mg/dL protein. Note atrophic right kidney with decreased renal reserve  - Per prelim read, duplex is neg for renal artery stenosis  - AKI is improving with supportive care  - Repeat bladder scan   2.  Chronic kidney disease stage III/IV.  Her serum creatinine was 1.82 in May 2020.  Secondary in part to HTN as well as decreased nephron mass with right atrophic kidney, suggesting some renal vascular disease  3.  Hypertension.  Uncontrolled but refusing meds - Hydralazine IV once now.   Would avoid ACE inhibition in the setting of acute kidney injury.  Clonidine was initially decreased due to bradycardia but now back at 0.2 mg BID - she refused last night's dose.  She refused hydralazine PO as well.   - Not tolerating the labetalol well with bradycardia - will d/c - Follow-up the final read for renal artery duplex to assess for renal  artery stenosis - working to find a regimen she can tolerate   4. Proteinuria  - up/cr ratio 2970 mg/g on UP/C ratio  4.  Cholangitis status post cholecystectomy on 12/22/2018.  5.  Anemia.  Acute blood loss.  Would transfuse 1 unit of PRBC's.   6. Metabolic acidosis - no abd pain.  Setting of AKI.  Sodium bicarb 1300 mg BID

## 2019-01-01 NOTE — Care Management Important Message (Signed)
Important Message  Patient Details  Name: Jade Mclean MRN: [id number] Date of Birth: [birth year]   Medicare Important Message Given:  Yes     Orbie Pyo 01/01/2019, 3:06 PM

## 2019-01-01 NOTE — Progress Notes (Signed)
Renal artery duplex       has been completed. Preliminary results can be found under CV proc through chart review. Laneshia Pina, BS, RDMS, RVT   

## 2019-01-01 NOTE — Progress Notes (Signed)
Patient refused night time and morning meds. Education given to patient but patient still refused. Bodenheimer, NP notified. Will continue to monitor.

## 2019-01-01 NOTE — Progress Notes (Signed)
Physical Therapy Treatment Patient Details Name: Jade Mclean MRN: [id number] DOB: 02-03-1946 Today's Date: 01/01/2019    History of Present Illness Mrs. Krogh is a 73 y.o. female with PMH of hypertension, paroxysmal atrial fibrillation on Eliquis,  PE, peptic ulcer disease, presenting from  [facility] ED while transferred to [facility] for management of acute kidney injury.     PT Comments    Patient more agreeable to therapy today, sitting EOB with mod A, able to balance static EOB for greater than 10 minutes. Unsuccessful attempt to stand with max A, will need lift to chair likely.  Cont to rec SNF.   Follow Up Recommendations  SNF     Equipment Recommendations       Recommendations for Other Services       Precautions / Restrictions Precautions Precautions: Fall Restrictions Weight Bearing Restrictions: No    Mobility  Bed Mobility Overal bed mobility: Needs Assistance Bed Mobility: Supine to Sit     Supine to sit: Mod assist     General bed mobility comments: bed pad and BUE support given to patient to sit up, able to balance static EOB  Transfers                 General transfer comment: trialed standing with max A, unable at this time  Ambulation/Gait                 Stairs             Wheelchair Mobility    Modified Rankin (Stroke Patients Only)       Balance Overall balance assessment: Needs assistance Sitting-balance support: Bilateral upper extremity supported;Feet supported Sitting balance-Leahy Scale: Poor       Standing balance-Leahy Scale: Zero                              Cognition Arousal/Alertness: Awake/alert Behavior During Therapy: WFL for tasks assessed/performed Overall Cognitive Status: Impaired/Different from baseline Area of Impairment: Orientation;Memory;Safety/judgement                 Orientation Level: Disoriented to;Place;Situation;Time(reports being in hospital for  surgery.)   Memory: Decreased short-term memory   Safety/Judgement: Decreased awareness of safety;Decreased awareness of deficits     General Comments: Pt pleasantly confused required cues and reminders for engagement.       Exercises General Exercises - Lower Extremity Ankle Circles/Pumps: 20 reps Quad Sets: 20 reps Heel Slides: 20 reps Hip ABduction/ADduction: 20 reps Straight Leg Raises: 20 reps    General Comments        Pertinent Vitals/Pain Faces Pain Scale: Hurts little more Pain Location: BLE Pain Descriptors / Indicators: Aching    Home Living                      Prior Function            PT Goals (current goals can now be found in the care plan section) Acute Rehab PT Goals Patient Stated Goal: no goal stated PT Goal Formulation: With patient Potential to Achieve Goals: Fair    Frequency    Min 2X/week      PT Plan      Co-evaluation              AM-PAC PT "6 Clicks" Mobility   Outcome Measure  Help needed turning from your back to your side while in a flat bed  without using bedrails?: A Lot Help needed moving from lying on your back to sitting on the side of a flat bed without using bedrails?: A Lot Help needed moving to and from a bed to a chair (including a wheelchair)?: A Lot Help needed standing up from a chair using your arms (e.g., wheelchair or bedside chair)?: A Lot Help needed to walk in hospital room?: Total Help needed climbing 3-5 steps with a railing? : Total 6 Click Score: 10    End of Session   Activity Tolerance: Patient tolerated treatment well Patient left: in bed;with call bell/phone within reach Nurse Communication: Mobility status PT Visit Diagnosis: Unsteadiness on feet (R26.81)     Time: 2023-3435 PT Time Calculation (min) (ACUTE ONLY): 19 min  Charges:  $Therapeutic Activity: 8-22 mins                     Reinaldo Berber, PT, DPT Acute Rehabilitation Services Pager: [phone] Office:  [place] 01/01/2019, 1:39 PM

## 2019-01-01 NOTE — Progress Notes (Signed)
PROGRESS NOTE    Constancia Geeting  JJO:[id number] DOB: 10-27-1945 DOA: 12/26/2018 PCP: Algis Greenhouse, MD   Brief Narrative: Danicka Hourihan is a 73 y.o. female with a history HTN, DM, CKD baseline 1.8-2.2, pAF on Eliquis, RA on Enbrel, history of PE. Patient presented secondary to AKI.   Assessment & Plan:   Principal Problem:   Acute renal failure superimposed on stage 4 chronic kidney disease (HCC) Active Problems:   Type 2 diabetes mellitus without complication, without long-term current use of insulin (HCC)   PAF (paroxysmal atrial fibrillation) (HCC)   Anemia   Ascending cholangitis   Accelerated hypertension   History of pulmonary embolism   PUD (peptic ulcer disease)   Acute kidney injury on CKD stage IV Non-anion gap metabolic acidosis Baseline creatinine of around 2.4 with peak of 4.09 currently. Nephrology on board. UOP of 900 ml over the last 24 hours. Weight was 190 lbs on admission. Currently 191 lbs. Renal ultrasound significant for kidneys left larger than right. -Management per nephrology -Renal duplex today  Paroxysmal atrial fibrillation Stable. On Amiodarone and Eliquis as an outpatient. Eliquis held secondary to anemia. -Continue amiodarone  Anemia Unknown etiology but possible secondary to chronic kidney disease. Per chart review, patient has a history of normal EGD and negative FOBT at OSH. FOBT ordered here and is pending. Down slightly today. -Daily CBC  History of cholangitis Patient is s/p cholecystectomy and antibiotics. Recently managed.  Essential hypertension On amlodipine, clonidine, hydralazine, Imdur, labetalol as an outpatient. Difficult to control. Possibly related to RAS although no formal diagnosis. -Continue amlodipine, clonidine (increased back to 0.2 mg BID), hydralazine -Continue hydralazine IV prn  Diabetes mellitus, type 2 On Tresiba -SSI  Rheumatoid arthritis Patient on Enbrel which has been held secondary to recent  infection. Follow-up outpatient.  Hypomagnesemia Given supplementation  Pressure injury Left buttock, POA   DVT prophylaxis: SCDs Code Status:   Code Status: Full Code Family Communication: Niece on the telephone to discuss patient's desire to leave with hopes to convince patient to stay for continued management of AKI and BP in addition to placement at SNF. Disposition Plan: Discharge to SNF pending improvement of AKI    Consultants:   Nephrology  Procedures:   None  Antimicrobials:  None    Subjective: Patient states she just wants to go home and does not currently want anymore treatment. She will not give me an explanation for her decision.  Objective: Vitals:   12/31/18 2052 01/01/19 0457 01/01/19 0500 01/01/19 0844  BP: (!) 183/46 (!) 187/45  (!) 193/45  Pulse: 62 (!) 57  (!) 51  Resp: 18 18  18   Temp: 98 F (36.7 C) 99 F (37.2 C)  98.5 F (36.9 C)  TempSrc:  Oral  Oral  SpO2: 94% 98%  99%  Weight: 85.1 kg  85.1 kg   Height:        Intake/Output Summary (Last 24 hours) at 01/01/2019 1214 Last data filed at 01/01/2019 0820 Gross per 24 hour  Intake 1092.5 ml  Output 150 ml  Net 942.5 ml   Filed Weights   12/31/18 0046 12/31/18 2052 01/01/19 0500  Weight: 85.1 kg 85.1 kg 85.1 kg    Examination:  General exam: Appears calm and comfortable Respiratory system: Clear to auscultation. Respiratory effort normal. Cardiovascular system: S1 & S2 heard, RRR. No murmurs, rubs, gallops or clicks. Gastrointestinal system: Abdomen is nondistended, soft and nontender. No organomegaly or masses felt. Normal bowel sounds heard. Central nervous system:  Alert and oriented. No focal neurological deficits. Extremities: No edema. No calf tenderness Skin: No cyanosis. No rashes Psychiatry: Judgement and insight appear normal. Depressed mood and flat affect    Data Reviewed: I have personally reviewed following labs and imaging studies  CBC: Recent Labs  Lab  12/27/18 0038 12/28/18 0538 12/29/18 0542 12/30/18 0658 12/31/18 0518 01/01/19 0250  WBC 5.2 5.8 5.2 5.8 6.6 7.0  NEUTROABS 3.9  --   --   --   --   --   HGB 7.7* 8.1* 7.4* 7.3* 7.5* 7.0*  HCT 22.9* 24.4*  24.7* 22.1* 22.1* 22.9* 21.7*  MCV 87.1 87.8 88.4 87.7 87.4 88.9  PLT 167 187 177 188 196 767   Basic Metabolic Panel: Recent Labs  Lab 12/27/18 0038 12/28/18 0538 12/29/18 0542 12/30/18 0658 12/31/18 0518 01/01/19 0250  NA 137 138 136 137 136 138  K 3.9 4.0 4.1 3.7 3.7 4.0  CL 110 109 108 108 107 110  CO2 17* 16* 16* 18* 17* 17*  GLUCOSE 139* 134* 130* 110* 135* 104*  BUN 41* 44* 44* 43* 42* 41*  CREATININE 4.09* 3.92* 3.76* 3.53* 3.23* 3.13*  CALCIUM 8.2* 8.4* 8.2* 8.6* 8.3* 8.3*  MG 1.6*  --   --   --   --   --    GFR: Estimated Creatinine Clearance: 18.1 mL/min (A) (by C-G formula based on SCr of 3.13 mg/dL (H)). Liver Function Tests: Recent Labs  Lab 12/27/18 0038  AST 31  ALT 25  ALKPHOS 48  BILITOT 0.8  PROT 5.4*  ALBUMIN 2.3*   BNP (last 3 results) Recent Labs    10/23/18 1020  PROBNP 1,732*   HbA1C: No results for input(s): HGBA1C in the last 72 hours. CBG: Recent Labs  Lab 12/31/18 1135 12/31/18 1628 12/31/18 2051 01/01/19 0658 01/01/19 1118  GLUCAP 107* 87 104* 112* 118*   Lipid Profile: No results for input(s): CHOL, HDL, LDLCALC, TRIG, CHOLHDL, LDLDIRECT in the last 72 hours. Thyroid Function Tests: No results for input(s): TSH, T4TOTAL, FREET4, T3FREE, THYROIDAB in the last 72 hours. Anemia Panel: No results for input(s): VITAMINB12, FOLATE, FERRITIN, TIBC, IRON, RETICCTPCT in the last 72 hours. Sepsis Labs: No results for input(s): PROCALCITON, LATICACIDVEN in the last 168 hours.  Recent Results (from the past 240 hour(s))  Novel Coronavirus,NAA,(SEND-OUT TO REF LAB - TAT 24-48 hrs); Hosp Order     Status: None   Collection Time: 12/27/18 12:05 AM   Specimen: Nasopharyngeal Swab; Respiratory  Result Value Ref Range Status    SARS-CoV-2, NAA NOT DETECTED NOT DETECTED Final    Comment: (NOTE) This test was developed and its performance characteristics determined by [employer]. This test has not been FDA cleared or approved. This test has been authorized by FDA under an Emergency Use Authorization (EUA). This test is only authorized for the duration of time the declaration that circumstances exist justifying the authorization of the emergency use of in vitro diagnostic tests for detection of SARS-CoV-2 virus and/or diagnosis of COVID-19 infection under section 564(b)(1) of the Act, 21 U.S.C. 341PFX-9(K)(2), unless the authorization is terminated or revoked sooner. When diagnostic testing is negative, the possibility of a false negative result should be considered in the context of a patient's recent exposures and the presence of clinical signs and symptoms consistent with COVID-19. An individual without symptoms of COVID-19 and who is not shedding SARS-CoV-2 virus would expect to have a negative (not detected) result in this assay. Performed  At: [facility]  [place], [place] [id number] Rush Farmer MD KJ:[id number]    Coronavirus Source NASOPHARYNGEAL  Final    Comment: Performed at [facility], [place] [address] [place], [place] [address]         Radiology Studies: Vas US Renal Artery Duplex  Result Date: 01/01/2019 ABDOMINAL VISCERAL High Risk Factors: Hypertension. Limitations: Obesity, air/bowel gas and recent abdominal surgery.  Examination Guidelines: A complete evaluation includes B-mode imaging, spectral Doppler, color Doppler, and power Doppler as needed of all accessible portions of each vessel. Bilateral testing is considered an integral part of a complete examination. Limited examinations for reoccurring indications may be performed as noted.  Duplex Findings: +--------------------+--------+--------+------+--------+ Mesenteric          PSV cm/sEDV  cm/sPlaqueComments +--------------------+--------+--------+------+--------+ Aorta at SMA          134      28                  +--------------------+--------+--------+------+--------+ Celiac Artery Origin  266      11                  +--------------------+--------+--------+------+--------+ SMA Proximal          511      39                  +--------------------+--------+--------+------+--------+  +------------------+--------+--------+--------------+ Right Renal ArteryPSV cm/sEDV cm/s   Comment     +------------------+--------+--------+--------------+ Origin                            not visualized +------------------+--------+--------+--------------+ Proximal                          not visualized +------------------+--------+--------+--------------+ Mid                  31      7                   +------------------+--------+--------+--------------+ Distal               30      13                  +------------------+--------+--------+--------------+ +-----------------+--------+--------+--------------+ Left Renal ArteryPSV cm/sEDV cm/s   Comment     +-----------------+--------+--------+--------------+ Origin                           not visualized +-----------------+--------+--------+--------------+ Proximal                         not visualized +-----------------+--------+--------+--------------+ Mid                 43      9                   +-----------------+--------+--------+--------------+ Distal              59      7                   +-----------------+--------+--------+--------------+ +------------+--------+--------+----+-----------+--------+--------+----+ Right KidneyPSV cm/sEDV cm/sRI  Left KidneyPSV cm/sEDV cm/sRI   +------------+--------+--------+----+-----------+--------+--------+----+ Upper Pole  16      5       0.66Upper Pole 32      5       0.84  +------------+--------+--------+----+-----------+--------+--------+----+ Mid  16      5       0.69Mid        26      4       0.83 +------------+--------+--------+----+-----------+--------+--------+----+ Lower Pole  15      6       0.59Lower Pole 29      4       0.88 +------------+--------+--------+----+-----------+--------+--------+----+ Hilar       28      6       0.79Hilar      62      9       0.86 +------------+--------+--------+----+-----------+--------+--------+----+ +------------------+----+------------------+-----+ Right Kidney          Left Kidney             +------------------+----+------------------+-----+ RAR                   RAR                     +------------------+----+------------------+-----+ RAR (manual)          RAR (manual)            +------------------+----+------------------+-----+ Cortex                Cortex                  +------------------+----+------------------+-----+ Cortex thickness      Corex thickness         +------------------+----+------------------+-----+ Kidney length (cm)8.26Kidney length (cm)11.54 +------------------+----+------------------+-----+   Summary: Renal:  Right: No evidence of right renal artery stenosis in visualized        segments. Normal right Resisitive Index. Asymmetrically        smaller kidney compared to left. Left:  No evidence of left renal artery stenosis in visualized        segments. Abnormal left Resisitve Index. 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  *See table(s) above for measurements and observations.     Preliminary         Scheduled Meds: . amiodarone  200 mg Oral Q M,W,F  . amLODipine  10 mg Oral Daily  . cloNIDine  0.2 mg Oral BID  . feeding supplement (ENSURE ENLIVE)  237 mL Oral BID BM  . hydrALAZINE  100 mg Oral Q8H  . insulin aspart  0-5 Units Subcutaneous QHS  . insulin aspart  0-9 Units Subcutaneous TID WC  .  isosorbide mononitrate  30 mg Oral Daily  . labetalol  100 mg Oral BID  . multivitamin with minerals  1 tablet Oral Daily  . pantoprazole  40 mg Oral BID  . sodium bicarbonate  650 mg Oral BID  . sodium chloride flush  3 mL Intravenous Q12H  . sodium chloride flush  3 mL Intravenous Q12H   Continuous Infusions: . sodium chloride       LOS: 6 days     Cordelia Poche, MD [facility] 01/01/2019, 12:14 PM  If 7PM-7AM, please contact night-coverage [email or web address]

## 2019-01-01 NOTE — Plan of Care (Signed)
  Problem: Activity: Goal: Risk for activity intolerance will decrease Outcome: Progressing   

## 2019-01-01 NOTE — Plan of Care (Signed)
  Problem: Safety: Goal: Ability to remain free from injury will improve Outcome: Progressing   

## 2019-01-02 DIAGNOSIS — N179 Acute kidney failure, unspecified: Secondary | ICD-10-CM

## 2019-01-02 DIAGNOSIS — N184 Chronic kidney disease, stage 4 (severe): Secondary | ICD-10-CM

## 2019-01-02 LAB — CBC
HCT: 25.7 % — ABNORMAL LOW (ref 36.0–46.0)
Hemoglobin: 8.6 g/dL — ABNORMAL LOW (ref 12.0–15.0)
MCH: 29.7 pg (ref 26.0–34.0)
MCHC: 33.5 g/dL (ref 30.0–36.0)
MCV: 88.6 fL (ref 80.0–100.0)
Platelets: 159 10*3/uL (ref 150–400)
RBC: 2.9 MIL/uL — ABNORMAL LOW (ref 3.87–5.11)
RDW: 15.7 % — ABNORMAL HIGH (ref 11.5–15.5)
WBC: 6.9 10*3/uL (ref 4.0–10.5)
nRBC: 0.3 % — ABNORMAL HIGH (ref 0.0–0.2)

## 2019-01-02 LAB — GLUCOSE, CAPILLARY
Glucose-Capillary: 122 mg/dL — ABNORMAL HIGH (ref 70–99)
Glucose-Capillary: 128 mg/dL — ABNORMAL HIGH (ref 70–99)
Glucose-Capillary: 118 mg/dL — ABNORMAL HIGH (ref 70–99)
Glucose-Capillary: 111 mg/dL — ABNORMAL HIGH (ref 70–99)

## 2019-01-02 LAB — RENAL FUNCTION PANEL
Albumin: 2.3 g/dL — ABNORMAL LOW (ref 3.5–5.0)
Anion gap: 9 (ref 5–15)
BUN: 42 mg/dL — ABNORMAL HIGH (ref 8–23)
CO2: 20 mmol/L — ABNORMAL LOW (ref 22–32)
Calcium: 8.2 mg/dL — ABNORMAL LOW (ref 8.9–10.3)
Chloride: 109 mmol/L (ref 98–111)
Creatinine, Ser: 2.93 mg/dL — ABNORMAL HIGH (ref 0.44–1.00)
GFR calc Af Amer: 18 mL/min — ABNORMAL LOW (ref >60)
GFR calc non Af Amer: 15 mL/min — ABNORMAL LOW (ref >60)
Glucose, Bld: 119 mg/dL — ABNORMAL HIGH (ref 70–99)
Phosphorus: 3.7 mg/dL (ref 2.5–4.6)
Potassium: 3.7 mmol/L (ref 3.5–5.1)
Sodium: 138 mmol/L (ref 135–145)

## 2019-01-02 LAB — BPAM RBC
Blood Product Expiration Date: 202007062359
ISSUE DATE / TIME: 202006261535
Unit Type and Rh: 9500

## 2019-01-02 LAB — TYPE AND SCREEN
ABO/RH(D): O NEG
Antibody Screen: NEGATIVE
Unit division: 0

## 2019-01-02 MED ORDER — MINOXIDIL 2.5 MG PO TABS
2.5000 mg | ORAL_TABLET | Freq: Two times a day (BID) | ORAL | Status: DC
  Administered 2019-01-02 – 2019-01-06 (×9): 2.5 mg via ORAL
  Filled 2019-01-02 (×10): qty 1

## 2019-01-02 NOTE — Progress Notes (Signed)
[facility] PROGRESS NOTE  Jade Mclean NGE:[id number] DOB: Feb 17, 1946 DOA: 12/26/2018 PCP: Jade Greenhouse, MD   Narrative: 73 year old female HTN, TMD by 2 not on insulin, HLD, Massive pulmonary embolism [+/-?-Conflicting evidence and cardiologist note versus discharge summary from previously] + syncope in the remote past Upper GI bleed 07/14/2018 admission-duodenal ulcers-received 4U PRBC at that time P A. fib chads score >4-no ac? Rh arthritis-Previously Enbrel  Patient admitted RH Ed UTI/cholangitis-status post lap chole-2 units PRBC--AKI noted creatinine peaked 3.7-Baseline seems to be 1.8-work-up revealed atrophic right kidney---nephrology input sought-patient transferred to [facility] M[facility]  A & Plan AKI superimposed CKD4 [facility]ade [facility] Atrophic right kidney, duplex renal negative for significant stenosis Defer to nephrology planning-appreciate input  improving Microcytic anemia in setting of large GI bleed January 2020 Needs outpatient colonoscopy-see above regarding recent EGDs etc. Iron stores reasonable--monitor trends ?  Large PE in the past- Normal on review of Dr. Joya Mclean note he documents a large PE but on review of discharge summary from Dr. Candiss Mclean there does not appear to be any PE and it was a false positive on the VQ scan from [facility] --- ?  Continue anticoagulation-repeat V/Q in outpatient setting if felt necessary HTN-uncontrolled Minoxidil 2.5 twice daily, continue Imdur 30, Norvasc 10, clonidine 0.2 twice daily, amiodarone 200 MWF-hopeful for better control later BMI 30, stage II partial-thickness decubitus Needs significant weight loss DM TY 2 Continue sliding scale sugars 120s eating 70% Rheumatoid arthritis Previously on Enbrel which is now discontinued-follow-up?  Dr. Wynetta Mclean    DVT none code Status: Full   Communication: None currently disposition Plan: Inpatient   Jade Siebel, MD  [facility] [email or web address] 7PM-7AM contact night  coverage as above 01/02/2019, 10:00 AM  LOS: 7 days   Consultants:  Renal  Procedures:  None  Antimicrobials:  No  Interval history/Subjective: -Urine No pain Wants to go home-no fever, no chills Tells me she was born with the rashes that she has chest Does not remember what told  Objective:  Vitals:  Vitals:   01/02/19 0423 01/02/19 0926  BP: (!) 191/48 (!) 169/48  Pulse: (!) 44 (!) 45  Resp: 18 18  Temp: 98 F (36.7 C) 98.8 F (37.1 C)  SpO2: 96% 99%    Exam:  EOMI NCAT white plaque-like area across frontal chest periorbitally Jade Mclean 4 No bruit Neck soft Chest clear M0-N0 soft holosystolic murmur 2/6 not on telemetry Abdomen soft no rebound Trace lower extremity edema    I have personally reviewed the following:  DATA   Labs:  BUN/creatinine down from 3.13-2.93 CO2 up from 17-20  Albumin 2.3  Calcium 8.2  Phosphorus 3.7  Imaging studies:  Duplex ultrasound  Review and summation of old records:  yes   Scheduled Meds: . sodium chloride   Intravenous Once  . amiodarone  200 mg Oral Q M,W,F  . amLODipine  10 mg Oral Daily  . cloNIDine  0.2 mg Oral BID  . feeding supplement (ENSURE ENLIVE)  237 mL Oral BID BM  . hydrALAZINE  100 mg Oral Q8H  . insulin aspart  0-5 Units Subcutaneous QHS  . insulin aspart  0-9 Units Subcutaneous TID WC  . isosorbide mononitrate  30 mg Oral Daily  . multivitamin with minerals  1 tablet Oral Daily  . pantoprazole  40 mg Oral BID  . sodium bicarbonate  1,300 mg Oral BID  . sodium chloride flush  3 mL Intravenous Q12H  . sodium chloride flush  3 mL  Intravenous Q12H   Continuous Infusions: . sodium chloride      Principal Problem:   Acute renal failure superimposed on stage 4 chronic kidney disease (HCC) Active Problems:   Type 2 diabetes mellitus without complication, without long-term current use of insulin (HCC)   PAF (paroxysmal atrial fibrillation) (HCC)   Anemia   Ascending  cholangitis   Accelerated hypertension   History of pulmonary embolism   PUD (peptic ulcer disease)   LOS: 7 days

## 2019-01-02 NOTE — Progress Notes (Signed)
[facility]    NEPHROLOGY PROGRESS NOTE  SUBJECTIVE:  She had 750 mL UOP over 6/26 charted.  Got a unit of blood yesterday.  Doesn't feel as sleepy today.   Review of systems  Denies n/v Denies shortness of breath or chest pain No rash  No dizziness  OBJECTIVE:  Vitals:   01/02/19 0423 01/02/19 0926  BP: (!) 191/48 (!) 169/48  Pulse: (!) 44 (!) 45  Resp: 18 18  Temp: 98 F (36.7 C) 98.8 F (37.1 C)  SpO2: 96% 99%    Intake/Output Summary (Last 24 hours) at 01/02/2019 1114 Last data filed at 01/02/2019 0900 Gross per 24 hour  Intake 1110 ml  Output 750 ml  Net 360 ml      General:  AAOx3 NAD HEENT: MMM [place] AT anicteric sclera CV:  S1S2; no rub Lungs: clear and unlabored  Abd:  abd SNT/ND with normal BS Extremities:  Trace nonpitting LE edema. Skin:  No skin rash GU - purewick in place   MEDICATIONS:  . sodium chloride   Intravenous Once  . amiodarone  200 mg Oral Q M,W,F  . amLODipine  10 mg Oral Daily  . cloNIDine  0.2 mg Oral BID  . feeding supplement (ENSURE ENLIVE)  237 mL Oral BID BM  . hydrALAZINE  100 mg Oral Q8H  . insulin aspart  0-5 Units Subcutaneous QHS  . insulin aspart  0-9 Units Subcutaneous TID WC  . isosorbide mononitrate  30 mg Oral Daily  . multivitamin with minerals  1 tablet Oral Daily  . pantoprazole  40 mg Oral BID  . sodium bicarbonate  1,300 mg Oral BID  . sodium chloride flush  3 mL Intravenous Q12H  . sodium chloride flush  3 mL Intravenous Q12H       LABS:   CBC Latest Ref Rng & Units 01/01/2019 12/31/2018 12/30/2018  WBC 4.0 - 10.5 K/uL 7.0 6.6 5.8  Hemoglobin 12.0 - 15.0 g/dL 7.0(L) 7.5(L) 7.3(L)  Hematocrit 36.0 - 46.0 % 21.7(L) 22.9(L) 22.1(L)  Platelets 150 - 400 K/uL 195 196 188    CMP Latest Ref Rng & Units 01/01/2019 12/31/2018 12/30/2018  Glucose 70 - 99 mg/dL 104(H) 135(H) 110(H)  BUN 8 - 23 mg/dL 41(H) 42(H) 43(H)  Creatinine 0.44 - 1.00 mg/dL 3.13(H) 3.23(H) 3.53(H)  Sodium 135 - 145 mmol/L 138  136 137  Potassium 3.5 - 5.1 mmol/L 4.0 3.7 3.7  Chloride 98 - 111 mmol/L 110 107 108  CO2 22 - 32 mmol/L 17(L) 17(L) 18(L)  Calcium 8.9 - 10.3 mg/dL 8.3(L) 8.3(L) 8.6(L)  Total Protein 6.5 - 8.1 g/dL - - -  Total Bilirubin 0.3 - 1.2 mg/dL - - -  Alkaline Phos 38 - 126 U/L - - -  AST 15 - 41 U/L - - -  ALT 0 - 44 U/L - - -    Lab Results  Component Value Date   PTH 19 07/14/2018   CALCIUM 8.3 (L) 01/01/2019   PHOS 2.3 (L) 07/14/2018       Component Value Date/Time   COLORURINE YELLOW 12/27/2018 0118   APPEARANCEUR HAZY (A) 12/27/2018 0118   LABSPEC 1.009 12/27/2018 0118   PHURINE 5.0 12/27/2018 0118   GLUCOSEU NEGATIVE 12/27/2018 0118   HGBUR NEGATIVE 12/27/2018 0118   BILIRUBINUR NEGATIVE 12/27/2018 0118   KETONESUR NEGATIVE 12/27/2018 0118   PROTEINUR 100 (A) 12/27/2018 0118   NITRITE NEGATIVE 12/27/2018 0118   LEUKOCYTESUR NEGATIVE 12/27/2018 0118  Component Value Date/Time   PHART 7.412 07/06/2018 0850   PCO2ART 34.9 07/06/2018 0850   PO2ART 206 (H) 07/06/2018 0850   HCO3 21.9 07/06/2018 0850   ACIDBASEDEF 2.1 (H) 07/06/2018 0850   O2SAT 99.9 07/06/2018 0850       Component Value Date/Time   IRON 37 12/28/2018 0538   TIBC 161 (L) 12/28/2018 0538   FERRITIN 566 (H) 12/28/2018 0538   IRONPCTSAT 23 12/28/2018 0538       ASSESSMENT/PLAN:    The patient is a 73 y.o. year old female with a past medical history significant for hypertension, paroxysmal atrial fibrillation on Eliquis, PE, and peptic ulcer disease presented to the [facility] emergency department on 12/18/2018 with epigastric discomfort and nausea and vomiting.  She was found to have an acute urinary tract infection and cholangitis.  She was admitted and was treated with antibiotics and underwent a laparoscopic cholecystectomy.  She developed severe anemia during that hospitalization requiring 2 units of packed red blood cells.  She had just undergone an EGD prior to that admission that was negative, and  her fecal occult blood test were also negative.  Her serum creatinine had worsened to 3.7 from 2.5 on admission (1.82 in May 2020).  A CT a/p revealed an atrophic right kidney; no hydro  She has gained 11 pounds since her hospitalization.  The hospitalist at [facility] requested further intervention and management by nephrology.  She was subsequently transferred to [facility] for further evaluation.  1.  Acute kidney injury felt hemodynamically mediated with pre-renal and ischemic insults.  Recent significant anemia requiring packed RBCs and cholecystectomy.  No hematuria and 100 mg/dL protein. Note atrophic right kidney with decreased renal reserve - CT at OSH no hydro.  Duplex neg for renal artery stenosis   - AKI is slowly improving with supportive care  - BP control as below  - Repeat bladder scan at 42 mL  2.  Chronic kidney disease stage III/IV.  Her serum creatinine was 1.82 in May 2020.  Secondary in part to HTN as well as decreased nephron mass with right atrophic kidney, suggesting some renal vascular disease  3.  Hypertension.  - had been refusing meds 2/2 side effects - fatigue  - Would avoid ACE inhibition in the setting of acute kidney injury.  Clonidine was initially decreased due to bradycardia but now back at 0.2 mg BID.  She is off of labetalol 2/2 bradycardia  - Continue hydralazine and norvasc - Start minoxidil 2.5 mg BID   4. Proteinuria - up/cr ratio 2970 mg/g.  Noted 100 mg/dL protein on UA in 07/2018.  Setting of reported atrophic right kidney? Last A1c 06/2018 under 5  4.  Cholangitis status post cholecystectomy on 12/22/2018.  5.  Anemia.  Acute blood loss with improvement following PRBC's on 6/26.  S/p feraheme on 6/23.  Hold aranesp with uncontrolled HTN   6. Metabolic acidosis - secondary to AKI on CKD.  Improving with Sodium bicarb 1300 mg BID   7. PAD - noted renal artery duplex demonstrated that there was 70 to 99% stenosis in the celiac artery and superior  mesenteric artery.

## 2019-01-02 NOTE — Progress Notes (Signed)
Spoke with patient's niece, Beckie Busing, regarding how her Elenor Legato is doing today. Monique voiced concerns about what to expect when her Aunt returns home and requested an update from the physician. Page to Dr. Verlon Au for notification.

## 2019-01-02 NOTE — Progress Notes (Signed)
I have called Monique-relative x 3 today Left VM to give some salient details We will attempt call again in am  Verneita Griffes, MD [facility] 5:08 PM

## 2019-01-02 NOTE — Plan of Care (Signed)
  Problem: Safety: Goal: Ability to remain free from injury will improve Outcome: Progressing   

## 2019-01-03 ENCOUNTER — Inpatient Hospital Stay (HOSPITAL_COMMUNITY): Payer: Medicare Other

## 2019-01-03 DIAGNOSIS — I509 Heart failure, unspecified: Secondary | ICD-10-CM

## 2019-01-03 DIAGNOSIS — N183 Chronic kidney disease, stage 3 (moderate): Secondary | ICD-10-CM

## 2019-01-03 DIAGNOSIS — R1084 Generalized abdominal pain: Secondary | ICD-10-CM

## 2019-01-03 LAB — RENAL FUNCTION PANEL
Albumin: 2.5 g/dL — ABNORMAL LOW (ref 3.5–5.0)
Anion gap: 16 — ABNORMAL HIGH (ref 5–15)
BUN: 43 mg/dL — ABNORMAL HIGH (ref 8–23)
CO2: 17 mmol/L — ABNORMAL LOW (ref 22–32)
Calcium: 8.6 mg/dL — ABNORMAL LOW (ref 8.9–10.3)
Chloride: 104 mmol/L (ref 98–111)
Creatinine, Ser: 3.07 mg/dL — ABNORMAL HIGH (ref 0.44–1.00)
GFR calc Af Amer: 17 mL/min — ABNORMAL LOW (ref >60)
GFR calc non Af Amer: 15 mL/min — ABNORMAL LOW (ref >60)
Glucose, Bld: 151 mg/dL — ABNORMAL HIGH (ref 70–99)
Phosphorus: 3.8 mg/dL (ref 2.5–4.6)
Potassium: 3.9 mmol/L (ref 3.5–5.1)
Sodium: 137 mmol/L (ref 135–145)

## 2019-01-03 LAB — GLUCOSE, CAPILLARY
Glucose-Capillary: 150 mg/dL — ABNORMAL HIGH (ref 70–99)
Glucose-Capillary: 123 mg/dL — ABNORMAL HIGH (ref 70–99)
Glucose-Capillary: 150 mg/dL — ABNORMAL HIGH (ref 70–99)
Glucose-Capillary: 122 mg/dL — ABNORMAL HIGH (ref 70–99)

## 2019-01-03 MED ORDER — CALCIUM CARBONATE ANTACID 500 MG PO CHEW
400.0000 mg | CHEWABLE_TABLET | Freq: Two times a day (BID) | ORAL | Status: DC
  Administered 2019-01-04 – 2019-01-05 (×3): 400 mg via ORAL
  Filled 2019-01-03 (×7): qty 2

## 2019-01-03 MED ORDER — SUCRALFATE 1 GM/10ML PO SUSP
1.0000 g | Freq: Three times a day (TID) | ORAL | Status: DC
  Administered 2019-01-03 – 2019-01-06 (×12): 1 g via ORAL
  Filled 2019-01-03 (×13): qty 10

## 2019-01-03 MED ORDER — FAMOTIDINE 20 MG PO TABS
20.0000 mg | ORAL_TABLET | Freq: Every day | ORAL | Status: DC
  Administered 2019-01-03 – 2019-01-06 (×4): 20 mg via ORAL
  Filled 2019-01-03 (×4): qty 1

## 2019-01-03 MED ORDER — PIPERACILLIN-TAZOBACTAM IN DEX 2-0.25 GM/50ML IV SOLN
2.2500 g | Freq: Three times a day (TID) | INTRAVENOUS | Status: DC
  Administered 2019-01-03 – 2019-01-05 (×6): 2.25 g via INTRAVENOUS
  Filled 2019-01-03 (×9): qty 50

## 2019-01-03 NOTE — Progress Notes (Signed)
Spoke with niece,  Beckie Busing,  to update on patient condition. No questions @ this time, stating she had already spoke with MD today.

## 2019-01-03 NOTE — Progress Notes (Addendum)
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 fib chads score >4-no ac? Rh arthritis-Previously Enbrel  Patient admitted RH Ed UTI/cholangitis-status post lap chole-2 units PRBC--AKI noted creatinine peaked 3.7-Baseline seems to be 1.8-work-up revealed atrophic right kidney---nephrology input sought-patient transferred to [place] AKI superimposed CKD4 Mild increased in AG->see below ? Mesenteric ischemia? ddx fluid shifts? Atrophic right kidney, duplex renal negative for significant stenosis Defer to nephrology planning-appreciate input  improving Microcytic anemia in setting of large GI bleed January 2020 Abd CTA showing ? 70-99% stenosis--? Low flow state--would watch but might be at risk for Mesenteric ischemia--low threshold to call [facility] to eval for opinion Needs outpatient colonoscopy-see above regarding recent EGDs etc. Started TUMS , ? dose today--add Famotidine in addition given sympt ?  Large PE in the past- review of Dr. Joya Gaskins note he documents a large PE- d/c from Dr. Candiss Norse there does not appear to be any PE - was a false positive on the VQ scan from [facility] --- ?  Continue anticoagulation-repeat V/Q in outpatient setting if felt necessary HTN-uncontrolled Minoxidil 2.5 twice daily, continue Imdur 30, Norvasc 10, clonidine 0.2 twice daily, amiodarone 200 MWF-hopeful for better control later BMI 30, stage II partial-thickness decubitus Needs significant weight loss Will inspect skin in am when OOB DM TY 2 Continue sliding scale sugars 120--150s eating 7 only 15% Rheumatoid arthritis Previously on Enbrel which is now discontinued-follow-up?  Dr. Wynetta Emery  DVT none  code Status:  Full Communication: None currently  disposition Plan: Inpatient   Elex Mainwaring, MD  [facility] [email or web address] 7PM-7AM contact night coverage as above 01/03/2019, 12:15 PM  LOS: 8 days   Consultants:  Renal  Procedures:  None  Antimicrobials:  No  Interval history/Subjective:  asking about home Encouraged by RN to be OOB but not up today +abd pain-not eating No cp right now No dark/tarry stool---hasn't had BM ~ 1 week  Objective:  Vitals:  Vitals:   01/03/19 0430 01/03/19 0900  BP: (!) 208/48 (!) 168/47  Pulse: 69 68  Resp: 18 18  Temp: 98.6 F (37 C) 98.6 F (37 C)  SpO2: 90% 92%    Exam:  EOMI NCAT white plaque-like area across frontal chest periorbitally [place]am[place]atti 4 No bruit Neck soft Chest clear H2-Z2 soft holosystolic murmur 2/6 not on telemetry Abdomen no epig tednerness Neuro intact-sleepy though  I have personally reviewed the following:  DATA   Labs:  Hemoglobin 8.6, PLT 159, MCV 88.6  BUN/creatinine 3.07  CO2 up from 17, AG=16  Albumin 2.3  Calcium 8.2  Phosphorus 3.7  Imaging studies:  Duplex ultrasound  Review and summation of old records:  yes   Scheduled Meds: . sodium chloride   Intravenous Once  . amiodarone  200 mg Oral Q M,W,F  . amLODipine  10 mg Oral Daily  . calcium carbonate  400 mg of elemental calcium Oral BID WC  . cloNIDine  0.2 mg Oral BID  . famotidine  20 mg Oral Daily  . feeding supplement (ENSURE ENLIVE)  237 mL Oral BID BM  . hydrALAZINE  100 mg Oral Q8H  . insulin aspart  0-5 Units Subcutaneous QHS  . insulin aspart  0-9 Units Subcutaneous TID WC  .  isosorbide mononitrate  30 mg Oral Daily  . minoxidil  2.5 mg Oral BID  . multivitamin with minerals  1 tablet Oral Daily  . pantoprazole  40 mg Oral BID  . sodium bicarbonate  1,300 mg Oral BID  . sodium chloride flush  3 mL Intravenous Q12H  . sodium chloride flush  3 mL Intravenous Q12H   Continuous Infusions: .  sodium chloride      Principal Problem:   Acute renal failure superimposed on stage 4 chronic kidney disease (HCC) Active Problems:   Type 2 diabetes mellitus without complication, without long-term current use of insulin (HCC)   PAF (paroxysmal atrial fibrillation) (HCC)   Anemia   Ascending cholangitis   Accelerated hypertension   History of pulmonary embolism   PUD (peptic ulcer disease)   LOS: 8 days

## 2019-01-03 NOTE — Progress Notes (Addendum)
   01/03/19 1804  Provider Notification  Provider Name/Title Dr. Verlon Au  Date Provider Notified 01/03/19  Time Provider Notified 1800  Notification Type Page  Notification Reason Other (Comment) (Temp = 100.9)  Response See new orders (CxR, BC x2, pharmacy consult for zosyn)  Date of Provider Response 01/03/19  Time of Provider Response 1800

## 2019-01-03 NOTE — Consult Note (Signed)
Vascular and Vein Specialist of [place]  Patient name: Jade Mclean MRN: [id number] DOB: 1945/08/14 Sex: female  REASON FOR CONSULT: Evaluation of possible mesenteric ischemia  HPI: Jade Mclean is a 73 y.o. female, who is seen today for possible mesenteric ischemia.  She has a very complex history.  In looking at her old x-rays, she has a CT scan dating back to 2010 with concerns regarding weight loss and abdominal pain.  She had presented in earlier this month approximately 2 weeks ago to [facility].  Was having abdominal pain at that time and also severe urinary tract infection.  She had elevated creatinine.  Noncontrast CT from 12/18/2018 was reviewed.  This did show evidence of inflammation of her a sending colon.  She had a repeat study 2 days later showing some resolution of this.  CT scan also showed extreme calcification of her aorta and branch vessels which was also present in her study from 2010.  She probably has either complete occlusion or subtotal occlusion of her right renal artery with atretic right kidney.  Both of these were noncontrast studies.  From the level of the calcification it appears that her celiac artery is widely patent.  She does have probable stenosis at the proximal superior mesenteric artery and also high-grade stenosis more distal in the superior mesenteric artery.  She has near circumferential calcification at the takeoff of her inferior mesenteric artery.  It does appear that she does have a large inferior mesenteric artery but again this is a noncontrast study.  I suspect that this is related to chronic collateral formation in her inferior mesenteric artery.  She did have acute cholecystitis and underwent cholecystectomy.  Due to her progressive renal insufficiency with she was transferred to [facility] for further treatment.  Her creatinine has remained in the 3 range.  She does have a prior history of duodenal  ulcer with GI bleeding.  This was in late 2019 in Jade Mclean 2020.  She denies any claudication type history but on her CT scan appears to have a subtotal occlusion or complete occlusion of her left iliac artery.  She does report that she has had weight loss.  She reports that she is simply not hungry.  She denies any pain with eating or food fear.  Past Medical History:  Diagnosis Date   Acute blood loss anemia    Benign hypertension 08/16/2015   CHF (congestive heart failure) (HCC)    Chronic kidney disease, stage 3 (HCC) 02/20/2016   Chronic rheumatic arthritis ([place]) 08/16/2015   Fatigue 05/22/2018   GI bleeding 07/05/2018   Mixed hyperlipidemia 08/16/2015   Obesity (BMI 30-39.9) 09/18/2017   Osteoporosis 02/20/2016   2010: -2.1 2014: -1.8 2018: no fracture, no treatment 2019: -1.2   Type 2 diabetes mellitus without complication, without long-term current use of insulin ([place]) 08/16/2015    Family History  Problem Relation Age of Onset   Diabetes Mother    Tuberculosis Father    Diabetes Sister    Heart attack Brother    Diabetes Brother     SOCIAL HISTORY: Social History   Socioeconomic History   Marital status: Single    Spouse name: Not on file   Number of children: 0   Years of education: Not on file   Highest education level: Not on file  Occupational History   Not on file  Social Needs   Financial resource strain: Not very hard   Food insecurity    Worry:  Never true    Inability: Never true   Transportation needs    Medical: No    Non-medical: No  Tobacco Use   Smoking status: Never Smoker   Smokeless tobacco: Never Used  Substance and Sexual Activity   Alcohol use: Never    Frequency: Never   Drug use: Never   Sexual activity: Not Currently  Lifestyle   Physical activity    Days per week: 0 days    Minutes per session: 0 min   Stress: To some extent  Relationships   Social connections    Talks on phone: More than three times a week     Gets together: More than three times a week    Attends religious service: More than 4 times per year    Active member of club or organization: No    Attends meetings of clubs or organizations: Never    Relationship status: Never married   Intimate partner violence    Fear of current or ex partner: No    Emotionally abused: No    Physically abused: No    Forced sexual activity: No  Other Topics Concern   Not on file  Social History Narrative   Not on file    Allergies  Allergen Reactions   Colesevelam     Other reaction(s): GI Upset (intolerance)   Dulaglutide     Other reaction(s): GI Upset (intolerance)   Nsaids Other (See Comments)    2020: Duo ulcer with hemorrhage   Rosuvastatin     Other reaction(s): Myalgias (intolerance)   Simvastatin     Other reaction(s): Myalgias (intolerance)    Current Facility-Administered Medications  Medication Dose Route Frequency Provider Last Rate Last Dose   0.9 %  sodium chloride infusion (Manually program via Guardrails IV Fluids)   Intravenous Once Harrie Jeans C, MD       0.9 %  sodium chloride infusion  250 mL Intravenous PRN Opyd, Ilene Qua, MD       acetaminophen (TYLENOL) tablet 650 mg  650 mg Oral Q6H PRN Opyd, Ilene Qua, MD   650 mg at 01/02/19 7829   Or   acetaminophen (TYLENOL) suppository 650 mg  650 mg Rectal Q6H PRN Opyd, Ilene Qua, MD       amiodarone (PACERONE) tablet 200 mg  200 mg Oral Q M,W,F Opyd, Ilene Qua, MD   200 mg at 01/01/19 0956   amLODipine (NORVASC) tablet 10 mg  10 mg Oral Daily Opyd, Ilene Qua, MD   10 mg at 01/03/19 1055   calcium carbonate (TUMS - dosed in mg elemental calcium) chewable tablet 400 mg of elemental calcium  400 mg of elemental calcium Oral BID WC Nita Sells, MD       cloNIDine (CATAPRES) tablet 0.2 mg  0.2 mg Oral BID Harrie Jeans C, MD   0.2 mg at 01/03/19 0833   famotidine (PEPCID) tablet 20 mg  20 mg Oral Daily Nita Sells, MD   20 mg at 01/03/19  1315   feeding supplement (ENSURE ENLIVE) (ENSURE ENLIVE) liquid 237 mL  237 mL Oral BID BM Danford, Suann Larry, MD       hydrALAZINE (APRESOLINE) injection 10 mg  10 mg Intravenous Q6H PRN Claudia Desanctis, MD   10 mg at 01/01/19 1526   hydrALAZINE (APRESOLINE) tablet 100 mg  100 mg Oral Q8H Finnigan, Nancy A, DO   100 mg at 01/03/19 1315   insulin aspart (novoLOG) injection 0-5 Units  0-5 Units  Subcutaneous QHS Opyd, Ilene Qua, MD       insulin aspart (novoLOG) injection 0-9 Units  0-9 Units Subcutaneous TID WC Opyd, Ilene Qua, MD   1 Units at 01/03/19 0834   isosorbide mononitrate (IMDUR) 24 hr tablet 30 mg  30 mg Oral Daily Claudia Desanctis, MD   30 mg at 01/03/19 1054   minoxidil (LONITEN) tablet 2.5 mg  2.5 mg Oral BID Claudia Desanctis, MD   2.5 mg at 01/03/19 1054   multivitamin with minerals tablet 1 tablet  1 tablet Oral Daily Danford, Suann Larry, MD   1 tablet at 01/03/19 1054   ondansetron (ZOFRAN) tablet 4 mg  4 mg Oral Q6H PRN Opyd, Ilene Qua, MD       Or   ondansetron (ZOFRAN) injection 4 mg  4 mg Intravenous Q6H PRN Opyd, Ilene Qua, MD   4 mg at 01/01/19 1000   pantoprazole (PROTONIX) EC tablet 40 mg  40 mg Oral BID Opyd, Ilene Qua, MD   40 mg at 01/03/19 1054   sodium bicarbonate tablet 1,300 mg  1,300 mg Oral BID Claudia Desanctis, MD   1,300 mg at 01/03/19 0834   sodium chloride flush (NS) 0.9 % injection 3 mL  3 mL Intravenous Q12H Opyd, Ilene Qua, MD   3 mL at 12/29/18 1807   sodium chloride flush (NS) 0.9 % injection 3 mL  3 mL Intravenous Q12H Opyd, Ilene Qua, MD   3 mL at 01/03/19 1058   sodium chloride flush (NS) 0.9 % injection 3 mL  3 mL Intravenous PRN Opyd, Ilene Qua, MD       sucralfate (CARAFATE) 1 GM/10ML suspension 1 g  1 g Oral TID WC & HS Nita Sells, MD        REVIEW OF SYSTEMS:  Reviewed in her history and physical with nothing to add  PHYSICAL EXAM: Vitals:   01/02/19 2004 01/03/19 0430 01/03/19 0900 01/03/19 1653  BP: (!) 199/44  (!) 208/48 (!) 168/47 (!) 186/53  Pulse: (!) 57 69 68 72  Resp: 16 18 18 18   Temp: 99.1 F (37.3 C) 98.6 F (37 C) 98.6 F (37 C) (!) 100.9 F (38.3 C)  TempSrc: Oral Oral Oral Oral  SpO2: 91% 90% 92% 96%  Weight: 85.3 kg     Height:        GENERAL: The patient is a well-nourished female, in no acute distress. The vital signs are documented above. CARDIOVASCULAR: Full radial pulses bilaterally.  Absent left femoral pulse 2+ right femoral pulse and 2+ right popliteal pulse PULMONARY: There is good air exchange  ABDOMEN: Mild tenderness in her epigastrium.  Specifically nontender in her right and left abdomen. MUSCULOSKELETAL: There are no major deformities or cyanosis. NEUROLOGIC: No focal weakness or paresthesias are detected. SKIN: There are no ulcers or rashes noted. PSYCHIATRIC: The patient has a normal affect.  DATA:  Noncontrast CT from 12/18/2018 and 12/20/2018 images were reviewed.  As discussed above does have's extreme calcification of her aortic iliac segments and also branch vessels with probable right renal artery occlusion and high-grade superior mesenteric artery stenosis.  Does appear to have patent celiac artery.  White blood cell count normal  MEDICAL ISSUES: Very complex patient.  Documented severe calcification with stenosis of her aorta and branch vessels dating back at least 10 years.  Had admission for acute cholecystitis.  Also has had documented duodenal ulcer with GI bleeding associated with this.  May have symptoms of chronic  mesenteric ischemia.  She is obese with a BMI of 30 but does report weight loss.  No classic symptoms of food fear or pain with eating but does report she is simply not hungry.  Commend supportive care as is being done currently as her renal function recovers.  Would certainly try to avoid any iodinated contrast imaging studies if at all possible due to her renal insufficiency.  We will follow with you   Rosetta Posner, MD [facility] Vascular  and Vein Specialists of [facility] Tel [phone] Pager [phone]

## 2019-01-03 NOTE — Progress Notes (Signed)
[facility]   NEPHROLOGY PROGRESS NOTE  SUBJECTIVE:  She had 1.3 L UOP over 6/27 charted.  Has felt more sleepy and been napping more recently.  Normally on lasix daily at home she states.  She has required low rate of oxygen - 1.5 l/min on my exam.  Weight stable/down 1 kg since admit.  Review of systems  Denies n/v Denies shortness of breath or chest pain No rash  No dizziness Appetite not good  OBJECTIVE:  Vitals:   01/03/19 0430 01/03/19 0900  BP: (!) 208/48 (!) 168/47  Pulse: 69 68  Resp: 18 18  Temp: 98.6 F (37 C) 98.6 F (37 C)  SpO2: 90% 92%    Intake/Output Summary (Last 24 hours) at 01/03/2019 1009 Last data filed at 01/03/2019 0600 Gross per 24 hour  Intake 360 ml  Output 1300 ml  Net -940 ml      General:  Adult female in bed in no acute distress HEENT: MMM [place] AT anicteric sclera CV:  S1S2; no rub Lungs: clear and unlabored  Abd:  abd SNT/ND with normal BS Extremities:  There is no pitting LE edema Skin:  No skin rash GU - purewick in place   MEDICATIONS:  . sodium chloride   Intravenous Once  . amiodarone  200 mg Oral Q M,W,F  . amLODipine  10 mg Oral Daily  . cloNIDine  0.2 mg Oral BID  . feeding supplement (ENSURE ENLIVE)  237 mL Oral BID BM  . hydrALAZINE  100 mg Oral Q8H  . insulin aspart  0-5 Units Subcutaneous QHS  . insulin aspart  0-9 Units Subcutaneous TID WC  . isosorbide mononitrate  30 mg Oral Daily  . minoxidil  2.5 mg Oral BID  . multivitamin with minerals  1 tablet Oral Daily  . pantoprazole  40 mg Oral BID  . sodium bicarbonate  1,300 mg Oral BID  . sodium chloride flush  3 mL Intravenous Q12H  . sodium chloride flush  3 mL Intravenous Q12H     LABS:  CBC Latest Ref Rng & Units 01/02/2019 01/01/2019 12/31/2018  WBC 4.0 - 10.5 K/uL 6.9 7.0 6.6  Hemoglobin 12.0 - 15.0 g/dL 8.6(L) 7.0(L) 7.5(L)  Hematocrit 36.0 - 46.0 % 25.7(L) 21.7(L) 22.9(L)  Platelets 150 - 400 K/uL 159 195 196    CMP Latest Ref Rng & Units  01/03/2019 01/02/2019 01/01/2019  Glucose 70 - 99 mg/dL 151(H) 119(H) 104(H)  BUN 8 - 23 mg/dL 43(H) 42(H) 41(H)  Creatinine 0.44 - 1.00 mg/dL 3.07(H) 2.93(H) 3.13(H)  Sodium 135 - 145 mmol/L 137 138 138  Potassium 3.5 - 5.1 mmol/L 3.9 3.7 4.0  Chloride 98 - 111 mmol/L 104 109 110  CO2 22 - 32 mmol/L 17(L) 20(L) 17(L)  Calcium 8.9 - 10.3 mg/dL 8.6(L) 8.2(L) 8.3(L)  Total Protein 6.5 - 8.1 g/dL - - -  Total Bilirubin 0.3 - 1.2 mg/dL - - -  Alkaline Phos 38 - 126 U/L - - -  AST 15 - 41 U/L - - -  ALT 0 - 44 U/L - - -    Lab Results  Component Value Date   PTH 19 07/14/2018   CALCIUM 8.6 (L) 01/03/2019   PHOS 3.8 01/03/2019       Component Value Date/Time   COLORURINE YELLOW 12/27/2018 0118   APPEARANCEUR HAZY (A) 12/27/2018 0118   LABSPEC 1.009 12/27/2018 0118   PHURINE 5.0 12/27/2018 0118   GLUCOSEU NEGATIVE 12/27/2018 0118   HGBUR NEGATIVE  12/27/2018 0118   BILIRUBINUR NEGATIVE 12/27/2018 0118   KETONESUR NEGATIVE 12/27/2018 0118   PROTEINUR 100 (A) 12/27/2018 0118   NITRITE NEGATIVE 12/27/2018 0118   LEUKOCYTESUR NEGATIVE 12/27/2018 0118      Component Value Date/Time   PHART 7.412 07/06/2018 0850   PCO2ART 34.9 07/06/2018 0850   PO2ART 206 (H) 07/06/2018 0850   HCO3 21.9 07/06/2018 0850   ACIDBASEDEF 2.1 (H) 07/06/2018 0850   O2SAT 99.9 07/06/2018 0850       Component Value Date/Time   IRON 37 12/28/2018 0538   TIBC 161 (L) 12/28/2018 0538   FERRITIN 566 (H) 12/28/2018 0538   IRONPCTSAT 23 12/28/2018 0538       ASSESSMENT/PLAN:    The patient is a 73 y.o. year old female with a past medical history significant for hypertension, paroxysmal atrial fibrillation on Eliquis, PE, and peptic ulcer disease presented to the [facility] emergency department on 12/18/2018 with epigastric discomfort and nausea and vomiting.  She was found to have an acute urinary tract infection and cholangitis.  She was admitted and was treated with antibiotics and underwent a laparoscopic  cholecystectomy.  She developed severe anemia during that hospitalization requiring 2 units of packed red blood cells.  She had just undergone an EGD prior to that admission that was negative, and her fecal occult blood test were also negative.  Her serum creatinine had worsened to 3.7 from 2.5 on admission (1.82 in May 2020).  A CT a/p revealed an atrophic right kidney; no hydro  She had gained 11 pounds at OSH since her hospitalization.  The hospitalist at [facility] requested further intervention and management by nephrology.  She was subsequently transferred to [facility] for further evaluation.  1.  Acute kidney injury felt hemodynamically mediated with pre-renal and ischemic insults.  Recent significant anemia requiring packed RBCs and cholecystectomy.  No hematuria and 100 mg/dL protein. Note atrophic right kidney with decreased renal reserve.  CT at OSH with no hydro.   - AKI is slowly improving with supportive care but appears to have reached plateau in improvement  - BP control as below   2.  Chronic kidney disease stage III/IV.  Her serum creatinine was 1.82 in May 2020.  Secondary in part to HTN as well as decreased nephron mass with right atrophic kidney, suggesting some renal vascular disease  3.  Hypertension - Duplex neg for renal artery stenosis   - had intermittently been refusing meds 2/2 side effects - fatigue  - Would avoid ACE inhibition in the setting of acute kidney injury.   - Clonidine was initially decreased due to bradycardia but now back at 0.2 mg BID.  Cannot titrate 2/2 fatigue.  She is off of labetalol 2/2 bradycardia  - Continue hydralazine and norvasc and imdur   - Started minoxidil 2.5 mg BID - BP improved to 168/47 most recently.  Anticipate titration  - GN w/u   4. Proteinuria - up/cr ratio 2970 mg/g.  Noted 100 mg/dL protein on UA in 07/2018.  Setting of reported atrophic right kidney? Last A1c 06/2018 under 5 - Sent ANA, ANCA, SPEP, UPEP, free light chains     4.  Cholangitis status post cholecystectomy on 12/22/2018.  5.  Anemia.  Acute blood loss with improvement following PRBC's on 6/26.  S/p feraheme on 6/23.  Hold aranesp with uncontrolled HTN   6. Metabolic acidosis - secondary to AKI on CKD.  On sodium bicarb 1300 mg BID   7. PAD - noted renal  artery duplex demonstrated that there was 70 to 99% stenosis in the celiac artery and superior mesenteric artery.

## 2019-01-03 NOTE — Progress Notes (Addendum)
s D/w RN--still have abd pain, not as awake Am not clear why she is still having abd pain  -DDX Chronic Mesenteric ischemia?  Has abd pain--better than morning 10/10----Famotidine---->5/10  anorexia  Wght loss from 07/2018 was 99.7 k--->now 85  P Add carafate D/w VVS--Greatly appreciate Dr. Donnetta Hutching VVS consult re: putative diagnosis:Mesenteric ischmia  Updated [place]  Verneita Griffes, MD [facility] 4:27 PM

## 2019-01-03 NOTE — Progress Notes (Signed)
Pharmacy Antibiotic Note  Jade Mclean is a 73 y.o. female admitted on 12/26/2018 with acute on chronic renal disease.  Pt had been admitted to [facility] since 6/12 and was transferred to [facility] 6/20 for renal eval.  Pt recently underwent lap-chole 6/16 and now with concern for mesenteric ischemia.   Pharmacy has been consulted for Zosyn dosing for possible intra-abdominal infection.  WBC wnl.  Tm 100.7  Plan: Zosyn 2.25 gm IV q8h (dose adjusted for CrCl <20)  Height: 5\' 6"  (167.6 cm) Weight: 188 lb 0.8 oz (85.3 kg) IBW/kg (Calculated) : 59.3  Temp (24hrs), Avg:99.3 F (37.4 C), Min:98.6 F (37 C), Max:100.9 F (38.3 C)  Recent Labs  Lab 12/29/18 0542 12/30/18 0658 12/31/18 0518 01/01/19 0250 01/02/19 1049 01/03/19 0459  WBC 5.2 5.8 6.6 7.0 6.9  --   CREATININE 3.76* 3.53* 3.23* 3.13* 2.93* 3.07*    Estimated Creatinine Clearance: 18.5 mL/min (A) (by C-G formula based on SCr of 3.07 mg/dL (H)).    Allergies  Allergen Reactions  . Colesevelam     Other reaction(s): GI Upset (intolerance)  . Dulaglutide     Other reaction(s): GI Upset (intolerance)  . Nsaids Other (See Comments)    2020: Duo ulcer with hemorrhage  . Rosuvastatin     Other reaction(s): Myalgias (intolerance)  . Simvastatin     Other reaction(s): Myalgias (intolerance)     Thank you for allowing pharmacy to be a part of this patient's care.  [employer], Pharm.D., BCPS Clinical Pharmacist  **Pharmacist phone directory can now be found on [email or web address] ([email or web address]).  Listed under [place].  01/03/2019 6:22 PM

## 2019-01-04 LAB — CBC WITH DIFFERENTIAL/PLATELET
Abs Immature Granulocytes: 0.03 10*3/uL (ref 0.00–0.07)
Basophils Absolute: 0 10*3/uL (ref 0.0–0.1)
Basophils Relative: 0 %
Eosinophils Absolute: 0.1 10*3/uL (ref 0.0–0.5)
Eosinophils Relative: 2 %
HCT: 23.5 % — ABNORMAL LOW (ref 36.0–46.0)
Hemoglobin: 7.8 g/dL — ABNORMAL LOW (ref 12.0–15.0)
Immature Granulocytes: 0 %
Lymphocytes Relative: 5 %
Lymphs Abs: 0.4 10*3/uL — ABNORMAL LOW (ref 0.7–4.0)
MCH: 29.9 pg (ref 26.0–34.0)
MCHC: 33.2 g/dL (ref 30.0–36.0)
MCV: 90 fL (ref 80.0–100.0)
Monocytes Absolute: 0.7 10*3/uL (ref 0.1–1.0)
Monocytes Relative: 8 %
Neutro Abs: 6.8 10*3/uL (ref 1.7–7.7)
Neutrophils Relative %: 85 %
Platelets: 169 10*3/uL (ref 150–400)
RBC: 2.61 MIL/uL — ABNORMAL LOW (ref 3.87–5.11)
RDW: 15.8 % — ABNORMAL HIGH (ref 11.5–15.5)
WBC: 8 10*3/uL (ref 4.0–10.5)
nRBC: 0 % (ref 0.0–0.2)

## 2019-01-04 LAB — RENAL FUNCTION PANEL
Albumin: 2.2 g/dL — ABNORMAL LOW (ref 3.5–5.0)
Anion gap: 11 (ref 5–15)
BUN: 46 mg/dL — ABNORMAL HIGH (ref 8–23)
CO2: 20 mmol/L — ABNORMAL LOW (ref 22–32)
Calcium: 8.4 mg/dL — ABNORMAL LOW (ref 8.9–10.3)
Chloride: 107 mmol/L (ref 98–111)
Creatinine, Ser: 2.92 mg/dL — ABNORMAL HIGH (ref 0.44–1.00)
GFR calc Af Amer: 18 mL/min — ABNORMAL LOW (ref >60)
GFR calc non Af Amer: 16 mL/min — ABNORMAL LOW (ref >60)
Glucose, Bld: 129 mg/dL — ABNORMAL HIGH (ref 70–99)
Phosphorus: 3.8 mg/dL (ref 2.5–4.6)
Potassium: 3.9 mmol/L (ref 3.5–5.1)
Sodium: 138 mmol/L (ref 135–145)

## 2019-01-04 LAB — KAPPA/LAMBDA LIGHT CHAINS
Kappa free light chain: 111.3 mg/L — ABNORMAL HIGH (ref 3.3–19.4)
Kappa, lambda light chain ratio: 1.74 — ABNORMAL HIGH (ref 0.26–1.65)
Lambda free light chains: 63.9 mg/L — ABNORMAL HIGH (ref 5.7–26.3)

## 2019-01-04 LAB — GLUCOSE, CAPILLARY
Glucose-Capillary: 144 mg/dL — ABNORMAL HIGH (ref 70–99)
Glucose-Capillary: 159 mg/dL — ABNORMAL HIGH (ref 70–99)
Glucose-Capillary: 131 mg/dL — ABNORMAL HIGH (ref 70–99)
Glucose-Capillary: 134 mg/dL — ABNORMAL HIGH (ref 70–99)

## 2019-01-04 LAB — ANA: Anti Nuclear Antibody (ANA): NEGATIVE

## 2019-01-04 MED ORDER — BOOST / RESOURCE BREEZE PO LIQD CUSTOM
1.0000 | Freq: Three times a day (TID) | ORAL | Status: DC
  Administered 2019-01-04 (×2): 1 via ORAL
  Filled 2019-01-04 (×11): qty 1

## 2019-01-04 NOTE — Progress Notes (Addendum)
Nutrition Follow-up  DOCUMENTATION CODES:   Not applicable  INTERVENTION:   -Continue MVI with minerals -D/c Ensure Enlive, due to poor acceptance -Boost Breeze po TID, each supplement provides 250 kcal and 9 grams of protein -Magic cup TID with meals, each supplement provides 290 kcal and 9 grams of protein -Consider initiation of bowel regimen; last BM documented 12/26/18  NUTRITION DIAGNOSIS:   Increased nutrient needs related to post-op healing as evidenced by estimated needs.  Ongoing  GOAL:   Patient will meet greater than or equal to 90% of their needs  Unmet  MONITOR:   PO intake, Supplement acceptance, Labs, Weight trends, I & O's, Skin  REASON FOR ASSESSMENT:   Malnutrition Screening Tool    ASSESSMENT:   Patient with PMH significant for HTN, DM, CKD III, CHF, and osteoporosis. Admitted to [facility] for management of AKI and cholangitis. Underwent lap chole on 6/16. Transferred to [facility] for further evaluation of AKI on CKD.  Reviewed I/O's: -335 ml x 24 hours and -2.1 L since admission  UOP: 625 ml x 24 hours  Per vascular surgery notes, pt with chronic mesenteric stenosis; recommending GI consult for symptom management.   Spoke with pt at bedside, who reports her appetite has been "horrible" over the past week. She shares that her nausea has involved since being prescribed tums ("I've been taking a lot of those"). She denies any vomiting and abdominal pain before or after eating. Noted meal completion 0-15%. Pt reports that she consumed 3-4 spoonfuls of cheerios today without difficulty. She also does well with drinking liquids, such as juices and milk. She has been refusing Ensure "because I throw up whenever I try to drink it".   Discussed with pt importance of good meal and supplement intake to promote healing. RD reviewed other supplement options on formulary- pt amenable to both Boost Breeze and [employer].   Noted pt with no documented BM since  12/26/18; suspect poor oral intake is contributing, however, may benefit from bowel regimen.  Medications reviewed and include calcium carbonate.   Labs reviewed: CBGS: 122-144 (inpatient orders for glycemic control are 0-5 units insulin aspart q HS and 0-9 units insulin aspart TID with meals).  NUTRITION - FOCUSED PHYSICAL EXAM:    Most Recent Value  Orbital Region  No depletion  Upper Arm Region  No depletion  Thoracic and Lumbar Region  No depletion  Buccal Region  No depletion  Temple Region  No depletion  Clavicle Bone Region  No depletion  Clavicle and Acromion Bone Region  No depletion  Scapular Bone Region  No depletion  Dorsal Hand  No depletion  Patellar Region  No depletion  Anterior Thigh Region  No depletion  Posterior Calf Region  No depletion  Edema (RD Assessment)  Mild  Hair  Reviewed  Eyes  Reviewed  Mouth  Reviewed  Skin  Reviewed  Nails  Reviewed       Diet Order:   Diet Order            Diet heart healthy/carb modified Room service appropriate? Yes; Fluid consistency: Thin  Diet effective now              EDUCATION NEEDS:   Not appropriate for education at this time  Skin:  Skin Assessment: Skin Integrity Issues: Skin Integrity Issues:: Stage II Stage II: buttocks  Last BM:  6/20  Height:   Ht Readings from Last 1 Encounters:  12/26/18 5\' 6"  (1.676 m)  Weight:   Wt Readings from Last 1 Encounters:  01/04/19 86.9 kg    Ideal Body Weight:  59.1 kg  BMI:  Body mass index is 30.92 kg/m.  Estimated Nutritional Needs:   Kcal:  1650-1850 kcal  Protein:  80-95 grams  Fluid:  >/= 1.6 L/day    Jade Mclean A. Jade Mclean, RD, LDN, Jade Mclean Registered Dietitian II Certified Diabetes Care and Education Specialist Pager: [phone] After hours Pager: [phone]

## 2019-01-04 NOTE — Progress Notes (Signed)
[facility] NEPHROLOGY PROGRESS NOTE  Assessment/ Plan: Pt is a 73 y.o. yo female with history of hypertension, A. fib on Eliquis, PE initially presented to [facility] ER on 6/12 with epigastric discomfort, N/V, found to have UTI and cholangitis.  Received antibiotics and underwent lap cholecystectomy.  Subsequently she developed severe anemia received 2 packed red blood red blood cell transfusion.  The serum creatinine level worsened to 3.7 from 2.5 on admission.  CT scan showed atrophy right kidney.  #Acute kidney injury hemodynamically mediated with ischemic insult in the setting of severe anemia and atrophic right kidney.  She has decreased renal mass.  Nonoliguric and serum creatinine level is stable around 2.9.  Continue sodium bicarbonate.  #CKD stage III/IV: The creatinine level was 1.8 in May 2020 probably due to hypertension and decrease renal mass.  Right renal atrophy suggesting underlying renovascular disease/atherosclerosis.  #Hypertension: Duplex negative for renal artery stenosis.She is currently on amlodipine, clonidine, hydralazine, Imdur, minoxidil.  Monitor blood pressure.  Not on beta-blocker because of bradycardia.  #Proteinuria probably due to hyperfiltration in the setting of atrophic right kidney.  ANA negative.  Follow-up other serologies.  #Celiac and superior mesenteric artery stenosis: Evaluation ongoing by vascular.  Subjective: Seen and examined at bedside.  No new event.  No nausea, vomiting, chest pain or shortness of breath.  Nonoliguric. Objective Vital signs in last 24 hours: Vitals:   01/04/19 0500 01/04/19 0511 01/04/19 0944 01/04/19 1035  BP:  (!) 173/67 (!) 189/58 (!) 158/54  Pulse:  62 62   Resp:  18 18   Temp:  98.6 F (37 C) 98.2 F (36.8 C)   TempSrc:  Oral Oral   SpO2:  95% 94%   Weight: 86.9 kg     Height:       Weight change: 1.6 kg  Intake/Output Summary (Last 24 hours) at 01/04/2019 1520 Last data filed at 01/04/2019  1300 Gross per 24 hour  Intake 320 ml  Output 300 ml  Net 20 ml       Labs: Basic Metabolic Panel: Recent Labs  Lab 01/02/19 1049 01/03/19 0459 01/04/19 0658  NA 138 137 138  K 3.7 3.9 3.9  CL 109 104 107  CO2 20* 17* 20*  GLUCOSE 119* 151* 129*  BUN 42* 43* 46*  CREATININE 2.93* 3.07* 2.92*  CALCIUM 8.2* 8.6* 8.4*  PHOS 3.7 3.8 3.8   Liver Function Tests: Recent Labs  Lab 01/02/19 1049 01/03/19 0459 01/04/19 0658  ALBUMIN 2.3* 2.5* 2.2*   No results for input(s): LIPASE, AMYLASE in the last 168 hours. No results for input(s): AMMONIA in the last 168 hours. CBC: Recent Labs  Lab 12/30/18 0658 12/31/18 0518 01/01/19 0250 01/02/19 1049 01/04/19 0658  WBC 5.8 6.6 7.0 6.9 8.0  NEUTROABS  --   --   --   --  6.8  HGB 7.3* 7.5* 7.0* 8.6* 7.8*  HCT 22.1* 22.9* 21.7* 25.7* 23.5*  MCV 87.7 87.4 88.9 88.6 90.0  PLT 188 196 195 159 169   Cardiac Enzymes: No results for input(s): CKTOTAL, CKMB, CKMBINDEX, TROPONINI in the last 168 hours. CBG: Recent Labs  Lab 01/03/19 1134 01/03/19 1650 01/03/19 2125 01/04/19 0737 01/04/19 1135  GLUCAP 123* 150* 122* 144* 159*    Iron Studies: No results for input(s): IRON, TIBC, TRANSFERRIN, FERRITIN in the last 72 hours. Studies/Results: Dg Chest Port 1 View  Result Date: 01/03/2019 CLINICAL DATA:  Fluid overload EXAM: PORTABLE CHEST 1 VIEW COMPARISON:  Portable exam 1610  hours compared to 07/05/2018 FINDINGS: Rotated to the LEFT. Enlargement of cardiac silhouette. Pulmonary vascular congestion. Atherosclerotic calcifications aorta. Bibasilar effusions and atelectasis greater on LEFT. Mild pulmonary edema. No pneumothorax. Bones demineralized. IMPRESSION: Mild CHF with bibasilar effusions and atelectasis. Electronically Signed   By: Lavonia Dana M.D.   On: 01/03/2019 18:51    Medications: Infusions: . sodium chloride    . piperacillin-tazobactam (ZOSYN)  IV 2.25 g (01/04/19 1106)    Scheduled Medications: . sodium  chloride   Intravenous Once  . amiodarone  200 mg Oral Q M,W,F  . amLODipine  10 mg Oral Daily  . calcium carbonate  400 mg of elemental calcium Oral BID WC  . cloNIDine  0.2 mg Oral BID  . famotidine  20 mg Oral Daily  . feeding supplement  1 Container Oral TID BM  . hydrALAZINE  100 mg Oral Q8H  . insulin aspart  0-5 Units Subcutaneous QHS  . insulin aspart  0-9 Units Subcutaneous TID WC  . isosorbide mononitrate  30 mg Oral Daily  . minoxidil  2.5 mg Oral BID  . multivitamin with minerals  1 tablet Oral Daily  . pantoprazole  40 mg Oral BID  . sodium bicarbonate  1,300 mg Oral BID  . sodium chloride flush  3 mL Intravenous Q12H  . sodium chloride flush  3 mL Intravenous Q12H  . sucralfate  1 g Oral TID WC & HS    have reviewed scheduled and prn medications.  Physical Exam: General:NAD, comfortable Heart:RRR, s1s2 nl Lungs:clear b/l, no crackle Abdomen:soft, Non-tender, non-distended Extremities:No edema Neurology: Alert, awake and following commands.  Grayton Lobo Prasad Shalaunda Weatherholtz 01/04/2019,3:20 PM  LOS: 9 days  Pager: [phone]

## 2019-01-04 NOTE — Progress Notes (Signed)
OT Cancellation Note  Patient Details Name: Jade Mclean MRN: [id number] DOB: [birth year]   Cancelled Treatment:    Reason Eval/Treat Not Completed: Fatigue/lethargy limiting ability to participate;Patient declined, no reason specified. Pt declined session reports she is too tired to engage even with encouragement. Pt prefers to be seen tomorrow.   Minus Breeding, MSOT, OTR/L  [facility]  [phone]   [name] 01/04/2019, 3:09 PM

## 2019-01-04 NOTE — Care Management Important Message (Signed)
Important Message  Patient Details  Name: Jade Mclean MRN: [id number] Date of Birth: [birth year]   Medicare Important Message Given:  Yes     Lanecia Sliva Montine Circle 01/04/2019, 4:06 PM

## 2019-01-04 NOTE — Progress Notes (Signed)
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 fib chads score >4-no ac? Rh arthritis-Previously Enbrel  Patient admitted [facility] Ed UTI/cholangitis-status post lap chole-2 units PRBC--AKI noted creatinine peaked 3.7-Baseline seems to be 1.8-work-up revealed atrophic right kidney---nephrology input sought-patient transferred to [place] AKI superimposed CKD4 Mild increased in AG->see below ? Mesenteric ischemia? Atrophic right kidney, duplex renal negative for significant stenosis Defer to nephrology planning-appreciate input  Microcytic anemia in setting of large GI bleed January 2020 Abd CTA showing ? 70-99% stenosis--? Low flow state--started abx Zosyn 6/29 give AG acidosis + low grade fever without other source--Vascular to comment further appreciate input. Conflicting evidence on need for [facility] [?mainly for acute/chronic]--defer to vasc surgery, would hold [facility] for right now as ? Might need stent vs Angioplasty? Transfusion threshold below 7 continue TUMS,Famotidine protonix--Her symptoms are better--has no abd pain today--would continue.  HOld on GI consult unless symptomatic with Bleeding from Esophagus/rectum ?  Large PE in the past review of Dr. Joya Gaskins note he documents a large PE- d/c from Dr. Candiss Norse there does not appear to be any PE - was a false positive on the VQ scan from [facility] ---repeat V/Q in outpatient setting if felt necessary HTN-uncontrolled still P Afib [place] > 4 Minoxidil 2.5 twice daily, continue Imdur 30, Norvasc 10, clonidine 0.2 twice daily, amiodarone 200 MWF Will need to possibly resume AC with elliquis if no over further bleed/drop in hemoglobin BMI 30, stage II partial-thickness  decubitus Needs significant weight loss DM TY 2 Continue sliding scale sugars 120-160s ate 55% of breakfast Rheumatoid arthritis Previously on Enbrel which is now discontinued-follow-up?  Dr. Wynetta Emery  DVT none  code Status: Full Communication: None currently  disposition Plan: Inpatient   Jade Wyss, MD  [facility] [employer] [email or web address] 7PM-7AM contact night coverage as above 01/04/2019, 2:47 PM  LOS: 9 days   Consultants:  Renal  Procedures:  None  Antimicrobials:  No  Interval history/Subjective:  Less abd pain Doesn't like food No aversion--just doesn't wanna eat Cannot sleep No cp No stool yet hasnt been OOB in days  Objective:  Vitals:  Vitals:   01/04/19 0944 01/04/19 1035  BP: (!) 189/58 (!) 158/54  Pulse: 62   Resp: 18   Temp: 98.2 F (36.8 C)   SpO2: 94%     Exam:  EOMI NCAT white plaque-like area across frontal chest periorbitally [place]atti 4 No bruit Neck soft Chest clear T6-A2 soft holosystolic murmur 2/6 not on telemetry Abdomen no epig tednerness Neuro intact-more awake moving limbs x 4 no deficit  I have personally reviewed the following:  DATA   Labs:  Hemoglobin 8.6->7.8  BUN/creatinine 3.07--->46/2.9  CO2 up from 17-->20  AG 16--->11  Phosphorus 3.8  Imaging studies:  Duplex ultrasound  Review and summation of old records:  yes   Scheduled Meds: . sodium chloride   Intravenous Once  . amiodarone  200 mg Oral Q M,W,F  . amLODipine  10 mg Oral Daily  . calcium carbonate  400 mg of elemental calcium Oral BID WC  . cloNIDine  0.2 mg Oral BID  . famotidine  20 mg Oral Daily  . feeding supplement  1 Container Oral TID BM  . hydrALAZINE  100 mg  Oral Q8H  . insulin aspart  0-5 Units Subcutaneous QHS  . insulin aspart  0-9 Units Subcutaneous TID WC  . isosorbide mononitrate  30 mg Oral Daily  . minoxidil  2.5 mg Oral BID  . multivitamin with minerals  1 tablet Oral Daily  . pantoprazole   40 mg Oral BID  . sodium bicarbonate  1,300 mg Oral BID  . sodium chloride flush  3 mL Intravenous Q12H  . sodium chloride flush  3 mL Intravenous Q12H  . sucralfate  1 g Oral TID WC & HS   Continuous Infusions: . sodium chloride    . piperacillin-tazobactam (ZOSYN)  IV 2.25 g (01/04/19 1106)    Principal Problem:   Acute renal failure superimposed on stage 4 chronic kidney disease (HCC) Active Problems:   Type 2 diabetes mellitus without complication, without long-term current use of insulin (HCC)   PAF (paroxysmal atrial fibrillation) (HCC)   Anemia   Ascending cholangitis   Accelerated hypertension   History of pulmonary embolism   PUD (peptic ulcer disease)   LOS: 9 days

## 2019-01-04 NOTE — Progress Notes (Signed)
Patient's niece was called and updated on her aunt.

## 2019-01-04 NOTE — Progress Notes (Addendum)
  Progress Note    01/04/2019 9:15 AM  Subjective:  Persistent abd pain but no worse today.  No appetite for breakfast.  Patient says she has appetite for "oranges." Denies rest pain L foot.   Vitals:   01/03/19 2125 01/04/19 0511  BP: (!) 190/46 (!) 173/67  Pulse: 70 62  Resp: 15 18  Temp: 99.7 F (37.6 C) 98.6 F (37 C)  SpO2: 97% 95%   Physical Exam: Lungs:  Non labored on O2 by [place] Extremities:  Easily palpable R DP, no LLE peripheral pulses, no tissue loss L foot Abdomen:  Soft, diffusely tender to deep palpation, no distention Neurologic: A&O  CBC    Component Value Date/Time   WBC 8.0 01/04/2019 0658   RBC 2.61 (L) 01/04/2019 0658   HGB 7.8 (L) 01/04/2019 0658   HGB 10.9 (L) 10/23/2018 1020   HCT 23.5 (L) 01/04/2019 0658   HCT 24.7 (L) 12/28/2018 0538   PLT 169 01/04/2019 0658   PLT 167 10/23/2018 1020   MCV 90.0 01/04/2019 0658   MCV 88 10/23/2018 1020   MCH 29.9 01/04/2019 0658   MCHC 33.2 01/04/2019 0658   RDW 15.8 (H) 01/04/2019 0658   RDW 13.8 10/23/2018 1020   LYMPHSABS 0.4 (L) 01/04/2019 0658   MONOABS 0.7 01/04/2019 0658   EOSABS 0.1 01/04/2019 0658   BASOSABS 0.0 01/04/2019 0658    BMET    Component Value Date/Time   NA 138 01/04/2019 0658   NA 142 11/24/2018 1046   K 3.9 01/04/2019 0658   CL 107 01/04/2019 0658   CO2 20 (L) 01/04/2019 0658   GLUCOSE 129 (H) 01/04/2019 0658   BUN 46 (H) 01/04/2019 0658   BUN 18 11/24/2018 1046   CREATININE 2.92 (H) 01/04/2019 0658   CALCIUM 8.4 (L) 01/04/2019 0658   GFRNONAA 16 (L) 01/04/2019 0658   GFRAA 18 (L) 01/04/2019 0658    INR No results found for: INR   Intake/Output Summary (Last 24 hours) at 01/04/2019 0915 Last data filed at 01/04/2019 0700 Gross per 24 hour  Intake 170 ml  Output 425 ml  Net -255 ml     Assessment/Plan:  73 y.o. 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 intake; no food fear but lack of appetite May  benefit from GI consult to help with symptom management Nephrology optimizing renal function; avoiding contrast studies for now No rest pain or tissue loss L foot; no indication for revascularization of iliac at this time Dr. Donnetta Hutching will evaluate the patient later today and provide further treatment plans  Dagoberto Ligas, PA-C [facility] [phone] 01/04/2019 9:15 AM  I have examined the patient, reviewed and agree with above.  Curt Jews, MD 01/05/2019 9:02 AM

## 2019-01-04 NOTE — Plan of Care (Signed)
  Problem: Health Behavior/Discharge Planning: Goal: Ability to manage health-related needs will improve Outcome: Progressing   

## 2019-01-04 NOTE — Progress Notes (Signed)
Pharmacy Antibiotic Note  Jade Mclean is a 73 y.o. female admitted on 12/26/2018 with intra-abdominal infection.  Pharmacy has been consulted for Zosyn dosing.  ID: WBC wnl, Tmax 100.9   Recent Cholangitis; s/p cholecystectomy on 12/22/18. Scr 2.92 with CrCl<20  COVID-19 test was negative at [facility] and she denies fevers, cough, or SOB  Zosyn 6/28 >>  Plan: Zosyn 2.25g IV q 8 hrs. With CrCl<20 Pharmacy will sign off. Please reconsult for further dosing assitance.    Height: 5\' 6"  (167.6 cm) Weight: 191 lb 9.3 oz (86.9 kg) IBW/kg (Calculated) : 59.3  Temp (24hrs), Avg:99.4 F (37.4 C), Min:98.2 F (36.8 C), Max:100.9 F (38.3 C)  Recent Labs  Lab 12/30/18 0658 12/31/18 0518 01/01/19 0250 01/02/19 1049 01/03/19 0459 01/04/19 0658  WBC 5.8 6.6 7.0 6.9  --  8.0  CREATININE 3.53* 3.23* 3.13* 2.93* 3.07* 2.92*    Estimated Creatinine Clearance: 19.6 mL/min (A) (by C-G formula based on SCr of 2.92 mg/dL (H)).    Allergies  Allergen Reactions  . Colesevelam     Other reaction(s): GI Upset (intolerance)  . Dulaglutide     Other reaction(s): GI Upset (intolerance)  . Nsaids Other (See Comments)    2020: Duo ulcer with hemorrhage  . Rosuvastatin     Other reaction(s): Myalgias (intolerance)  . Simvastatin     Other reaction(s): Myalgias (intolerance)    [name] S. Alford Highland, PharmD, [place] Clinical Staff Pharmacist [place], [place] 01/04/2019 1:07 PM

## 2019-01-05 LAB — GLUCOSE, CAPILLARY
Glucose-Capillary: 168 mg/dL — ABNORMAL HIGH (ref 70–99)
Glucose-Capillary: 146 mg/dL — ABNORMAL HIGH (ref 70–99)
Glucose-Capillary: 130 mg/dL — ABNORMAL HIGH (ref 70–99)
Glucose-Capillary: 124 mg/dL — ABNORMAL HIGH (ref 70–99)

## 2019-01-05 LAB — PROTEIN ELECTROPHORESIS, SERUM
A/G Ratio: 0.8 (ref 0.7–1.7)
Albumin ELP: 2.7 g/dL — ABNORMAL LOW (ref 2.9–4.4)
Alpha-1-Globulin: 0.4 g/dL (ref 0.0–0.4)
Alpha-2-Globulin: 1 g/dL (ref 0.4–1.0)
Beta Globulin: 1 g/dL (ref 0.7–1.3)
Gamma Globulin: 1.1 g/dL (ref 0.4–1.8)
Globulin, Total: 3.4 g/dL (ref 2.2–3.9)
Total Protein ELP: 6.1 g/dL (ref 6.0–8.5)

## 2019-01-05 LAB — CBC WITH DIFFERENTIAL/PLATELET
Abs Immature Granulocytes: 0.03 10*3/uL (ref 0.00–0.07)
Basophils Absolute: 0 10*3/uL (ref 0.0–0.1)
Basophils Relative: 0 %
Eosinophils Absolute: 0.2 10*3/uL (ref 0.0–0.5)
Eosinophils Relative: 3 %
HCT: 25.2 % — ABNORMAL LOW (ref 36.0–46.0)
Hemoglobin: 8 g/dL — ABNORMAL LOW (ref 12.0–15.0)
Immature Granulocytes: 0 %
Lymphocytes Relative: 7 %
Lymphs Abs: 0.5 10*3/uL — ABNORMAL LOW (ref 0.7–4.0)
MCH: 29.1 pg (ref 26.0–34.0)
MCHC: 31.7 g/dL (ref 30.0–36.0)
MCV: 91.6 fL (ref 80.0–100.0)
Monocytes Absolute: 0.5 10*3/uL (ref 0.1–1.0)
Monocytes Relative: 8 %
Neutro Abs: 5.7 10*3/uL (ref 1.7–7.7)
Neutrophils Relative %: 82 %
Platelets: 190 10*3/uL (ref 150–400)
RBC: 2.75 MIL/uL — ABNORMAL LOW (ref 3.87–5.11)
RDW: 15.9 % — ABNORMAL HIGH (ref 11.5–15.5)
WBC: 7 10*3/uL (ref 4.0–10.5)
nRBC: 0 % (ref 0.0–0.2)

## 2019-01-05 LAB — RENAL FUNCTION PANEL
Albumin: 2.3 g/dL — ABNORMAL LOW (ref 3.5–5.0)
Anion gap: 14 (ref 5–15)
BUN: 46 mg/dL — ABNORMAL HIGH (ref 8–23)
CO2: 18 mmol/L — ABNORMAL LOW (ref 22–32)
Calcium: 8.3 mg/dL — ABNORMAL LOW (ref 8.9–10.3)
Chloride: 102 mmol/L (ref 98–111)
Creatinine, Ser: 3.03 mg/dL — ABNORMAL HIGH (ref 0.44–1.00)
GFR calc Af Amer: 17 mL/min — ABNORMAL LOW (ref >60)
GFR calc non Af Amer: 15 mL/min — ABNORMAL LOW (ref >60)
Glucose, Bld: 149 mg/dL — ABNORMAL HIGH (ref 70–99)
Phosphorus: 3.3 mg/dL (ref 2.5–4.6)
Potassium: 3.8 mmol/L (ref 3.5–5.1)
Sodium: 134 mmol/L — ABNORMAL LOW (ref 135–145)

## 2019-01-05 LAB — MPO/PR-3 (ANCA) ANTIBODIES
ANCA Proteinase 3: <3.5 U/mL (ref 0.0–3.5)
Myeloperoxidase Abs: <9 U/mL (ref 0.0–9.0)

## 2019-01-05 MED ORDER — AMOXICILLIN-POT CLAVULANATE 500-125 MG PO TABS
1.0000 | ORAL_TABLET | Freq: Two times a day (BID) | ORAL | Status: DC
  Administered 2019-01-05 – 2019-01-06 (×2): 500 mg via ORAL
  Filled 2019-01-05 (×2): qty 1

## 2019-01-05 MED ORDER — CLONIDINE HCL 0.3 MG PO TABS
0.3000 mg | ORAL_TABLET | Freq: Two times a day (BID) | ORAL | Status: DC
  Administered 2019-01-05 – 2019-01-06 (×2): 0.3 mg via ORAL
  Filled 2019-01-05 (×2): qty 1

## 2019-01-05 MED ORDER — AMOXICILLIN-POT CLAVULANATE 875-125 MG PO TABS: 1.0000 | ORAL_TABLET | Freq: Two times a day (BID) | ORAL | Status: DC

## 2019-01-05 NOTE — Progress Notes (Signed)
Occupational Therapy Treatment Patient Details Name: Jade Mclean MRN: [id number] DOB: 1945-07-16 Today's Date: 01/05/2019    History of present illness Jade Mclean is a 73 y.o. female with PMH of hypertension, paroxysmal atrial fibrillation on Eliquis,  PE, peptic ulcer disease, presenting from  [facility] ED while transferred to [facility] for management of acute kidney injury.    OT comments  Pt is not progressing toward OT goals due to complaints of pain and fatigue. Pt required Max encouragement in engage in co-treat(OT/PT); RN available as needed. Pt required Max A +2 with use of bed pad to sit to EOB and Max support to tolerate sitting up for ADL task fo washing face. Pt reports wanting to go home and not be admitted into SNF. OT/PT recommends continued therapy in SNF due to level of assist/ condition. Pt educated on the importance of rehab prior to returning home. D/C HHOT only if 24 hour support is available. OT will continue to follow acutely.    Follow Up Recommendations  SNF;Supervision/Assistance - 24 hour;Home health OT    Equipment Recommendations  3 in 1 bedside commode    Recommendations for Other Services      Precautions / Restrictions Precautions Precautions: Fall Restrictions Weight Bearing Restrictions: No       Mobility Bed Mobility Overal bed mobility: Needs Assistance Bed Mobility: Supine to Sit     Supine to sit: Max assist;+2 for physical assistance;+2 for safety/equipment;HOB elevated     General bed mobility comments: bed pad and BUE support given to patient to sit up. Pt required support from therapist to sit up right.   Transfers Overall transfer level: Needs assistance Equipment used: Rolling walker (2 wheeled) Transfers: Sit to/from Stand Sit to Stand: Total assist;+2 physical assistance         General transfer comment: trialed standing with max A +2 for 2 trials unsuccessful due to weakness.     Balance Overall balance  assessment: Needs assistance Sitting-balance support: Bilateral upper extremity supported;Feet supported Sitting balance-Leahy Scale: Poor Sitting balance - Comments: Due to fatigue and pain pt did not tolerate sitting at EOB, appeared to lean towards LUE.      Standing balance-Leahy Scale: Zero                             ADL either performed or assessed with clinical judgement   ADL Overall ADL's : Needs assistance/impaired     Grooming: Set up;Moderate assistance Grooming Details (indicate cue type and reason): Pt sat at WOb with support of OT to sit up. Pt unmotivated to wash eyes. OT provided cues, OT completed tasks due to pt's complaints of fatigue and lethargy                               General ADL Comments: transferred deferred due to weakness and inability to tolerated standing with +2 assist.      Vision       Perception     Praxis      Cognition Arousal/Alertness: Awake/alert Behavior During Therapy: WFL for tasks assessed/performed Overall Cognitive Status: Impaired/Different from baseline Area of Impairment: Orientation;Memory;Safety/judgement                 Orientation Level: Disoriented to;Place;Situation;Time(reports being in hospital for surgery.)   Memory: Decreased short-term memory   Safety/Judgement: Decreased awareness of safety;Decreased awareness of deficits  General Comments: Pt pleasantly confused required cues and reminders for engagement.         Exercises     Shoulder Instructions       General Comments SpO2 92% on 2L seated at EOB, and 92% RA at EOB.     Pertinent Vitals/ Pain       Pain Assessment: Faces Faces Pain Scale: Hurts whole lot Pain Location: BLE Pain Descriptors / Indicators: Aching;Grimacing;Tiring Pain Intervention(s): Limited activity within patient's tolerance;Monitored during session;Repositioned  Home Living                                           Prior Functioning/Environment              Frequency  Min 2X/week        Progress Toward Goals  OT Goals(current goals can now be found in the care plan section)  Progress towards OT goals: Not progressing toward goals - comment(inability to engaged in therapy due to pain and weakness.)  Acute Rehab OT Goals Patient Stated Goal: no goal stated OT Goal Formulation: Patient unable to participate in goal setting Time For Goal Achievement: 01/12/19 Potential to Achieve Goals: Fair ADL Goals Pt Will Perform Upper Body Dressing: with modified independence;sitting Pt Will Transfer to Toilet: with mod assist;bedside commode Pt Will Perform Toileting - Clothing Manipulation and hygiene: with min assist;sitting/lateral leans  Plan Discharge plan remains appropriate;Frequency remains appropriate    Co-evaluation    PT/OT/SLP Co-Evaluation/Treatment: Yes Reason for Co-Treatment: Complexity of the patient's impairments (multi-system involvement);To address functional/ADL transfers   OT goals addressed during session: ADL's and self-care;Strengthening/ROM      AM-PAC OT "6 Clicks" Daily Activity     Outcome Measure   Help from another person eating meals?: A Little Help from another person taking care of personal grooming?: A Little Help from another person toileting, which includes using toliet, bedpan, or urinal?: A Lot Help from another person bathing (including washing, rinsing, drying)?: A Lot Help from another person to put on and taking off regular upper body clothing?: A Little Help from another person to put on and taking off regular lower body clothing?: A Lot 6 Click Score: 15    End of Session Equipment Utilized During Treatment: Gait belt;Rolling walker  OT Visit Diagnosis: Unsteadiness on feet (R26.81);Muscle weakness (generalized) (M62.81)   Activity Tolerance Patient limited by pain;Patient limited by fatigue   Patient Left in bed;with call bell/phone  within reach;with nursing/sitter in room;with bed alarm set   Nurse Communication Mobility status        Time: 1219-7588 OT Time Calculation (min): 33 min  Charges: OT General Charges $OT Visit: 1 Visit OT Treatments $Self Care/Home Management : 8-22 mins  Minus Breeding, MSOT, OTR/L  [facility]  [phone]    [name] 01/05/2019, 1:41 PM

## 2019-01-05 NOTE — Progress Notes (Signed)
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 fib chads score >4-no ac? Rh arthritis-Previously Enbrel  Patient admitted [facility] Ed UTI/cholangitis-status post lap chole-2 units PRBC--AKI noted creatinine peaked 3.7-Baseline seems to be 1.8-work-up revealed atrophic right kidney---nephrology input sought-patient transferred to [place]  AKI superimposed CKD4 Mild increased in AG->see below ? Mesenteric ischemia? Atrophic right kidney, duplex renal negative for significant stenosis Defer to nephrology planning-appreciate input  Microcytic anemia in setting of large GI bleed January 2020 Abd CTA showing ? 70-99% stenosis--? Low flow state--started abx Zosyn 6/29 give AG acidosis + low grade fever without other source--Vascular input much appreciated Conflicting evidence on need for [facility] [?mainly for acute/chronic]--does not need per Dr. Donnetta Hutching any anticoagulation for this indication continue TUMS,Famotidine protonix--Her symptoms are better--has no abd pain today--would continue.   ?  Large PE in the past review of Dr. Joya Gaskins note he documents a large PE- d/c from Dr. Candiss Norse there does not appear to be any PE - was a false positive on the VQ scan from [facility] ---repeat V/Q in outpatient setting if felt necessary HTN-uncontrolled still P Afib [place] > 4 Minoxidil 2.5 twice daily, continue Imdur 30, Norvasc 10, clonidine 0.2 twice daily, amiodarone 200 MWF We will hold Eliquis for the indication of A. fib given high risk of bleeding-discussed wioth neice BMI 30, stage II partial-thickness decubitus Needs significant weight loss DM TY 2 Continue sliding scale sugars 130-168 Rheumatoid arthritis Previously on  Enbrel which is now discontinued-follow-up?  Dr. Wynetta Emery  DVT none  code Status: Full--might change to DNAR Communication: called monique-updated fully--aware of multiple issues disposition Plan: Inpatient--likely d/c tomorrow   Verlon Au, MD  [facility] [place] [email or web address] 7PM-7AM contact night coverage as above 01/05/2019, 5:45 PM  LOS: 10 days   Consultants:  Renal  Procedures:  None  Antimicrobials:  No  Interval history/Subjective:  No abd pain right now No other issues "I wanna go home"  Objective:  Vitals:  Vitals:   01/05/19 0934 01/05/19 1701  BP: (!) 191/55 (!) 195/53  Pulse: 66 71  Resp: 18 18  Temp: 98.6 F (37 C) 98.7 F (37.1 C)  SpO2: 97% 96%    Exam:  Awake alert in no distress sleepy cta b s1s2 no m/r/g Abdomen soft Cn 2-12 intact  I have personally reviewed the following:  DATA   Labs:  Hemoglobin 8.6->7.8  BUN/creatinine 3.07--->46/2.9--->46/3.0  CO2 up from 17-->20-->18  AG 16--->11-->14  Phosphorus 3.8  Imaging studies:  Duplex ultrasound  Review and summation of old records:  yes   Scheduled Meds: . sodium chloride   Intravenous Once  . amiodarone  200 mg Oral Q M,W,F  . amLODipine  10 mg Oral Daily  . calcium carbonate  400 mg of elemental calcium Oral BID WC  . cloNIDine  0.3 mg Oral BID  . famotidine  20 mg Oral Daily  . feeding supplement  1 Container Oral TID BM  . hydrALAZINE  100 mg Oral Q8H  . insulin aspart  0-5 Units Subcutaneous QHS  . insulin aspart  0-9 Units Subcutaneous TID WC  . isosorbide mononitrate  30 mg Oral Daily  . minoxidil  2.5 mg Oral BID  . multivitamin with minerals  1 tablet Oral Daily  . pantoprazole  40 mg Oral BID  . sodium bicarbonate  1,300 mg Oral BID  . sodium chloride flush  3 mL Intravenous Q12H  . sodium chloride flush  3 mL Intravenous Q12H  . sucralfate  1 g Oral TID WC & HS   Continuous Infusions: . sodium chloride    .  piperacillin-tazobactam (ZOSYN)  IV 2.25 g (01/05/19 1452)    Principal Problem:   Acute renal failure superimposed on stage 4 chronic kidney disease (HCC) Active Problems:   Type 2 diabetes mellitus without complication, without long-term current use of insulin (HCC)   PAF (paroxysmal atrial fibrillation) (HCC)   Anemia   Ascending cholangitis   Accelerated hypertension   History of pulmonary embolism   PUD (peptic ulcer disease)   LOS: 10 days

## 2019-01-05 NOTE — Plan of Care (Signed)
  Problem: Coping: Goal: Level of anxiety will decrease 01/05/2019 1057 by Arlyss Repress, RN Outcome: Progressing

## 2019-01-05 NOTE — TOC Progression Note (Addendum)
Transition of Care [facility]) - Progression Note    Patient Details  Name: Jade Mclean MRN: [id number] Date of Birth: 07-25-1945  Transition of [facility]) CM/SW Contact  Sharlet Salina Mila Homer, LCSW Phone Number: 01/05/2019, 6:22 PM  Clinical Narrative: CSW received call from niece Jade Mclean regarding patient's medical progress and discharge. Ms. [name] provided CSW with update on [name] providers: (1) [facility] (2) [facility] (3) [facility]. Per niece, patient needs a hospital bed and she already has a walker and potty chair.   Niece expressed awareness of her aunts desire to return home instead of going to a skilled nursing facility. She is interested in home health at discharge and acknowledged that they may have to private pay for additional help. Ms. [name] also requested an update on patient's medical status and was advised that MD would need to contact her.         Expected Discharge Plan: [place]    Expected Discharge Plan and Services Expected Discharge Plan: [place] arrangements for the past 2 months: Single Family Home Expected Discharge Date: 01/05/19                                     Social Determinants of Health (SDOH) Interventions  None needed at this time   Readmission Risk Interventions No flowsheet data found.

## 2019-01-05 NOTE — Progress Notes (Signed)
[facility] NEPHROLOGY PROGRESS NOTE  Assessment/ Plan: Pt is a 73 y.o. yo female with history of hypertension, A. fib on Eliquis, PE initially presented to [facility] ER on 6/12 with epigastric discomfort, N/V, found to have UTI and cholangitis.  Received antibiotics and underwent lap cholecystectomy.  Subsequently she developed severe anemia received 2 packed red blood red blood cell transfusion.  The serum creatinine level worsened to 3.7 from 2.5 on admission.  CT scan showed atrophy right kidney.  #Acute kidney injury hemodynamically mediated with ischemic insult in the setting of severe anemia and atrophic right kidney.  She has decreased renal mass.  No exact measurement of urine output however patient reports urinating good.  Creatinine level remains a stable for last few days.  Continue sodium bicarbonate.  During discussion patient would not want any aggressive measures including dialysis.  #CKD stage III/IV: The creatinine level was 1.8 in May 2020 probably due to hypertension and decrease renal mass.  Right renal atrophy suggesting underlying renovascular disease/atherosclerosis.  #Hypertension: Duplex negative for renal artery stenosis.She is currently on amlodipine, clonidine, hydralazine, Imdur, minoxidil.  Blood pressure elevated therefore increase the dose of clonidine.  Not on beta-blocker because of bradycardia.  Heart rate acceptable.  #Proteinuria probably due to hyperfiltration in the setting of atrophic right kidney.  ANA negative, kappa lambda ratio acceptable. MPO/ANCA pending  #Celiac and superior mesenteric artery stenosis: Evaluated by vascular, no intervention planned.  Recommended palliative care.  The kidney function remains stable.  I have arranged outpatient follow-up with me on 02/02/2019 at 2:45 arrival time at [facility].  I have informed this to primary team.  Sign off, call us back with question.  Subjective: Seen and examined at  bedside.  No new event.  Denies nausea, vomiting, chest pain, shortness of breath.  Reports urinating okay. Objective Vital signs in last 24 hours: Vitals:   01/04/19 2115 01/05/19 0500 01/05/19 0602 01/05/19 0934  BP: (!) 147/42  (!) 177/46 (!) 191/55  Pulse: 67  64 66  Resp: 20  19 18   Temp: 98.7 F (37.1 C)  98.3 F (36.8 C) 98.6 F (37 C)  TempSrc: Oral  Oral Oral  SpO2: 100%  94% 97%  Weight: 86.7 kg 86.7 kg    Height:       Weight change: -0.2 kg  Intake/Output Summary (Last 24 hours) at 01/05/2019 1401 Last data filed at 01/05/2019 1300 Gross per 24 hour  Intake 330 ml  Output 0 ml  Net 330 ml       Labs: Basic Metabolic Panel: Recent Labs  Lab 01/03/19 0459 01/04/19 0658 01/05/19 0543  NA 137 138 134*  K 3.9 3.9 3.8  CL 104 107 102  CO2 17* 20* 18*  GLUCOSE 151* 129* 149*  BUN 43* 46* 46*  CREATININE 3.07* 2.92* 3.03*  CALCIUM 8.6* 8.4* 8.3*  PHOS 3.8 3.8 3.3   Liver Function Tests: Recent Labs  Lab 01/03/19 0459 01/04/19 0658 01/05/19 0543  ALBUMIN 2.5* 2.2* 2.3*   No results for input(s): LIPASE, AMYLASE in the last 168 hours. No results for input(s): AMMONIA in the last 168 hours. CBC: Recent Labs  Lab 12/31/18 0518 01/01/19 0250 01/02/19 1049 01/04/19 0658 01/05/19 0543  WBC 6.6 7.0 6.9 8.0 7.0  NEUTROABS  --   --   --  6.8 5.7  HGB 7.5* 7.0* 8.6* 7.8* 8.0*  HCT 22.9* 21.7* 25.7* 23.5* 25.2*  MCV 87.4 88.9 88.6 90.0 91.6  PLT 196 195  159 169 190   Cardiac Enzymes: No results for input(s): CKTOTAL, CKMB, CKMBINDEX, TROPONINI in the last 168 hours. CBG: Recent Labs  Lab 01/04/19 1135 01/04/19 1633 01/04/19 2116 01/05/19 0657 01/05/19 1148  GLUCAP 159* 131* 134* 168* 146*    Iron Studies: No results for input(s): IRON, TIBC, TRANSFERRIN, FERRITIN in the last 72 hours. Studies/Results: Dg Chest Port 1 View  Result Date: 01/03/2019 CLINICAL DATA:  Fluid overload EXAM: PORTABLE CHEST 1 VIEW COMPARISON:  Portable exam 1819  hours compared to 07/05/2018 FINDINGS: Rotated to the LEFT. Enlargement of cardiac silhouette. Pulmonary vascular congestion. Atherosclerotic calcifications aorta. Bibasilar effusions and atelectasis greater on LEFT. Mild pulmonary edema. No pneumothorax. Bones demineralized. IMPRESSION: Mild CHF with bibasilar effusions and atelectasis. Electronically Signed   By: Lavonia Dana M.D.   On: 01/03/2019 18:51    Medications: Infusions: . sodium chloride    . piperacillin-tazobactam (ZOSYN)  IV 2.25 g (01/05/19 0744)    Scheduled Medications: . sodium chloride   Intravenous Once  . amiodarone  200 mg Oral Q M,W,F  . amLODipine  10 mg Oral Daily  . calcium carbonate  400 mg of elemental calcium Oral BID WC  . cloNIDine  0.3 mg Oral BID  . famotidine  20 mg Oral Daily  . feeding supplement  1 Container Oral TID BM  . hydrALAZINE  100 mg Oral Q8H  . insulin aspart  0-5 Units Subcutaneous QHS  . insulin aspart  0-9 Units Subcutaneous TID WC  . isosorbide mononitrate  30 mg Oral Daily  . minoxidil  2.5 mg Oral BID  . multivitamin with minerals  1 tablet Oral Daily  . pantoprazole  40 mg Oral BID  . sodium bicarbonate  1,300 mg Oral BID  . sodium chloride flush  3 mL Intravenous Q12H  . sodium chloride flush  3 mL Intravenous Q12H  . sucralfate  1 g Oral TID WC & HS    have reviewed scheduled and prn medications.  Physical Exam: General:NAD, comfortable Heart:RRR, s1s2 nl Lungs: Clear b/l, no crackle Abdomen:soft, Non-tender, non-distended Extremities: No edema Neurology: Alert, awake and following commands.   Prasad  01/05/2019,2:01 PM  LOS: 10 days  Pager: [phone]

## 2019-01-05 NOTE — Progress Notes (Signed)
  Progress Note    01/05/2019 9:02 AM * No surgery found *  Subjective: Patient states she wants to go home.  No nausea.  Not hungry.   Vitals:   01/04/19 2115 01/05/19 0602  BP: (!) 147/42 (!) 177/46  Pulse: 67 64  Resp: 20 19  Temp: 98.7 F (37.1 C) 98.3 F (36.8 C)  SpO2: 100% 94%   Physical Exam: No change in abdominal exam.  Mild diffuse tenderness.  No rebound.  CBC    Component Value Date/Time   WBC 7.0 01/05/2019 0543   RBC 2.75 (L) 01/05/2019 0543   HGB 8.0 (L) 01/05/2019 0543   HGB 10.9 (L) 10/23/2018 1020   HCT 25.2 (L) 01/05/2019 0543   HCT 24.7 (L) 12/28/2018 0538   PLT 190 01/05/2019 0543   PLT 167 10/23/2018 1020   MCV 91.6 01/05/2019 0543   MCV 88 10/23/2018 1020   MCH 29.1 01/05/2019 0543   MCHC 31.7 01/05/2019 0543   RDW 15.9 (H) 01/05/2019 0543   RDW 13.8 10/23/2018 1020   LYMPHSABS 0.5 (L) 01/05/2019 0543   MONOABS 0.5 01/05/2019 0543   EOSABS 0.2 01/05/2019 0543   BASOSABS 0.0 01/05/2019 0543    BMET    Component Value Date/Time   NA 134 (L) 01/05/2019 0543   NA 142 11/24/2018 1046   K 3.8 01/05/2019 0543   CL 102 01/05/2019 0543   CO2 18 (L) 01/05/2019 0543   GLUCOSE 149 (H) 01/05/2019 0543   BUN 46 (H) 01/05/2019 0543   BUN 18 11/24/2018 1046   CREATININE 3.03 (H) 01/05/2019 0543   CALCIUM 8.3 (L) 01/05/2019 0543   GFRNONAA 15 (L) 01/05/2019 0543   GFRAA 17 (L) 01/05/2019 0543    INR No results found for: INR   Intake/Output Summary (Last 24 hours) at 01/05/2019 0902 Last data filed at 01/05/2019 0602 Gross per 24 hour  Intake 310 ml  Output 0 ml  Net 310 ml     Assessment/Plan:  73 y.o. female no change in clinical exam.  I had a long discussion with her again today as I did yesterday.  She clearly has anatomically decreased mesenteric flow.  This was present 10 years ago on a CT CAT scan and has progressed.  Very difficult management situation.  Impossible to know how much of her abdominal issues are related to chronic  mesenteric ischemia versus her other issues with recent acute cholecystitis and cholecystectomy.  Remains afebrile.  White count normal.  No evidence of bowel infarction.  Would require imaging studies with iodinated contrast to determine what would be possible for improvement in flow.  She has complete calcification of her entire aorta iliac segments.  Clearly is not a candidate for aortobifemoral bypass and aorto SMA bypass.  Would be at high risk for endovascular treatment even if the SMA calcified stenosis would be crossable.  The patient tells me this morning that she does not want any further surgery or other aggressive treatment.  States that her age of 73, she simply wants to go home and be made comfortable.  Will defer to primary team to potentially involve palliative care for goals of care discussion.  Will follow with you     Rosetta Posner, MD [facility] [phone] 01/05/2019 9:02 AM

## 2019-01-05 NOTE — Progress Notes (Signed)
Spoke with Beckie Busing this morning and gave her updates on her aunt.

## 2019-01-05 NOTE — Progress Notes (Signed)
Physical Therapy Treatment Patient Details Name: Jade Mclean MRN: [id number] DOB: [birth year] Today's Date: 01/05/2019    History of Present Illness Jade Mclean is a 73 y.o. female with PMH of hypertension, paroxysmal atrial fibrillation on Eliquis,  PE, peptic ulcer disease, presenting from  [facility] ED while transferred to [facility] for management of acute kidney injury.     PT Comments    Continuing work on functional mobility and activity tolerance;  Jade Mclean needed Max encouragement to participate, RN came to help garner participation as pt was initially refusing to work with therapies;  Pain is limiting mobility -- currently Jade Mclean requires Max assist to sit up to EOB and attempted 2 standing trials with +2 assist without successfully coming to full standing; Unable to take steps today with +2 assist and RW;   Jade Mclean is refusing post-acute rehab, and choosing to go home; At her current functional status, PT still is recommending SNF for rehab to maximize independence and safety with mobility prior to dc home; Given refusal of SNF, must recommend:    24 hour reliable assistance;  Hospital bed;   Mechanical Lift;   Wheelchair (she may already have);   Non-emergent ambulance transport home  HHPT/OT/Aide/RN/SW    Follow Up Recommendations  SNF;Other (comment)(See above comments)     Equipment Recommendations  Wheelchair (measurements PT);Hospital bed(Mechanical Lift, Ambualnce transport home, consider drop-arm BSC)    Recommendations for Other Services       Precautions / Restrictions Precautions Precautions: Fall Restrictions Weight Bearing Restrictions: No    Mobility  Bed Mobility Overal bed mobility: Needs Assistance Bed Mobility: Supine to Sit     Supine to sit: Max assist;+2 for physical assistance;+2 for safety/equipment;HOB elevated     General bed mobility comments: bed pad and BUE support given to patient to sit up. Pt  required support from therapist to sit up right.   Transfers Overall transfer level: Needs assistance Equipment used: Rolling walker (2 wheeled) Transfers: Sit to/from Stand Sit to Stand: Max assist;+2 physical assistance         General transfer comment: trialed standing with max A +2 for 2 trials unsuccessful due to weakness.   Ambulation/Gait                 Stairs             Wheelchair Mobility    Modified Rankin (Stroke Patients Only)       Balance Overall balance assessment: Needs assistance Sitting-balance support: Bilateral upper extremity supported;Feet supported Sitting balance-Leahy Scale: Poor Sitting balance - Comments: Due to fatigue and pain pt did not tolerate sitting at EOB, appeared to lean towards LUE.      Standing balance-Leahy Scale: Zero                              Cognition Arousal/Alertness: Awake/alert Behavior During Therapy: WFL for tasks assessed/performed Overall Cognitive Status: Impaired/Different from baseline Area of Impairment: Safety/judgement                 Orientation Level: Disoriented to;Place;Situation;Time(reports being in hospital for surgery.)   Memory: Decreased short-term memory   Safety/Judgement: Decreased awareness of safety;Decreased awareness of deficits     General Comments: Pt pleasantly confused required cues and reminders for engagement.       Exercises      General Comments General comments (skin integrity, edema, etc.): SpO2 92% on 2L seated  at EOB, and 92% RA at EOB.       Pertinent Vitals/Pain Pain Assessment: Faces Faces Pain Scale: Hurts whole lot Pain Location: BLE Pain Descriptors / Indicators: Aching;Grimacing;Tiring Pain Intervention(s): Monitored during session;Repositioned    Home Living                      Prior Function            PT Goals (current goals can now be found in the care plan section) Acute Rehab PT Goals Patient Stated  Goal: to go home PT Goal Formulation: With patient Potential to Achieve Goals: Fair Progress towards PT goals: Not progressing toward goals - comment(Limited by pain)    Frequency    Min 2X/week      PT Plan Discharge plan needs to be updated;Other (comment)(pt is refusing SNF)    Co-evaluation PT/OT/SLP Co-Evaluation/Treatment: Yes Reason for Co-Treatment: Necessary to address cognition/behavior during functional activity;To address functional/ADL transfers PT goals addressed during session: Mobility/safety with mobility OT goals addressed during session: ADL's and self-care;Strengthening/ROM      AM-PAC PT "6 Clicks" Mobility   Outcome Measure  Help needed turning from your back to your side while in a flat bed without using bedrails?: A Lot Help needed moving from lying on your back to sitting on the side of a flat bed without using bedrails?: A Lot Help needed moving to and from a bed to a chair (including a wheelchair)?: A Lot Help needed standing up from a chair using your arms (e.g., wheelchair or bedside chair)?: A Lot Help needed to walk in hospital room?: Total Help needed climbing 3-5 steps with a railing? : Total 6 Click Score: 10    End of Session Equipment Utilized During Treatment: Gait belt Activity Tolerance: Patient tolerated treatment well Patient left: in bed;with call bell/phone within reach Nurse Communication: Mobility status PT Visit Diagnosis: Unsteadiness on feet (R26.81)     Time: 6389-3734 PT Time Calculation (min) (ACUTE ONLY): 37 min  Charges:  $Therapeutic Activity: 23-37 mins                     Roney Marion, PT  Acute Rehabilitation Services Pager [phone] Office [place] 01/05/2019, 3:18 PM

## 2019-01-05 NOTE — Progress Notes (Signed)
PHARMACY NOTE:  ANTIMICROBIAL RENAL DOSAGE ADJUSTMENT  Current antimicrobial regimen includes a mismatch between antimicrobial dosage and estimated renal function.  As per policy approved by the Pharmacy & Therapeutics and Medical Executive Committees, the antimicrobial dosage will be adjusted accordingly.  Current antimicrobial dosage:  Augmentin 875 mg PO Q 12 hrs  Renal Function:  Estimated Creatinine Clearance: 18.9 mL/min (A) (by C-G formula based on SCr of 3.03 mg/dL (H)). []      On intermittent HD, scheduled: []      On CRRT    Antimicrobial dosage has been changed to:  Augmentin 500 mg PO Q 12 hrs   Thank you for allowing pharmacy to be a part of this patient's care.  Gillermina Hu, PharmD, BCPS, [facility] 01/05/2019 8:12 PM

## 2019-01-06 LAB — GLUCOSE, CAPILLARY
Glucose-Capillary: 144 mg/dL — ABNORMAL HIGH (ref 70–99)
Glucose-Capillary: 112 mg/dL — ABNORMAL HIGH (ref 70–99)
Glucose-Capillary: 134 mg/dL — ABNORMAL HIGH (ref 70–99)

## 2019-01-06 MED ORDER — AMOXICILLIN-POT CLAVULANATE 500-125 MG PO TABS: 1.0000 | ORAL_TABLET | Freq: Three times a day (TID) | ORAL | 0 refills | Status: AC

## 2019-01-06 MED ORDER — CLONIDINE HCL 0.3 MG PO TABS: 0.3000 mg | ORAL_TABLET | Freq: Three times a day (TID) | ORAL | 0 refills | Status: AC

## 2019-01-06 MED ORDER — ISOSORBIDE MONONITRATE ER 30 MG PO TB24: 30.0000 mg | ORAL_TABLET | Freq: Every day | ORAL | 0 refills | Status: AC

## 2019-01-06 MED ORDER — SUCRALFATE 1 G PO TABS: 1.0000 g | ORAL_TABLET | Freq: Four times a day (QID) | ORAL | 1 refills | Status: AC

## 2019-01-06 MED ORDER — MINOXIDIL 2.5 MG PO TABS: 2.5000 mg | ORAL_TABLET | Freq: Two times a day (BID) | ORAL | 0 refills | Status: AC

## 2019-01-06 MED ORDER — SODIUM BICARBONATE 650 MG PO TABS: 1300.0000 mg | ORAL_TABLET | Freq: Two times a day (BID) | ORAL | 0 refills | Status: AC

## 2019-01-06 NOTE — Clinical Social Work Note (Addendum)
Patient medically stable for discharge and [place] services and DME requested and was to be delivered today. Niece, Jade Mclean indicated that the hospital bed will be needed before patient can come home. Ms. Jade Mclean was to call CSW when equipment arrived. Call made to Ms. Staley [phone]) at 6:04 pm to follow-up regarding DME arrival and her phone went straight to voice mail. Nurse case manager, Ellard Artis was able to reach niece and the DME has not arrive (delivery time 3 pm - 7 pm). Ms. Jade Mclean will call [phone] when the equipment arrives.   Handoff will be left regarding patient's discharge on Thursday if she does not discharge this evening. Ms. Kempen will need ambulance transport and ambulance paperwork completed and DNR in discharge packet.  Jade Mclean, MSW, LCSW Licensed Clinical Social Worker [place] [phone]

## 2019-01-06 NOTE — Progress Notes (Signed)
ADDENDUM per Roney Marion, PT  In treatment session yesterday - Pt closed eyes and noted room spinning with sitting up. When asked pt reports she has prior inner ear issues. 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 Possible follow up with HHPT.  Entered by:  Dani Gobble Migdalia Dk PT, DPT Acute Rehabilitation Services Pager [phone] Office [phone]

## 2019-01-06 NOTE — Progress Notes (Signed)
Manufacturing engineer - Palliative Care  Received request for [place] at home after discharge. Chart reviewed and spoke with niece by phone to confirm. Will make PCP office aware. Niece requesting first visit Friday or Tuesday. Will confirm once patient is home.   Thank you,  Erling Conte, LCSW [phone]

## 2019-01-06 NOTE — Discharge Summary (Signed)
Physician Discharge Summary  Corah Willeford CVE:[id number] DOB: [birth year] DOA: 12/26/2018  PCP: Algis Greenhouse, MD  Admit date: 12/26/2018 Discharge date: 01/06/2019  Time spent: 50 minutes  Recommendations for Outpatient Follow-up:  1. Patient's CODE STATUS changed to DNR 2. Patient will be going home with home health at max support with hospital bed, she already has some other equipment, will ensure that patient should be followed by palliative care in the outpatient setting given change in goals of care 3. Would recommend labs in about 1 week as a screening measure with regular physician 4. Complete antibiotics for purported low-grade ischemia in the gut-patient does not wish aggressive measures 5. Would recommend further delineation and de-escalation of meds as per patient preference 6. Will need home oxygen 2 L at all times which is a new requirement 7. If further issues with abdominal pain, may consider discussion with gastroenterology-I will CC her gastroenterologist to make him aware 8. Patient should have a follow-up with nephrologist in the outpatient setting if she changes her mind regarding need for dialysis-she is indicated to me quite clearly that she does not wish further dialysis-for academic purposes can follow [facility] in the outpatient setting 9. Shared decision making with patient regarding anticoagulation for A. fib, purported pulmonary embolism in the past, she does not wish any further anticoagulation given risk of bleed so Eliquis was discontinued 10. Follow-up with rheumatologist-consideration for other DM ARDS needs to be given given renal function  Discharge Diagnoses:  Principal Problem:   Acute renal failure superimposed on stage 4 chronic kidney disease (HCC) Active Problems:   Type 2 diabetes mellitus without complication, without long-term current use of insulin (HCC)   PAF (paroxysmal atrial fibrillation) (HCC)   Anemia   Ascending cholangitis   Accelerated  hypertension   History of pulmonary embolism   PUD (peptic ulcer disease)   Discharge Condition: Guarded  Diet recommendation: Regular  Filed Weights   01/04/19 2115 01/05/19 0500 01/06/19 0626  Weight: 86.7 kg 86.7 kg 87.4 kg    History of present illness:  73 year old female HTN, TMD by 2 not on insulin, HLD, Massive pulmonary embolism [+/-?-Conflicting evidence and cardiologist note versus discharge summary from previously] + syncope in the remote past Upper GI bleed 07/14/2018 admission-duodenal ulcers-received 4U PRBC at that time P A. fib chads score >4-no ac? Rh arthritis-Previously Enbrel  Patient admitted [facility] Ed UTI/cholangitis-status post lap chole-2 units PRBC--AKI noted creatinine peaked 3.7-Baseline seems to be 1.8-work-up revealed atrophic right kidney---nephrology input sought-patient transferred to [facility] Course:  AKI superimposed CKD4 Mild increased in AG->see below ? Mesenteric ischemia? Atrophic right kidney, duplex renal negative for significant stenosis Defer to nephrology planning-I had a long discussion with the patient on 6/31 and 7/1-she tells me she would not want dialysis If she changes her mind family can contact [facility] and they should also keep her on the radar-we will CC current inpatient nephrologist regarding the same Microcytic anemia in setting of large GI bleed January 2020 Abd CTA showing ? 70-99% stenosis--? Low flow state--started abx Zosyn 6/29 give AG acidosis + low grade fever without other source--Vascular input much appreciated  Conflicting evidence on need for [facility] [?mainly for acute/chronic]--does not need per Dr. Donnetta Hutching any anticoagulation for this indication continue TUMS,Famotidine protonix--her symptoms resolved without abdominal pain she will continue current meds and MAR with regards to ?  Large PE in the past review of Dr. Joya Gaskins note he documents a large PE- d/c from Dr. Candiss Norse there  does not appear to be any PE - was  a false positive on the VQ scan from [facility]  HTN-uncontrolled still P Afib [place] > 4 Minoxidil 2.5 twice daily, continue Imdur 30, Norvasc 10, clonidine 0.2 twice daily, amiodarone 200 MWF We will hold Eliquis for the indication of A. fib given high risk of bleeding-discussed wioth neice BMI 30, stage II partial-thickness decubitus Needs significant weight loss DM TY 2 Continue sliding scale sugars  while in hospital-use Tresiba in the outpatient setting Rheumatoid arthritis Previously on Enbrel which is now discontinued-follow-up?  Dr. Franne Forts I had a very long discussion with the patient on numerous days regarding her goals-she is clear and emphatic in stating that she is ready "to go to [facility] is live 45 years her faith is strong and she does not wish aggressive measures if she was to get worse I have ordered hospital bed DME as appropriate and a full complement of home health support and discussed with Monique her niece-she will need home oxygen and she will need follow-up from palliative care in the outpatient setting in addition should she continue to decline I would argue that she does not wish to come back to the hospital if she gets sick and we discussed this in detail  Procedures: Imaging studies:  Duplex ultrasound (i.e. Studies not automatically included, echos, thoracentesis, etc; not x-rays)  Consultations:  Vascular surgery  Discharge Exam: Vitals:   01/05/19 2004 01/06/19 0626  BP: (!) 173/85 (!) 203/57  Pulse: 67 61  Resp: 16 16  Temp: 98.3 F (36.8 C) 98.7 F (37.1 C)  SpO2: 98% 97%    General: Awake alert coherent no pain sitting up slightly on oxygen Cardiovascular: S1-S2 no murmur seems to be sinus rhythm not on monitors Respiratory: Clinically clear no added sound Abdomen soft in epigastrium no rebound no guarding Trace lower extremity edema Sacrum not examined   Discharge Instructions   Discharge Instructions    Diet - low  sodium heart healthy   Complete by: As directed    Discharge instructions   Complete by: As directed    Complete the antibiotics notice some changes to your medications--take meds carefully, and if you wish, you mayu omit vitamins/calcium etc if the pill burden is too high Check your blood pressure at home daily at the same time Follow up with primary MD in 1 week--get screening labs   Increase activity slowly   Complete by: As directed      Allergies as of 01/06/2019      Reactions   Colesevelam    Other reaction(s): GI Upset (intolerance)   Dulaglutide    Other reaction(s): GI Upset (intolerance)   Nsaids Other (See Comments)   2020: Duo ulcer with hemorrhage   Rosuvastatin    Other reaction(s): Myalgias (intolerance)   Simvastatin    Other reaction(s): Myalgias (intolerance)      Medication List    STOP taking these medications   apixaban 5 MG Tabs tablet Commonly known as: ELIQUIS   Calcium Carbonate-Vitamin D 600-400 MG-UNIT chew tablet   furosemide 40 MG tablet Commonly known as: LASIX   hydrALAZINE 50 MG tablet Commonly known as: APRESOLINE   labetalol 100 MG tablet Commonly known as: NORMODYNE   multivitamin with minerals Tabs tablet     TAKE these medications   amiodarone 200 MG tablet Commonly known as: PACERONE Take 1 tablet (200 mg) daily on Monday, Wednesday, and Friday only.   amLODipine 10 MG tablet  Commonly known as: NORVASC Take 1 tablet (10 mg total) by mouth daily.   amoxicillin-clavulanate 500-125 MG tablet Commonly known as: Augmentin Take 1 tablet (500 mg total) by mouth 3 (three) times daily.   cloNIDine 0.3 MG tablet Commonly known as: CATAPRES Take 1 tablet (0.3 mg total) by mouth 3 (three) times daily. What changed:   medication strength  how much to take  when to take this   Enbrel 25 MG injection Generic drug: etanercept Inject 25 mg into the skin every Friday.   famotidine 40 MG tablet Commonly known as:  Pepcid Take 1 tablet (40 mg total) by mouth daily.   insulin degludec 100 UNIT/ML Sopn FlexTouch Pen Commonly known as: TRESIBA Inject 12 Units into the skin daily.   isosorbide mononitrate 30 MG 24 hr tablet Commonly known as: IMDUR Take 1 tablet (30 mg total) by mouth daily.   minoxidil 2.5 MG tablet Commonly known as: LONITEN Take 1 tablet (2.5 mg total) by mouth 2 (two) times daily.   pantoprazole 40 MG tablet Commonly known as: PROTONIX Take 1 tablet (40 mg total) by mouth daily. What changed: when to take this   sodium bicarbonate 650 MG tablet Take 2 tablets (1,300 mg total) by mouth 2 (two) times daily.   sucralfate 1 g tablet Commonly known as: Carafate Take 1 tablet (1 g total) by mouth 4 (four) times daily.            Durable Medical Equipment  (From admission, onward)         Start     Ordered   01/06/19 0831  For home use only DME oxygen  Once    Question Answer Comment  Length of Need Lifetime   Mode or (Route) Nasal cannula   Liters per Minute 2   Frequency Continuous (stationary and portable oxygen unit needed)   Oxygen conserving device No   Oxygen delivery system Gas      01/06/19 0830   01/05/19 1752  For home use only DME Hospital bed  Once    Question Answer Comment  Length of Need Lifetime   The above medical condition requires: Patient requires the ability to reposition frequently   Head must be elevated greater than: 45 degrees   Bed type Semi-electric   [employer] Yes   Trapeze Bar Yes   Support Surface: Gel Overlay      01/05/19 1752         Allergies  Allergen Reactions  . Colesevelam     Other reaction(s): GI Upset (intolerance)  . Dulaglutide     Other reaction(s): GI Upset (intolerance)  . Nsaids Other (See Comments)    2020: Duo ulcer with hemorrhage  . Rosuvastatin     Other reaction(s): Myalgias (intolerance)  . Simvastatin     Other reaction(s): Myalgias (intolerance)      The results of significant  diagnostics from this hospitalization (including imaging, microbiology, ancillary and laboratory) are listed below for reference.    Significant Diagnostic Studies: Dg Chest Port 1 View  Result Date: 01/03/2019 CLINICAL DATA:  Fluid overload EXAM: PORTABLE CHEST 1 VIEW COMPARISON:  Portable exam 1819 hours compared to 07/05/2018 FINDINGS: Rotated to the LEFT. Enlargement of cardiac silhouette. Pulmonary vascular congestion. Atherosclerotic calcifications aorta. Bibasilar effusions and atelectasis greater on LEFT. Mild pulmonary edema. No pneumothorax. Bones demineralized. IMPRESSION: Mild CHF with bibasilar effusions and atelectasis. Electronically Signed   By: Lavonia Dana M.D.   On: 01/03/2019 18:51   Vas  US Renal Artery Duplex  Result Date: 01/01/2019 ABDOMINAL VISCERAL High Risk Factors: Hypertension. Limitations: Obesity, air/bowel gas and recent abdominal surgery.  Examination Guidelines: A complete evaluation includes B-mode imaging, spectral Doppler, color Doppler, and power Doppler as needed of all accessible portions of each vessel. Bilateral testing is considered an integral part of a complete examination. Limited examinations for reoccurring indications may be performed as noted.  Duplex Findings: +--------------------+--------+--------+------+--------+ Mesenteric          PSV cm/sEDV cm/sPlaqueComments +--------------------+--------+--------+------+--------+ Aorta at SMA          134      28                  +--------------------+--------+--------+------+--------+ Celiac Artery Origin  266      11                  +--------------------+--------+--------+------+--------+ SMA Proximal          511      39                  +--------------------+--------+--------+------+--------+  +------------------+--------+--------+--------------+ Right Renal ArteryPSV cm/sEDV cm/s   Comment     +------------------+--------+--------+--------------+ Origin                             not visualized +------------------+--------+--------+--------------+ Proximal                          not visualized +------------------+--------+--------+--------------+ Mid                  31      7                   +------------------+--------+--------+--------------+ Distal               30      13                  +------------------+--------+--------+--------------+ +-----------------+--------+--------+--------------+ Left Renal ArteryPSV cm/sEDV cm/s   Comment     +-----------------+--------+--------+--------------+ Origin                           not visualized +-----------------+--------+--------+--------------+ Proximal                         not visualized +-----------------+--------+--------+--------------+ Mid                 43      9                   +-----------------+--------+--------+--------------+ Distal              59      7                   +-----------------+--------+--------+--------------+ +------------+--------+--------+----+-----------+--------+--------+----+ Right KidneyPSV cm/sEDV cm/sRI  Left KidneyPSV cm/sEDV cm/sRI   +------------+--------+--------+----+-----------+--------+--------+----+ Upper Pole  16      5       0.66Upper Pole 32      5       0.84 +------------+--------+--------+----+-----------+--------+--------+----+ Mid         16      5       0.69Mid        26      4       0.83 +------------+--------+--------+----+-----------+--------+--------+----+ Lower Pole  15  6       0.59Lower Pole 29      4       0.88 +------------+--------+--------+----+-----------+--------+--------+----+ Hilar       28      6       0.79Hilar      62      9       0.86 +------------+--------+--------+----+-----------+--------+--------+----+ +------------------+----+------------------+-----+ Right Kidney          Left Kidney             +------------------+----+------------------+-----+ RAR                    RAR                     +------------------+----+------------------+-----+ RAR (manual)          RAR (manual)            +------------------+----+------------------+-----+ Cortex                Cortex                  +------------------+----+------------------+-----+ Cortex thickness      Corex thickness         +------------------+----+------------------+-----+ Kidney length (cm)8.26Kidney length (cm)11.54 +------------------+----+------------------+-----+  Summary: Renal:  Right: No evidence of right renal artery stenosis in visualized        segments. Normal right Resisitive Index. Asymmetrically        smaller kidney compared to left. Left:  No evidence of left renal artery stenosis in visualized        segments. Abnormal left Resisitve Index. 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  *See table(s) above for measurements and observations.  Diagnosing physician: Curt Jews MD  Electronically signed by Curt Jews MD on 01/01/2019 at 3:53:47 PM.    Final     Microbiology: Recent Results (from the past 240 hour(s))  Culture, blood (routine x 2)     Status: None (Preliminary result)   Collection Time: 01/03/19  7:05 PM   Specimen: BLOOD  Result Value Ref Range Status   Specimen Description BLOOD LEFT HAND  Final   Special Requests   Final    BOTTLES DRAWN AEROBIC AND ANAEROBIC Blood Culture adequate volume   Culture   Final    NO GROWTH 3 DAYS Performed at [facility], [address] [place], [place] [address]    Report Status PENDING  Incomplete  Culture, blood (routine x 2)     Status: None (Preliminary result)   Collection Time: 01/03/19  7:10 PM   Specimen: BLOOD  Result Value Ref Range Status   Specimen Description BLOOD RIGHT HAND  Final   Special Requests   Final    BOTTLES DRAWN AEROBIC AND ANAEROBIC Blood Culture adequate volume   Culture   Final    NO GROWTH 3 DAYS Performed at [facility], [place] [address], [place], [place] [address]    Report Status PENDING  Incomplete     Labs: Basic Metabolic Panel: Recent Labs  Lab 01/01/19 0250 01/02/19 1049 01/03/19 0459 01/04/19 0658 01/05/19 0543  NA 138 138 137 138 134*  K 4.0 3.7 3.9 3.9 3.8  CL 110 109 104 107 102  CO2 17* 20* 17* 20* 18*  GLUCOSE 104* 119* 151* 129* 149*  BUN 41* 42* 43* 46* 46*  CREATININE 3.13* 2.93* 3.07* 2.92* 3.03*  CALCIUM 8.3* 8.2* 8.6*  8.4* 8.3*  PHOS  --  3.7 3.8 3.8 3.3   Liver Function Tests: Recent Labs  Lab 01/02/19 1049 01/03/19 0459 01/04/19 0658 01/05/19 0543  ALBUMIN 2.3* 2.5* 2.2* 2.3*   No results for input(s): LIPASE, AMYLASE in the last 168 hours. No results for input(s): AMMONIA in the last 168 hours. CBC: Recent Labs  Lab 12/31/18 0518 01/01/19 0250 01/02/19 1049 01/04/19 0658 01/05/19 0543  WBC 6.6 7.0 6.9 8.0 7.0  NEUTROABS  --   --   --  6.8 5.7  HGB 7.5* 7.0* 8.6* 7.8* 8.0*  HCT 22.9* 21.7* 25.7* 23.5* 25.2*  MCV 87.4 88.9 88.6 90.0 91.6  PLT 196 195 159 169 190   Cardiac Enzymes: No results for input(s): CKTOTAL, CKMB, CKMBINDEX, TROPONINI in the last 168 hours. BNP: BNP (last 3 results) No results for input(s): BNP in the last 8760 hours.  ProBNP (last 3 results) Recent Labs    10/23/18 1020  PROBNP 1,732*    CBG: Recent Labs  Lab 01/05/19 0657 01/05/19 1148 01/05/19 1658 01/05/19 2046 01/06/19 0645  GLUCAP 168* 146* 130* 124* 144*       Signed:  Nita Sells MD   [facility] 01/06/2019, 8:23 AM

## 2019-01-06 NOTE — TOC Progression Note (Signed)
Transition of Care [facility]) - Progression Note    Patient Details  Name: Jade Mclean MRN: [id number] Date of Birth: 24-Aug-1945  Transition [facility]) CM/SW Contact  Bartholomew Crews, RN Phone Number: [phone] 01/06/2019, 3:46 PM  Clinical Narrative:    Spoke with patient's niece, Beckie Busing, about hospice referral with [facility] d/t patient resides in [place]. Beckie Busing is agreeable to referral.   Patient to transition home today after hospital bed and lift delivered. Monique to call to advise of delivery.    Expected Discharge Plan: [place]    Expected Discharge Plan and Services Expected Discharge Plan: [place] arrangements for the past 2 months: Single Family Home Expected Discharge Date: 01/06/19                                     Social Determinants of Health (SDOH) Interventions    Readmission Risk Interventions No flowsheet data found.

## 2019-01-06 NOTE — Progress Notes (Signed)
Length of Need Lifetime   The above medical condition requires: Patient requires the ability to reposition frequently   Head must be elevated greater than: 45 degrees   Bed type Semi-electric   Hoyer Lift Yes   Trapeze Bar Yes   Support Surface: Gel Overlay    Manya Silvas RN MSN CCM Transitions of Care [phone]

## 2019-01-06 NOTE — Progress Notes (Signed)
DISCHARGE NOTE HOME Jade Mclean to be discharged home per MD order. Discussed prescriptions and follow up appointments with the patient and patient niece. Prescriptions given to patient; medication list explained in detail. Patient/family verbalized understanding.  Stage 2 on left buttock, area clean and foam applied. IV catheter discontinued intact. Site without signs and symptoms of complications. Dressing and pressure applied. Pt denies pain at the site currently. No complaints noted.  Patient free of lines, drains, and wounds.   An After Visit Summary (AVS) was printed and given to the patient. Patient escorted via [facility]ar, home.   Rushie Goltz, RN

## 2019-01-08 LAB — CULTURE, BLOOD (ROUTINE X 2)
Culture: NO GROWTH
Culture: NO GROWTH
Special Requests: ADEQUATE
Special Requests: ADEQUATE

## 2019-01-08 NOTE — Progress Notes (Signed)
Received call from [facility] asking about f/u with [facility]. After researching situation with [facility] liaison. CM called Monique back to advise that one one of the conversations that she had yesterday was with the [facility] RN and that it was determined that patient's needs can best be met by [facility]. Monique expressed appreciation for clarification. She will f/u with [facility] for ongoing care.   Manya Silvas, RN MSN CCM Transitions of Care [phone]

## 2019-01-20 ENCOUNTER — Telehealth: Payer: Self-pay | Admitting: Cardiology

## 2019-01-20 NOTE — Telephone Encounter (Signed)
Patient's POA, Gordy Councilman called to cancel patient's August appt and let Dr Bettina Gavia know the patient is currently under the care of Hospice

## 2019-02-06 DEATH — deceased

## 2019-03-04 ENCOUNTER — Ambulatory Visit: Payer: Medicare Other | Admitting: Cardiology

## 2019-07-20 ENCOUNTER — Telehealth: Payer: Self-pay | Admitting: Cardiology

## 2019-07-20 NOTE — Telephone Encounter (Signed)
States she faxed over some paper work last week for [facility] to complete but hasn't heard anything

## 2019-07-20 NOTE — Telephone Encounter (Signed)
To his paperwork regarding insurance policy of her mother.  Should have been sent to [facility] will drop it off next time I am in the office 9 AM in [place] this week

## 2019-07-30 NOTE — Telephone Encounter (Signed)
  New Message:  Ms [name] is calling back to follow up on paperwork for the patients insurance that was discussed in previous note by Dr Bettina Gavia. Ms [name] states that she has not heard anything and would like to know the status.

## 2019-08-06 NOTE — Telephone Encounter (Signed)
Follow up    Jade Mclean is returning call again regarding paper work     Please Advise

## 2019-08-06 NOTE — Telephone Encounter (Signed)
Patient neice called per dpr. She is very upset and wants to know where the paperwork is that she dropped off. After discussing with Lattie Haw front office rep in [place] she reports that Dr. Bettina Gavia said he will not sign that as it is not [facility]a paperwork. I informed the niece of this she verbally understood. No further questions.

## 2019-08-06 NOTE — Telephone Encounter (Signed)
It should be sent to [facility]

## 2019-08-06 NOTE — Telephone Encounter (Signed)
Called patient's niece. Informed her per Edmon Crape that her paperwork was at the HIM department and she would need to contact them. This is not for cioxx. I gave her the number and told her to call me back if they were not able to help her.

## 2019-08-13 ENCOUNTER — Telehealth: Payer: Self-pay | Admitting: Cardiology

## 2019-08-13 NOTE — Telephone Encounter (Signed)
New message:    Patient niece calling stating that you where helping her with some records. Please call her back.

## 2019-08-13 NOTE — Telephone Encounter (Signed)
Called patient niece gave her number for medical record department again.

## 2020-03-21 IMAGING — DX DG CHEST 1V PORT
1 series · 1 of 1 positions shown · non-contrast
Comparison: Chest radiograph from one day prior.

CLINICAL DATA: Acute respiratory distress

EXAM:
PORTABLE CHEST 1 VIEW

[chest]
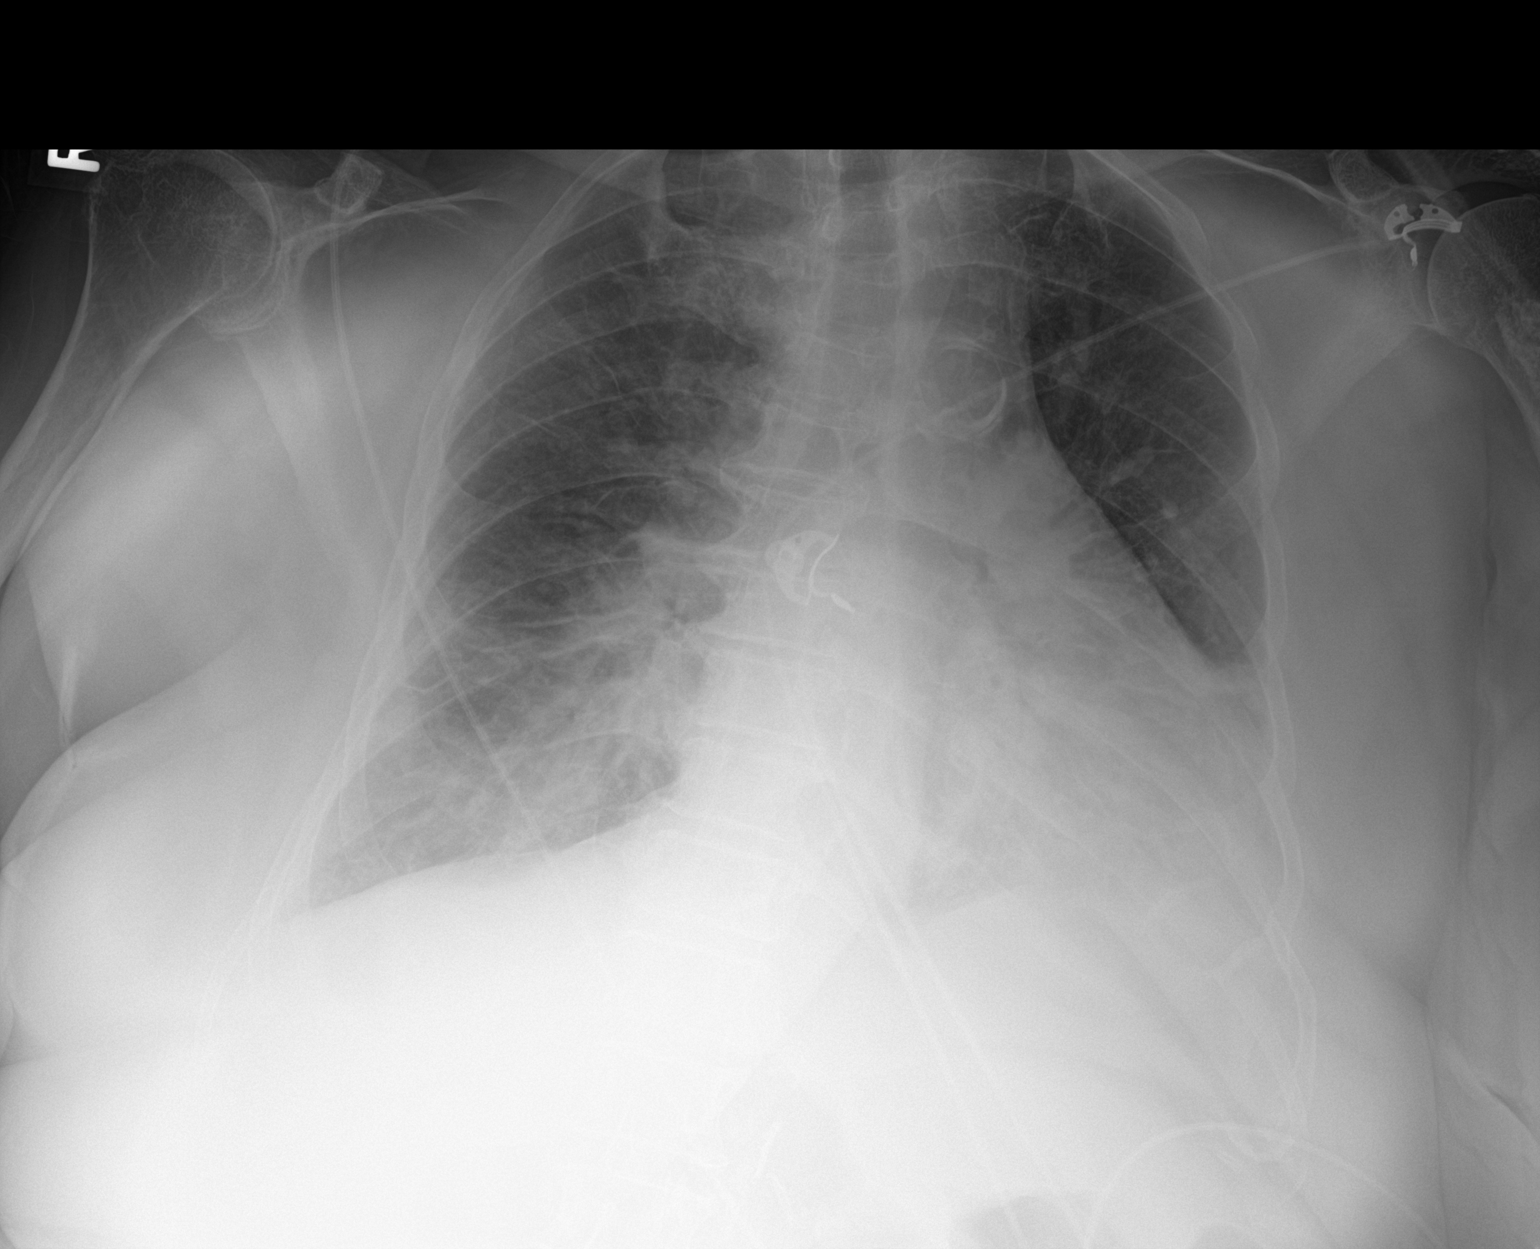

[1 of 1 positions shown; findings below may reference images not displayed]

FINDINGS: Stable cardiomediastinal silhouette with mild cardiomegaly. No
pneumothorax. No right pleural effusion. Stable blunting of the left
costophrenic angle. Mild-to-moderate pulmonary appears slightly
worsened.
IMPRESSION: Mild-to-moderate congestive heart failure, slightly worsened. Stable
small left pleural effusion.
